# Patient Record
Sex: Male | Born: 1950 | Race: White | Hispanic: No | Marital: Married | Smoking: Never smoker
Health system: Southern US, Community
[De-identification: ages and names within clinical notes are randomized; demographics above are authoritative.]

## PROBLEM LIST (undated history)

## (undated) DIAGNOSIS — T148XXA Other injury of unspecified body region, initial encounter: Secondary | ICD-10-CM

## (undated) DIAGNOSIS — M1990 Unspecified osteoarthritis, unspecified site: Secondary | ICD-10-CM

## (undated) DIAGNOSIS — G4733 Obstructive sleep apnea (adult) (pediatric): Secondary | ICD-10-CM

## (undated) DIAGNOSIS — E785 Hyperlipidemia, unspecified: Secondary | ICD-10-CM

## (undated) DIAGNOSIS — K219 Gastro-esophageal reflux disease without esophagitis: Secondary | ICD-10-CM

## (undated) DIAGNOSIS — K222 Esophageal obstruction: Secondary | ICD-10-CM

## (undated) DIAGNOSIS — N200 Calculus of kidney: Secondary | ICD-10-CM

## (undated) DIAGNOSIS — G4730 Sleep apnea, unspecified: Secondary | ICD-10-CM

## (undated) DIAGNOSIS — I10 Essential (primary) hypertension: Secondary | ICD-10-CM

## (undated) HISTORY — DX: Esophageal obstruction: K22.2

## (undated) HISTORY — DX: Essential (primary) hypertension: I10

## (undated) HISTORY — DX: Hyperlipidemia, unspecified: E78.5

## (undated) HISTORY — DX: Sleep apnea, unspecified: G47.30

## (undated) HISTORY — PX: OTHER SURGICAL HISTORY: SHX169

## (undated) HISTORY — DX: Unspecified osteoarthritis, unspecified site: M19.90

## (undated) HISTORY — DX: Calculus of kidney: N20.0

## (undated) HISTORY — DX: Other injury of unspecified body region, initial encounter: T14.8XXA

## (undated) HISTORY — DX: Obstructive sleep apnea (adult) (pediatric): G47.33

---

## 1977-12-26 HISTORY — PX: KIDNEY STONE SURGERY: SHX686

## 2004-11-04 ENCOUNTER — Emergency Department (HOSPITAL_COMMUNITY): Admission: EM | Admit: 2004-11-04 | Discharge: 2004-11-04 | Payer: Self-pay | Admitting: Emergency Medicine

## 2008-12-26 HISTORY — PX: ESOPHAGUS SURGERY: SHX626

## 2010-09-16 ENCOUNTER — Encounter (INDEPENDENT_AMBULATORY_CARE_PROVIDER_SITE_OTHER): Payer: Self-pay | Admitting: *Deleted

## 2010-09-29 ENCOUNTER — Encounter (INDEPENDENT_AMBULATORY_CARE_PROVIDER_SITE_OTHER): Payer: Self-pay | Admitting: *Deleted

## 2010-09-30 ENCOUNTER — Encounter (INDEPENDENT_AMBULATORY_CARE_PROVIDER_SITE_OTHER): Payer: Self-pay | Admitting: *Deleted

## 2010-09-30 ENCOUNTER — Ambulatory Visit: Payer: Self-pay | Admitting: Internal Medicine

## 2010-10-14 ENCOUNTER — Ambulatory Visit: Payer: Self-pay | Admitting: Internal Medicine

## 2010-10-14 HISTORY — PX: COLONOSCOPY: SHX174

## 2010-10-18 ENCOUNTER — Encounter: Payer: Self-pay | Admitting: Internal Medicine

## 2011-01-27 NOTE — Procedures (Signed)
Summary: Colonoscopy  Patient: [name] Note: All result statuses are Final unless otherwise noted.  Tests: (1) Colonoscopy (COL)   COL Colonoscopy           DONE     [facility]     [address]     [place], [place]  [address]           COLONOSCOPY PROCEDURE REPORT           PATIENT:  Austin Ortiz, Austin Ortiz  MR#:  [id number]     BIRTHDATE:  26-May-1951, 59 yrs. old  GENDER:  male     ENDOSCOPIST:  Hedwig Morton. Juanda Chance, MD     REF. BY:  Rudi Heap, M.D.     PROCEDURE DATE:  10/14/2010     PROCEDURE:  Colonoscopy 34742     ASA CLASS:  Class II     INDICATIONS:  Routine Risk Screening     MEDICATIONS:   Versed 9 mg, Fentanyl 75 mcg           DESCRIPTION OF PROCEDURE:   After the risks benefits and     alternatives of the procedure were thoroughly explained, informed     consent was obtained.  Digital rectal exam was performed and     revealed no rectal masses.   The LB [id number]-[id number] endoscope     was introduced through the anus and advanced to the cecum, which     was identified by both the appendix and ileocecal valve, without     limitations.  The quality of the prep was good, using MoviPrep.     The instrument was then slowly withdrawn as the colon was fully     examined.     <<PROCEDUREIMAGES>>           FINDINGS:  A sessile polyp was found at the ileocecal valve.     unsure if it is polyp or a thickened fold With standard forceps,     biopsy was obtained and sent to pathology (see image1 and image2).     This was otherwise a normal examination of the colon (see image1,     image3, and image4).   Retroflexed views in the rectum revealed no     abnormalities.    The scope was then withdrawn from the patient     and the procedure completed.           COMPLICATIONS:  None     ENDOSCOPIC IMPRESSION:     1) Sessile polyp at the ileocecal valve     2) Otherwise normal examination     unsure if it is a polyp or a thickened fold     RECOMMENDATIONS:     1) Await  biopsy results     2) High fiber diet.     REPEAT EXAM:  In 10 year(s) for.           ______________________________     Hedwig Morton. Juanda Chance, MD           CC:           n.     eSIGNED:   Hedwig Morton. Brodie at 10/14/2010 03:12 PM           Gwynneth Macleod, [id number]  Note: An exclamation mark (!) indicates a result that was not dispersed into the flowsheet. Document Creation Date: 10/14/2010 3:12 PM _______________________________________________________________________  (1) Order result status: Final Collection or observation date-time: 10/14/2010 15:06 Requested date-time:  Receipt date-time:  Reported date-time:  Referring Physician:   Ordering Physician: Lina Sar [phone]) Specimen Source:  Source: Launa Grill Order Number: [id number] Lab site:   Appended Document: Colonoscopy     Procedures Next Due Date:    Colonoscopy: 10/2020

## 2011-01-27 NOTE — Letter (Signed)
Summary: [facility] - Inpatient Instructions  [facility]  [address] [place], [place] [address]   Phone: [phone]  Fax: [phone]       MOURAD CWIKLA    10/06/1951    MRN: [id number]        Procedure Day /Date: Thursday 10-14-10     Arrival Time: 1:00 p.m.     Procedure Time: 2:00 p.m.     Location of Procedure:                     [facility] (4th Floor)   PREPARATION FOR COLONOSCOPY WITH MOVIPREP   Starting 5 days prior to your procedure  10-09-10  do not eat nuts, seeds, popcorn, corn, beans, peas,  salads, or any raw vegetables.  Do not take any fiber supplements (e.g. Metamucil, Citrucel, and Benefiber).  THE DAY BEFORE YOUR PROCEDURE         DATE:  10-13-10   DAY:  Wednesday  1.  Drink clear liquids the entire day-NO SOLID FOOD  2.  Do not drink anything colored red or purple.  Avoid juices with pulp.  No orange juice.  3.  Drink at least 64 oz. (8 glasses) of fluid/clear liquids during the day to prevent dehydration and help the prep work efficiently.  CLEAR LIQUIDS INCLUDE: Water Jello Ice Popsicles Tea (sugar ok, no milk/cream) Powdered fruit flavored drinks Coffee (sugar ok, no milk/cream) Gatorade Juice: apple, white grape, white cranberry  Lemonade Clear bullion, consomm, broth Carbonated beverages (any kind) Strained chicken noodle soup Hard Candy                             4.  In the morning, mix first dose of MoviPrep solution:    Empty 1 Pouch A and 1 Pouch B into the disposable container    Add lukewarm drinking water to the top line of the container. Mix to dissolve    Refrigerate (mixed solution should be used within 24 hrs)  5.  Begin drinking the prep at 5:00 p.m. The MoviPrep container is divided by 4 marks.   Every 15 minutes drink the solution down to the next mark (approximately 8 oz) until the full liter is complete.   6.  Follow completed prep with 16 oz of clear liquid of your choice (Nothing red or  purple).  Continue to drink clear liquids until bedtime.  7.  Before going to bed, mix second dose of MoviPrep solution:    Empty 1 Pouch A and 1 Pouch B into the disposable container    Add lukewarm drinking water to the top line of the container. Mix to dissolve    Refrigerate  THE DAY OF YOUR PROCEDURE      DATE:  10-14-10  DAY:  Thursday  Beginning at  9:00 a.m. (5 hours before procedure):         1. Every 15 minutes, drink the solution down to the next mark (approx 8 oz) until the full liter is complete.  2. Follow completed prep with 16 oz. of clear liquid of your choice.    3. You may drink clear liquids until 12:00 p.m.  (2 HOURS BEFORE PROCEDURE).   MEDICATION INSTRUCTIONS  Unless otherwise instructed, you should take regular prescription medications with a small sip of water   as early as possible the morning of your procedure.  Diabetic patients - see separate instructions.  Stop taking Plavix  or Aggrenox on  _  _  (7 days before procedure).     Stop taking Coumadin on  _ _  (5 days before procedure).  Additional medication instructions: _         OTHER INSTRUCTIONS  You will need a responsible adult at least 59 years of age to accompany you and drive you home.   This person must remain in the waiting room during your procedure.  Wear loose fitting clothing that is easily removed.  Leave jewelry and other valuables at home.  However, you may wish to bring a book to read or  an iPod/MP3 player to listen to music as you wait for your procedure to start.  Remove all body piercing jewelry and leave at home.  Total time from sign-in until discharge is approximately 2-3 hours.  You should go home directly after your procedure and rest.  You can resume normal activities the  day after your procedure.  The day of your procedure you should not:   Drive   Make legal decisions   Operate machinery   Drink alcohol   Return to work  You will receive  specific instructions about eating, activities and medications before you leave.    The above instructions have been reviewed and explained to me by   _______________________    I fully understand and can verbalize these instructions _____________________________ Date _________

## 2011-01-27 NOTE — Miscellaneous (Signed)
Summary: [facility] PREP  Clinical Lists Changes  Medications: Added new medication of MOVIPREP 100 GM  SOLR (PEG-KCL-NACL-NASULF-NA ASC-C) As per prep instructions. - Signed Rx of MOVIPREP 100 GM  SOLR (PEG-KCL-NACL-NASULF-NA ASC-C) As per prep instructions.;  #1 x 0;  Signed;  Entered by: Karl Bales RN;  Authorized by: Hart Carwin MD;  Method used: Electronically to [facility] [address], [place], [place], [place]  [address], Ph: [phone], Fax: [phone] Observations: Added new observation of NKA: T (09/30/2010 9:53)    Prescriptions: MOVIPREP 100 GM  SOLR (PEG-KCL-NACL-NASULF-NA ASC-C) As per prep instructions.  #1 x 0   Entered by:   Karl Bales RN   Authorized by:   Hart Carwin MD   Signed by:   Karl Bales RN on 09/30/2010   Method used:   Electronically to        [facility] [address] (retail)       [address]       [place], [place]  [address]       Ph: [phone]       Fax: [phone]   RxID:   [id number]   Appended Document: MIRALAX PREP Pt had Moviprep sent to pharmacy, not Moviprep

## 2011-01-27 NOTE — Letter (Signed)
Summary: Diabetic Instructions  [facility]  [address]   [place], [place] [address]   Phone: [phone]  Fax: [phone]    Austin Ortiz 08/06/1951 MRN: [id number]   x     ORAL DIABETIC MEDICATION INSTRUCTIONS  The day before your procedure:   Take your diabetic pill as you do normally  The day of your procedure:   Do not take your diabetic pill    We will check your blood sugar levels during the admission process and again in Recovery before discharging you home  ________________________________________________________________________

## 2011-01-27 NOTE — Letter (Signed)
Summary: Patient Notice- Polyp Results  [facility]  [address] [place], [place] [address]   Phone: [phone]  Fax: [phone]        October 18, 2010 MRN: [id number]    [facility] [address] [place], [place]  [address]    Dear [name],  I am pleased to inform you that the colon polyp(s) removed during your recent colonoscopy was (were) found to be benign (no cancer detected) upon pathologic examination.The polyp consisted of a normal colon tissue,( not precancerous)   I recommend you have a repeat colonoscopy examination in 10_ years to look for recurrent polyps, as having colon polyps increases your risk for having recurrent polyps or even colon cancer in the future.  Should you develop new or worsening symptoms of abdominal pain, bowel habit changes or bleeding from the rectum or bowels, please schedule an evaluation with either your primary care physician or with me.  Additional information/recommendations:  _x_ No further action with gastroenterology is needed at this time. Please      follow-up with your primary care physician for your other healthcare      needs.  __ Please call [phone] to schedule a return visit to review your      situation.  __ Please keep your follow-up visit as already scheduled.  __ Continue treatment plan as outlined the day of your exam.  Please call us if you are having persistent problems or have questions about your condition that have not been fully answered at this time.  Sincerely,  Hart Carwin MD  This letter has been electronically signed by your physician.  Appended Document: Patient Notice- Polyp Results letter mailed

## 2011-01-27 NOTE — Letter (Signed)
Summary: Pre Visit Letter Revised  [facility]  [address] [place], [place] [address]   Phone: [phone]  Fax: [phone]        09/16/2010 MRN: [id number]   [facility] [address] [place], [place]  [address]              Welcome to the Gastroenterology Division at [facility]    You are scheduled to see a nurse for your pre-procedure visit on September 30, 2010 at 9:30am on the 3rd floor at [facility], [address]  We ask that you try to arrive at our office 15 minutes prior to your appointment time to allow for check-in.  Please take a minute to review the attached form.  If you answer "Yes" to one or more of the questions on the first page, we ask that you call the person listed at your earliest opportunity.  If you answer "No" to all of the questions, please complete the rest of the form and bring it to your appointment.    Your nurse visit will consist of discussing your medical and surgical history, your immediate family medical history, and your medications.   If you are unable to list all of your medications on the form, please bring the medication bottles to your appointment and we will list them.  We will need to be aware of both prescribed and over the counter drugs.  We will need to know exact dosage information as well.    Please be prepared to read and sign documents such as consent forms, a financial agreement, and acknowledgement forms.  If necessary, and with your consent, a friend or relative is welcome to sit-in on the nurse visit with you.  Please bring your insurance card so that we may make a copy of it.  If your insurance requires a referral to see a specialist, please bring your referral form from your primary care physician.  No co-pay is required for this nurse visit.     If you cannot keep your appointment, please call [phone] to cancel or reschedule prior to your appointment date.  This allows [place] the opportunity  to schedule an appointment for another patient in need of care.    Thank you for choosing [facility] for your medical needs.  We appreciate the opportunity to care for you.  Please visit [place] at our website  to learn more about our practice.  Sincerely, The Gastroenterology Division

## 2011-03-09 LAB — GLUCOSE, CAPILLARY

## 2011-05-27 ENCOUNTER — Encounter: Payer: Self-pay | Admitting: Physician Assistant

## 2013-04-15 ENCOUNTER — Telehealth: Payer: Self-pay | Admitting: Physician Assistant

## 2013-04-15 DIAGNOSIS — R1310 Dysphagia, unspecified: Secondary | ICD-10-CM

## 2013-04-16 NOTE — Telephone Encounter (Signed)
Please advise.  Need an appointment or could you do a referral based on the symptoms? Austin Ortiz was prior provider. Thanks.

## 2013-04-17 NOTE — Telephone Encounter (Signed)
Can we do this referral without pt being seen- dr Jarold Motto gi dwm to address due to this being a [facility] pt

## 2013-04-18 ENCOUNTER — Encounter: Payer: Self-pay | Admitting: *Deleted

## 2013-04-18 ENCOUNTER — Ambulatory Visit (INDEPENDENT_AMBULATORY_CARE_PROVIDER_SITE_OTHER): Payer: BC Managed Care – PPO | Admitting: Gastroenterology

## 2013-04-18 ENCOUNTER — Telehealth: Payer: Self-pay | Admitting: Internal Medicine

## 2013-04-18 VITALS — BP 130/70 | HR 80 | Ht 65.75 in | Wt 217.4 lb

## 2013-04-18 DIAGNOSIS — R1319 Other dysphagia: Secondary | ICD-10-CM

## 2013-04-18 NOTE — Progress Notes (Signed)
Reviewed and agree with EGD/dil for solid food dysphagia. Colon 2011- polypoid mucosa, recall 10 years

## 2013-04-18 NOTE — Progress Notes (Signed)
04/18/2013 Austin Ortiz [id number] 09-08-1951   History of Present Illness:  Patient is a pleasant 62 year old male who presents to our office today with complaints of solid food dysphagia.  Says that it has been occurring for quite some time but has become much more frequent.  Says that it used to feel like it was slow to go down, but now it actually gets hung up before moving into his stomach.  Says that he can usually walk around and it will eventually move, but Sunday he ate some ham and it was stuck for 3 hours before it finally passed.  Does not have a problem swallowing liquids alone.  Does not have to vomit or bring food back up.  Does not choke or cough.  Current Medications, Allergies, Past Medical History, Past Surgical History, Family History and Social History were reviewed in [employer] record.   Physical Exam: BP 130/70  Pulse 80  Ht 5' 5.75" (1.67 m)  Wt 217 lb 6.4 oz (98.612 kg)  BMI 35.36 kg/m2 General: Well developed, male in no acute distress Head: Normocephalic and atraumatic Eyes:  sclerae anicteric, conjunctiva pink  Ears: Normal auditory acuity Lungs: Clear throughout to auscultation Heart: Regular rate and rhythm Abdomen: Soft, non-tender and non-distended. No masses, no hepatomegaly. Normal bowel sounds. Musculoskeletal: Symmetrical with no gross deformities  Extremities: No edema  Neurological: Alert oriented x 4, grossly nonfocal Psychological:  Alert and cooperative. Normal mood and affect  Assessment and Recommendations: -Dysphagia, mostly to solid food.  Rule out esophageal stricture vs Schatzki's ring vs EoE vs malignancy and other etiologies.  Will schedule EGD with possible dilation.

## 2013-04-18 NOTE — Patient Instructions (Addendum)
You have been scheduled for an endoscopy with propofol. Please follow written instructions given to you at your visit today. If you use inhalers (even only as needed), please bring them with you on the day of your procedure. Your physician has requested that you go to www.startemmi.com and enter the access code given to you at your visit today. This web site gives a general overview about your procedure. However, you should still follow specific instructions given to you by our office regarding your preparation for the procedure.  NO ORAL DIABETIC MEDS THE MORNING OF THE PROCEDURE

## 2013-04-18 NOTE — Telephone Encounter (Signed)
Scheduled patient today with Doug Sou, PA at 3:30 PM.

## 2013-04-18 NOTE — Telephone Encounter (Signed)
Left a message for Carlon to call me

## 2013-04-19 ENCOUNTER — Encounter: Payer: Self-pay | Admitting: Internal Medicine

## 2013-04-19 ENCOUNTER — Other Ambulatory Visit: Payer: Self-pay | Admitting: Internal Medicine

## 2013-04-19 ENCOUNTER — Ambulatory Visit (AMBULATORY_SURGERY_CENTER): Payer: BC Managed Care – PPO | Admitting: Internal Medicine

## 2013-04-19 VITALS — BP 127/75 | HR 62 | Temp 98.0°F | Resp 17 | Ht 66.0 in | Wt 217.0 lb

## 2013-04-19 DIAGNOSIS — K222 Esophageal obstruction: Secondary | ICD-10-CM

## 2013-04-19 DIAGNOSIS — K2100 Gastro-esophageal reflux disease with esophagitis, without bleeding: Secondary | ICD-10-CM

## 2013-04-19 DIAGNOSIS — K219 Gastro-esophageal reflux disease without esophagitis: Secondary | ICD-10-CM

## 2013-04-19 DIAGNOSIS — R1319 Other dysphagia: Secondary | ICD-10-CM

## 2013-04-19 DIAGNOSIS — K209 Esophagitis, unspecified without bleeding: Secondary | ICD-10-CM

## 2013-04-19 MED ORDER — OMEPRAZOLE 40 MG PO CPDR: 40.0000 mg | DELAYED_RELEASE_CAPSULE | Freq: Every day | ORAL | Status: DC

## 2013-04-19 MED ORDER — SODIUM CHLORIDE 0.9 % IV SOLN: 500.0000 mL | INTRAVENOUS | Status: DC

## 2013-04-19 NOTE — Progress Notes (Signed)
Patient did not experience any of the following events: a burn prior to discharge; a fall within the facility; wrong site/side/patient/procedure/implant event; or a hospital transfer or hospital admission upon discharge from the facility. (G8907) Patient did not have preoperative order for IV antibiotic SSI prophylaxis. (G8918)  

## 2013-04-19 NOTE — Patient Instructions (Addendum)
Discharge instructions given with verbal understanding. Handouts on a hiatal hernia,stricture,esophagitis, and a dilatation diet given. Resume previous medications. YOU HAD AN ENDOSCOPIC PROCEDURE TODAY AT THE [facility]: Refer to the procedure report that was given to you for any specific questions about what was found during the examination.  If the procedure report does not answer your questions, please call your gastroenterologist to clarify.  If you requested that your care partner not be given the details of your procedure findings, then the procedure report has been included in a sealed envelope for you to review at your convenience later.  YOU SHOULD EXPECT: Some feelings of bloating in the abdomen. Passage of more gas than usual.  Walking can help get rid of the air that was put into your GI tract during the procedure and reduce the bloating. If you had a lower endoscopy (such as a colonoscopy or flexible sigmoidoscopy) you may notice spotting of blood in your stool or on the toilet paper. If you underwent a bowel prep for your procedure, then you may not have a normal bowel movement for a few days.  DIET: Your first meal following the procedure should be a light meal and then it is ok to progress to your normal diet.  A half-sandwich or bowl of soup is an example of a good first meal.  Heavy or fried foods are harder to digest and may make you feel nauseous or bloated.  Likewise meals heavy in dairy and vegetables can cause extra gas to form and this can also increase the bloating.  Drink plenty of fluids but you should avoid alcoholic beverages for 24 hours.  ACTIVITY: Your care partner should take you home directly after the procedure.  You should plan to take it easy, moving slowly for the rest of the day.  You can resume normal activity the day after the procedure however you should NOT DRIVE or use heavy machinery for 24 hours (because of the sedation medicines used during the  test).    SYMPTOMS TO REPORT IMMEDIATELY: A gastroenterologist can be reached at any hour.  During normal business hours, 8:30 AM to 5:00 PM Monday through Friday, call [phone].  After hours and on weekends, please call the GI answering service at [phone] who will take a message and have the physician on call contact you.   Following upper endoscopy (EGD)  Vomiting of blood or coffee ground material  New chest pain or pain under the shoulder blades  Painful or persistently difficult swallowing  New shortness of breath  Fever of 100F or higher  Black, tarry-looking stools  FOLLOW UP: If any biopsies were taken you will be contacted by phone or by letter within the next 1-3 weeks.  Call your gastroenterologist if you have not heard about the biopsies in 3 weeks.  Our staff will call the home number listed on your records the next business day following your procedure to check on you and address any questions or concerns that you may have at that time regarding the information given to you following your procedure. This is a courtesy call and so if there is no answer at the home number and we have not heard from you through the emergency physician on call, we will assume that you have returned to your regular daily activities without incident.  SIGNATURES/CONFIDENTIALITY: You and/or your care partner have signed paperwork which will be entered into your electronic medical record.  These signatures attest to the fact  that that the information above on your After Visit Summary has been reviewed and is understood.  Full responsibility of the confidentiality of this discharge information lies with you and/or your care-partner.

## 2013-04-19 NOTE — Op Note (Signed)
[facility] [address] [place], [address]   ENDOSCOPY PROCEDURE REPORT  PATIENT: [name], [name]  MR#: [id number] BIRTHDATE: [birth year] , 61  yrs. old GENDER: Male ENDOSCOPIST: Hart Carwin, MD REFERRED BY:  Rudi Heap, M.D. PROCEDURE DATE:  04/19/2013 PROCEDURE:  EGD w/ biopsy and Savary dilation of esophagus ASA CLASS:     Class II INDICATIONS:  Dysphagia. MEDICATIONS: MAC sedation, administered by CRNA and propofol (Diprivan) 200mg  IV TOPICAL ANESTHETIC: Cetacaine Spray  DESCRIPTION OF PROCEDURE: After the risks benefits and alternatives of the procedure were thoroughly explained, informed consent was obtained.  The [facility] [id number] [id number] endoscope was introduced through the mouth and advanced to the second portion of the duodenum. Without limitations.  The instrument was slowly withdrawn as the mucosa was fully examined.      Esophagus : endoscope passed through the posterior pharynx into the esophagus without difficulty. Esophageal mucosa appeared normal. There was a stricture at the level of 35 cm from the incisors which allowed the endoscope to traverse into a hiatal hernia.hiatal hernia extended from 35-39 cm from the incisors and was nonreducible Diameter of the stricture was about 14 mm. No multiple erosions within the stricture consistent with acute esophagitis. Multiple biopsies were taken from the stricture to rule out Barrett's esophagus. Guidewire was placed into the stomach and Savary dilator was passed through the stricture using 14, 15, 16, and 17 mm dilators. There was small amount of blood on the last dilator  Stomach: Gastric mucosa appeared normal it was slightly erythematous but there were no erosions. Retroflexion of the endoscope revealed normal fundus and cardia. Gastric outlet was normal  Duodenum: Duodenal bulb and descending duodenum was normal endoscope was then retracted and stomach decompressed[         The scope  was then withdrawn from the patient and the procedure completed.  COMPLICATIONS: There were no complications. ENDOSCOPIC IMPRESSION: benign-appearing mild esophageal stricture, status post dilation to 17 mm Grade 2 esophagitis likely due to reflux. Status post biopsies to rule out Barrett's esophagus 4 cm nonreducible hiatal hernia  RECOMMENDATIONS: Await biopsy results Antireflux measures Continue proton pump inhibitor on a daily basis REPEAT EXAM: for EGD pending biopsy results.  eSigned:  Hart Carwin, MD 04/19/2013 9:42 AM   CC:  PATIENT NAME:  Austin Ortiz, Austin Ortiz MR#: [id number]

## 2013-04-19 NOTE — Progress Notes (Signed)
Called to room to assist during endoscopic procedure.  Patient ID and intended procedure confirmed with present staff. Received instructions for my participation in the procedure from the performing physician. ewm 

## 2013-04-22 ENCOUNTER — Telehealth: Payer: Self-pay | Admitting: *Deleted

## 2013-04-22 HISTORY — PX: UPPER GASTROINTESTINAL ENDOSCOPY: SHX188

## 2013-04-22 NOTE — Telephone Encounter (Signed)
  Follow up Call-  Call back number 04/19/2013  Post procedure Call Back phone  # [phone]  Permission to leave phone message No     Patient questions:  Do you have a fever, pain , or abdominal swelling? no Pain Score  0 *  Have you tolerated food without any problems? yes  Have you been able to return to your normal activities? yes  Do you have any questions about your discharge instructions: Diet   no Medications  no Follow up visit  no  Do you have questions or concerns about your Care? no  Actions: * If pain score is 4 or above: No action needed, pain <4.

## 2013-04-23 ENCOUNTER — Encounter: Payer: Self-pay | Admitting: Internal Medicine

## 2013-05-13 ENCOUNTER — Ambulatory Visit (INDEPENDENT_AMBULATORY_CARE_PROVIDER_SITE_OTHER): Payer: BC Managed Care – PPO | Admitting: Family Medicine

## 2013-05-13 ENCOUNTER — Encounter: Payer: Self-pay | Admitting: Family Medicine

## 2013-05-13 VITALS — BP 140/77 | HR 72 | Temp 97.6°F | Ht 66.0 in | Wt 217.0 lb

## 2013-05-13 DIAGNOSIS — Z0289 Encounter for other administrative examinations: Secondary | ICD-10-CM

## 2013-05-13 DIAGNOSIS — R0681 Apnea, not elsewhere classified: Secondary | ICD-10-CM

## 2013-05-13 DIAGNOSIS — Z0000 Encounter for general adult medical examination without abnormal findings: Secondary | ICD-10-CM

## 2013-05-13 DIAGNOSIS — R0683 Snoring: Secondary | ICD-10-CM

## 2013-05-13 LAB — POCT URINALYSIS DIPSTICK
Bilirubin, UA: NEGATIVE
Blood, UA: NEGATIVE
Glucose, UA: NEGATIVE
Ketones, UA: NEGATIVE
Leukocytes, UA: NEGATIVE
Nitrite, UA: NEGATIVE
Spec Grav, UA: 1.01
Urobilinogen, UA: NEGATIVE
pH, UA: 6

## 2013-05-13 NOTE — Progress Notes (Signed)
Patient ID: Austin Ortiz, male   DOB: July 02, 1951, 62 y.o.   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  Patient is here for follow up of Diabetes Mellitus: Symptoms of DM: Denies Nocturia ,Denies Urinary Frequency , denies Blurred vision ,deniesDizziness,denies.Dysuria,denies paresthesias, denies extremity pain or ulcers.[name] chest pain. has had an annual eye exam. do check the feet. Does check CBGs. Average CBG:150-200 Denies episodes of hypoglycemia. Does have an emergency hypoglycemic plan. admits toCompliance with medications. Denies Problems with medications. Last HGBA1C in 03/04/2013 =7.3  Past Medical History  Diagnosis Date  . Diabetes mellitus   . Hyperlipidemia   . Kidney stones   . Foreign body in subcutaneous tissue     metal off of a machine, epigastric abd. area  . Esophageal stricture    Past Surgical History  Procedure Laterality Date  . Neg hx     Current Outpatient Prescriptions on File Prior to Visit  Medication Sig Dispense Refill  . GLIMEPIRIDE PO Take 4 mg by mouth daily.       [name] lisinopril (PRINIVIL,ZESTRIL) 10 MG tablet       . meloxicam (MOBIC) 15 MG tablet Take 15 mg by mouth daily.        . metFORMIN (GLUCOPHAGE) 1000 MG tablet       . omeprazole (PRILOSEC) 40 MG capsule Take 1 capsule (40 mg total) by mouth at bedtime.  30 capsule  11  . sitaGLIPtan (JANUVIA) 50 MG tablet Take 50 mg by mouth daily.         No current facility-administered medications on file prior to visit.   No Known Allergies  There is no immunization history on file for this patient. History   Social History  . Marital Status: Married    Spouse Name: [name]    Number of Children: 3  . Years of Education: N/A   Occupational History  . self employed- farmer    Social History Main Topics  . Smoking status: Never Smoker   . Smokeless tobacco: Never Used  . Alcohol Use: No  . Drug Use: No  . Sexually Active: Not on file   Other Topics  Concern  . Not on file   Social History Narrative  . No narrative on file      PMH/PSH: reviewed/updated in Epic  SH/FH: reviewed/updated in Epic  Allergies: reviewed/updated in Epic  Medications: reviewed/updated in Epic  Immunizations: reviewed/updated in Epic  ROS: As above in the HPI. Does snore and told he stops breathing in the past. Never had this evaluated. All other systems are stable or negative.  OBJECTIVE: APPEARANCE:  Patient in no acute distress.The patient appeared well nourished and normally developed. Acyanotic. Waist: VITAL SIGNS:  SKIN: warm and  Dry without overt rashes, tattoos and scars  HEAD and Neck: without JVD, Head and scalp: normal Eyes:No scleral icterus. Fundi normal, eye movements normal. Ears: Auricle normal, canal normal, Tympanic membranes normal, insufflation normal. Nose: normal Throat: normal Neck & thyroid: normal  CHEST & LUNGS: Chest wall: normal Lungs: Clear  CVS: Reveals the PMI to be normally located. Regular rhythm, First and Second Heart sounds are normal,  absence of murmurs, rubs or gallops. Peripheral vasculature: Radial pulses: normal Dorsal pedis pulses: normal Posterior pulses: normal  ABDOMEN:  Appearance: normal Benign,, no organomegaly, no masses, no Abdominal Aortic enlargement. No Guarding , no rebound. No Bruits. Bowel sounds: normal  RECTAL: N/A GU: N/A  EXTREMETIES: nonedematous. Both Femoral and Pedal pulses are normal.  MUSCULOSKELETAL:  Spine: normal Joints: intact  NEUROLOGIC: oriented to time,place and person; nonfocal. Strength is normal Sensory is normal Reflexes are normal Cranial Nerves are normal.  ASSESSMENT: Routine general medical examination at a health care facility - Plan: POCT urinalysis dipstick  Snoring - Plan: Ambulatory referral to Sleep Studies, CANCELED: Nocturnal polysomnography (NPSG)  Apnea - Plan: Ambulatory referral to Sleep Studies, CANCELED:  Nocturnal polysomnography (NPSG)    PLAN:  Not passed today on basis of need for further evaluation and treatment for sleep apnea.  Orders Placed This Encounter  Procedures  . Ambulatory referral to Sleep Studies    Referral Priority:  Routine    Referral Type:  Consultation    Referral Reason:  Specialty Services Required    Number of Visits Requested:  1  . POCT urinalysis dipstick   Results for orders placed in visit on 05/13/13  POCT URINALYSIS DIPSTICK      Result Value Range   Color, UA straw     Clarity, UA clear     Glucose, UA negative     Bilirubin, UA negative     Ketones, UA negative     Spec Grav, UA 1.010     Blood, UA negative     pH, UA 6.0     Protein, UA trace     Urobilinogen, UA negative     Nitrite, UA negative     Leukocytes, UA Negative     Meds ordered this encounter  Medications  . simvastatin (ZOCOR) 40 MG tablet    Sig: Take 40 mg by mouth every evening.  . niacin (NIASPAN) 1000 MG CR tablet    Sig: Take 1,000 mg by mouth at bedtime.  . Omega-3 Fatty Acids (FISH OIL) 600 MG CAPS    Sig: Take 1,200 mg by mouth daily.  . Glucosamine-Chondroitin-Vit D3 1500-1200-800 MG-MG-UNIT PACK    Sig: Take 1 capsule by mouth.  . Multiple Vitamin (MULTIVITAMIN) tablet    Sig: Take 1 tablet by mouth daily.  RTc after sleep study and treatment.  Truth Barot P. Modesto Charon, M.D.

## 2013-05-15 ENCOUNTER — Other Ambulatory Visit (HOSPITAL_COMMUNITY): Payer: Self-pay

## 2013-05-15 DIAGNOSIS — R0989 Other specified symptoms and signs involving the circulatory and respiratory systems: Secondary | ICD-10-CM

## 2013-05-15 DIAGNOSIS — R0609 Other forms of dyspnea: Secondary | ICD-10-CM

## 2013-05-15 DIAGNOSIS — R0681 Apnea, not elsewhere classified: Secondary | ICD-10-CM

## 2013-05-15 NOTE — Progress Notes (Signed)
Quick Note:  Labs normal. No change in plans , awaiting the sleep apnea test to be done as part of the DOT evaluation.  ______

## 2013-05-22 ENCOUNTER — Ambulatory Visit: Payer: BC Managed Care – PPO | Attending: Family Medicine | Admitting: Sleep Medicine

## 2013-05-22 DIAGNOSIS — G4733 Obstructive sleep apnea (adult) (pediatric): Secondary | ICD-10-CM | POA: Insufficient documentation

## 2013-05-22 DIAGNOSIS — Z6834 Body mass index (BMI) 34.0-34.9, adult: Secondary | ICD-10-CM | POA: Insufficient documentation

## 2013-05-22 DIAGNOSIS — R0989 Other specified symptoms and signs involving the circulatory and respiratory systems: Secondary | ICD-10-CM

## 2013-05-22 DIAGNOSIS — R0681 Apnea, not elsewhere classified: Secondary | ICD-10-CM

## 2013-05-27 NOTE — Procedures (Signed)
[facility] Marion Rosenberry A. Gerilyn Pilgrim, MD     [email or web address]        NAMEHYATT, CAPOBIANCO               ACCOUNT NO.:  [id number]  MEDICAL RECORD NO.:  [id number]          PATIENT TYPE:  OUT  LOCATION:  SLEEP LAB                     FACILITY:  [facility]  PHYSICIAN:  Ramiah Helfrich A. Gerilyn Pilgrim, M.D. DATE OF BIRTH:  September 12, 1951  DATE OF STUDY:  05/22/2013                           NOCTURNAL POLYSOMNOGRAM  REFERRING PHYSICIAN:  Thelma Barge P. Modesto Charon, M.D.  INDICATION:  A 62 year old man, who presents with witnessed apnea, loud snoring, and fatigue.   MEDICATIONS:  Simvastatin, niacin, fish oil, multivitamins, aspirin, glucosamine, chondroitin, insulin, metformin, glipizide, lisinopril, meloxicam, and omeprazole.  EPWORTH SLEEPINESS SCALE:  6.  BMI:  34.  ARCHITECTURAL SUMMARY:  This is a split night recording with initial portion being a diagnostic and second portion a titration recording. The total recording time is 377 minutes.  Sleep efficiency is 71%. Sleep latency is 14 minutes.  The REM latency is calculated by the computer certifier, but recalculated manually as 238.  RESPIRATORY SUMMARY:  Baseline oxygen saturation is 96, lowest saturation 81 during non-REM sleep.  Diagnostic AHI is 26.  The patient was placed on positive pressure between 6 and 8, did well on pressure of 8.  He was changed to AutoSet after that, but I think overall does well on regular positive pressure.  LIMB MOVEMENT SUMMARY:  PLM index 0.  ELECTROCARDIOGRAM SUMMARY:  Average heart rate is 61, with no significant dysrhythmias observed.  IMPRESSION:  Moderate obstructive sleep apnea syndrome, which responds well to a CPAP of 13.  RECOMMENDATIONS:  CPAP of 13.  Thank you for this referral.    Sinia Antosh A. Gerilyn Pilgrim, M.D.    KAD/MEDQ  D:  05/27/2013 09:17:50  T:  05/27/2013 09:56:04  Job:  [id number]

## 2013-05-28 DIAGNOSIS — G4733 Obstructive sleep apnea (adult) (pediatric): Secondary | ICD-10-CM

## 2013-05-28 HISTORY — DX: Obstructive sleep apnea (adult) (pediatric): G47.33

## 2013-05-29 ENCOUNTER — Other Ambulatory Visit: Payer: Self-pay | Admitting: Family Medicine

## 2013-05-29 ENCOUNTER — Encounter: Payer: Self-pay | Admitting: Family Medicine

## 2013-05-29 DIAGNOSIS — G4733 Obstructive sleep apnea (adult) (pediatric): Secondary | ICD-10-CM

## 2013-06-06 ENCOUNTER — Telehealth: Payer: Self-pay | Admitting: Family Medicine

## 2013-06-06 DIAGNOSIS — G4733 Obstructive sleep apnea (adult) (pediatric): Secondary | ICD-10-CM

## 2013-06-06 NOTE — Telephone Encounter (Signed)
Never got referral, still need one in epic

## 2013-06-06 NOTE — Telephone Encounter (Signed)
Pt notifed order for cpap has been initiated  Pt aware. Referral completed

## 2013-06-11 ENCOUNTER — Telehealth: Payer: Self-pay

## 2013-06-11 NOTE — Telephone Encounter (Signed)
Spoke to  pt and informed him will need appt for DOT with any provider since Dr Modesto Charon will be out of town and pt aware will need need CPAP documentation usage . Pt verbalized understanding

## 2013-06-11 NOTE — Telephone Encounter (Signed)
cpap ordered today

## 2013-06-11 NOTE — Telephone Encounter (Signed)
cpap ordered today through [facility]. Pt aware

## 2013-06-25 ENCOUNTER — Encounter: Payer: Self-pay | Admitting: Family Medicine

## 2013-06-25 ENCOUNTER — Ambulatory Visit (INDEPENDENT_AMBULATORY_CARE_PROVIDER_SITE_OTHER): Payer: BC Managed Care – PPO | Admitting: Family Medicine

## 2013-06-25 VITALS — BP 159/85 | HR 80 | Temp 98.7°F | Ht 66.0 in | Wt 216.4 lb

## 2013-06-25 DIAGNOSIS — G4733 Obstructive sleep apnea (adult) (pediatric): Secondary | ICD-10-CM

## 2013-06-25 DIAGNOSIS — I152 Hypertension secondary to endocrine disorders: Secondary | ICD-10-CM | POA: Insufficient documentation

## 2013-06-25 DIAGNOSIS — I10 Essential (primary) hypertension: Secondary | ICD-10-CM

## 2013-06-25 DIAGNOSIS — E119 Type 2 diabetes mellitus without complications: Secondary | ICD-10-CM

## 2013-06-25 DIAGNOSIS — E1159 Type 2 diabetes mellitus with other circulatory complications: Secondary | ICD-10-CM | POA: Insufficient documentation

## 2013-06-25 NOTE — Patient Instructions (Signed)

## 2013-06-25 NOTE — Progress Notes (Signed)
  Subjective:    Patient ID: Austin Ortiz, male    DOB: June 22, 1951, 62 y.o.   MRN: [id number]  HPI This 62 y.o. male presents for evaluation of cpap tx for osas.  Patient was recently seen for DOT exam and was found to have OSAS and he was referred for polysomnogram and was subsequently dx with OSAS and rx'd CPAP.  He has the CPAP stats back for compliancy for 80 hours and he has worn the CPAP for average of over 6 hours nightly.  He reports feeling better and more rested.  He has been losing some weight.  He has hx of DM and hypertension.   Review of Systems No chest pain, SOB, HA, dizziness, vision change, N/V, diarrhea, constipation, dysuria, urinary urgency or frequency, myalgias, arthralgias or rash.     Objective:   Physical Exam  Vital signs noted  Well developed well nourished male.  HEENT - Head atraumatic Normocephalic                Eyes - PERRLA, Conjuctiva - clear Sclera- Clear EOMI                Ears - EAC's Wnl TM's Wnl Gross Hearing WNL                Nose - Nares patent                 Throat - oropharanx wnl Respiratory - Lungs CTA bilateral Cardiac - RRR S1 and S2 without murmur GI - Abdomen soft Nontender and bowel sounds active x 4 Extremities - No edema. Neuro - Grossly intact.      Assessment & Plan:  Obstructive sleep apnea Continue current cpap tx.  Essential hypertension, benign Discussed with patient that by losing weight and staying compliant with cpap his bp will be better controlled and follow up in 2-3 months for follow up.  Diabetes - Continue current regimen and congratulated him on recent weight loss.  Follow up in 2-3 months.

## 2013-07-29 ENCOUNTER — Telehealth: Payer: Self-pay | Admitting: *Deleted

## 2013-07-29 NOTE — Telephone Encounter (Signed)
[facility] has requested that we completed prior authorization for omeprazole through patient's insurance. I have spoken to [place] at [place] and rx has been approved through 07/29/14. She has ran a successful test claim of the medication as well.

## 2013-08-29 ENCOUNTER — Encounter: Payer: Self-pay | Admitting: Family Medicine

## 2013-08-29 ENCOUNTER — Other Ambulatory Visit: Payer: BC Managed Care – PPO

## 2013-08-29 ENCOUNTER — Ambulatory Visit (INDEPENDENT_AMBULATORY_CARE_PROVIDER_SITE_OTHER): Payer: BC Managed Care – PPO | Admitting: Family Medicine

## 2013-08-29 VITALS — BP 129/73 | HR 66 | Temp 97.3°F | Wt 218.6 lb

## 2013-08-29 DIAGNOSIS — R1319 Other dysphagia: Secondary | ICD-10-CM

## 2013-08-29 DIAGNOSIS — M129 Arthropathy, unspecified: Secondary | ICD-10-CM

## 2013-08-29 DIAGNOSIS — M1990 Unspecified osteoarthritis, unspecified site: Secondary | ICD-10-CM

## 2013-08-29 DIAGNOSIS — K2100 Gastro-esophageal reflux disease with esophagitis, without bleeding: Secondary | ICD-10-CM

## 2013-08-29 DIAGNOSIS — I10 Essential (primary) hypertension: Secondary | ICD-10-CM

## 2013-08-29 DIAGNOSIS — E785 Hyperlipidemia, unspecified: Secondary | ICD-10-CM

## 2013-08-29 DIAGNOSIS — K209 Esophagitis, unspecified without bleeding: Secondary | ICD-10-CM

## 2013-08-29 DIAGNOSIS — G4733 Obstructive sleep apnea (adult) (pediatric): Secondary | ICD-10-CM

## 2013-08-29 DIAGNOSIS — E119 Type 2 diabetes mellitus without complications: Secondary | ICD-10-CM

## 2013-08-29 DIAGNOSIS — K222 Esophageal obstruction: Secondary | ICD-10-CM

## 2013-08-29 LAB — POCT GLYCOSYLATED HEMOGLOBIN (HGB A1C): Hemoglobin A1C: 8.2

## 2013-08-29 MED ORDER — NIACIN ER (ANTIHYPERLIPIDEMIC) 1000 MG PO TBCR: 1000.0000 mg | EXTENDED_RELEASE_TABLET | Freq: Every day | ORAL | Status: DC

## 2013-08-29 MED ORDER — LISINOPRIL 10 MG PO TABS: 10.0000 mg | ORAL_TABLET | Freq: Every day | ORAL | Status: DC

## 2013-08-29 MED ORDER — GLIMEPIRIDE 4 MG PO TABS: 4.0000 mg | ORAL_TABLET | Freq: Every day | ORAL | Status: DC

## 2013-08-29 MED ORDER — MELOXICAM 15 MG PO TABS: 15.0000 mg | ORAL_TABLET | Freq: Every day | ORAL | Status: DC

## 2013-08-29 MED ORDER — SIMVASTATIN 40 MG PO TABS: 40.0000 mg | ORAL_TABLET | Freq: Every evening | ORAL | Status: DC

## 2013-08-29 MED ORDER — SITAGLIPTIN PHOSPHATE 50 MG PO TABS: 50.0000 mg | ORAL_TABLET | Freq: Every day | ORAL | Status: DC

## 2013-08-29 MED ORDER — OMEPRAZOLE 40 MG PO CPDR: 40.0000 mg | DELAYED_RELEASE_CAPSULE | Freq: Every day | ORAL | Status: DC

## 2013-08-29 MED ORDER — METFORMIN HCL 1000 MG PO TABS: 1000.0000 mg | ORAL_TABLET | Freq: Every day | ORAL | Status: DC

## 2013-08-29 NOTE — Progress Notes (Signed)
Patient ID: Austin Ortiz, male   DOB: 02-12-1951, 62 y.o.   MRN: [id number] SUBJECTIVE: CC. Chief Complaint  Patient presents with  . Follow-up    3 month follow up   . Medication Refill    refill all meds     HPI: Patient is here for follow up of Diabetes Mellitus: Symptoms evaluated: Denies Nocturia ,Denies Urinary Frequency , denies Blurred vision ,deniesDizziness,denies.Dysuria,denies paresthesias, denies extremity pain or ulcers.[name] chest pain. has had an annual eye exam. do check the feet. Does check CBGs. Average CBG: not been checking Denies episodes of hypoglycemia. Does have an emergency hypoglycemic plan. admits toCompliance with medications. Denies Problems with medications.  Breakfast:cereal, cheerios or cornflakes Lunch:sandwich: bolgna, banana or ham Supper: meat and a couple of veggies.  Past Medical History  Diagnosis Date  . Diabetes mellitus   . Hyperlipidemia   . Kidney stones   . Foreign body in subcutaneous tissue     metal off of a machine, epigastric abd. area  . Esophageal stricture   . Obstructive sleep apnea 05/28/2013   Past Surgical History  Procedure Laterality Date  . Neg hx     History   Social History  . Marital Status: Married    Spouse Name: [name]    Number of Children: 3  . Years of Education: N/A   Occupational History  . self employed- farmer    Social History Main Topics  . Smoking status: Never Smoker   . Smokeless tobacco: Never Used  . Alcohol Use: No  . Drug Use: No  . Sexual Activity: Not on file   Other Topics Concern  . Not on file   Social History Narrative  . No narrative on file   Family History  Problem Relation Age of Onset  . Heart disease Mother   . Hypertension Mother   . Diabetes type II Paternal Aunt     x2  . Nephrolithiasis Maternal Aunt   . Breast cancer Maternal Aunt   . Heart attack Father     x 2  . Diabetes Maternal Aunt   . Diabetes Cousin     mat. side  . Colon cancer Neg  Hx    Current Outpatient Prescriptions on File Prior to Visit  Medication Sig Dispense Refill  . GLIMEPIRIDE PO Take 4 mg by mouth daily.       . Glucosamine-Chondroitin-Vit D3 1500-1200-800 MG-MG-UNIT PACK Take 1 capsule by mouth.      [name] lisinopril (PRINIVIL,ZESTRIL) 10 MG tablet Take 10 mg by mouth daily.       . meloxicam (MOBIC) 15 MG tablet Take 15 mg by mouth daily.        . metFORMIN (GLUCOPHAGE) 1000 MG tablet Take 1,000 mg by mouth daily with breakfast.       . Multiple Vitamin (MULTIVITAMIN) tablet Take 1 tablet by mouth daily.      . niacin (NIASPAN) 1000 MG CR tablet Take 1,000 mg by mouth at bedtime.      . Omega-3 Fatty Acids (FISH OIL) 600 MG CAPS Take 1,200 mg by mouth daily.      [name] omeprazole (PRILOSEC) 40 MG capsule Take 1 capsule (40 mg total) by mouth at bedtime.  30 capsule  11  . simvastatin (ZOCOR) 40 MG tablet Take 40 mg by mouth every evening.      . sitaGLIPtan (JANUVIA) 50 MG tablet Take 50 mg by mouth daily.         No current facility-administered medications on  file prior to visit.   No Known Allergies Immunization History  Administered Date(s) Administered  . Tdap 03/29/2012   Prior to Admission medications   Medication Sig Start Date End Date Taking? Authorizing Provider  aspirin 81 MG tablet Take 81 mg by mouth daily.   Yes Historical Provider, MD  GLIMEPIRIDE PO Take 4 mg by mouth daily.    Yes Historical Provider, MD  Glucosamine-Chondroitin-Vit D3 1500-1200-800 MG-MG-UNIT PACK Take 1 capsule by mouth.   Yes Historical Provider, MD  lisinopril (PRINIVIL,ZESTRIL) 10 MG tablet Take 10 mg by mouth daily.  04/10/13  Yes Historical Provider, MD  meloxicam (MOBIC) 15 MG tablet Take 15 mg by mouth daily.     Yes Historical Provider, MD  metFORMIN (GLUCOPHAGE) 1000 MG tablet Take 1,000 mg by mouth daily with breakfast.  03/08/13  Yes Historical Provider, MD  Multiple Vitamin (MULTIVITAMIN) tablet Take 1 tablet by mouth daily.   Yes Historical Provider, MD   niacin (NIASPAN) 1000 MG CR tablet Take 1,000 mg by mouth at bedtime.   Yes Historical Provider, MD  Omega-3 Fatty Acids (FISH OIL) 600 MG CAPS Take 1,200 mg by mouth daily.   Yes Historical Provider, MD  omeprazole (PRILOSEC) 40 MG capsule Take 1 capsule (40 mg total) by mouth at bedtime. 04/19/13  Yes Hart Carwin, MD  simvastatin (ZOCOR) 40 MG tablet Take 40 mg by mouth every evening.   Yes Historical Provider, MD  sitaGLIPtan (JANUVIA) 50 MG tablet Take 50 mg by mouth daily.     Yes Historical Provider, MD     ROS: As above in the HPI. All other systems are stable or negative.  OBJECTIVE: APPEARANCE:  Patient in no acute distress.The patient appeared well nourished and normally developed. Acyanotic. Waist: VITAL SIGNS:BP 129/73  Pulse 66  Temp(Src) 97.3 F (36.3 C) (Oral)  Wt 218 lb 9.6 oz (99.156 kg)  BMI 35.3 kg/m2 WM obese  SKIN: warm and  Dry without overt rashes, tattoos and scars  HEAD and Neck: without JVD, Head and scalp: normal Eyes:No scleral icterus. Fundi normal, eye movements normal. Ears: Auricle normal, canal normal, Tympanic membranes normal, insufflation normal. Nose: normal Throat: normal Neck & thyroid: normal  CHEST & LUNGS: Chest wall: normal Lungs: Clear  CVS: Reveals the PMI to be normally located. Regular rhythm, First and Second Heart sounds are normal,  absence of murmurs, rubs or gallops. Peripheral vasculature: Radial pulses: normal Dorsal pedis pulses: normal Posterior pulses: normal  ABDOMEN:  Appearance: obese Benign, no organomegaly, no masses, no Abdominal Aortic enlargement. No Guarding , no rebound. No Bruits. Bowel sounds: normal  RECTAL: N/A GU: N/A  EXTREMETIES: nonedematous.  MUSCULOSKELETAL:  Spine: normal Joints: intact  NEUROLOGIC: oriented to time,place and person; nonfocal. Strength is normal Sensory is normal Reflexes are normal Cranial Nerves are normal.  ASSESSMENT: Diabetes - Plan: metFORMIN  (GLUCOPHAGE) 1000 MG tablet, sitaGLIPtin (JANUVIA) 50 MG tablet, glimepiride (AMARYL) 4 MG tablet, CMP14+EGFR, POCT glycosylated hemoglobin (Hb A1C)  Essential hypertension, benign - Plan: lisinopril (PRINIVIL,ZESTRIL) 10 MG tablet, niacin (NIASPAN) 1000 MG CR tablet, simvastatin (ZOCOR) 40 MG tablet, CMP14+EGFR  Obstructive sleep apnea  Other dysphagia - Plan: omeprazole (PRILOSEC) 40 MG capsule  Reflux esophagitis - Plan: omeprazole (PRILOSEC) 40 MG capsule  Stricture and stenosis of esophagus - Plan: omeprazole (PRILOSEC) 40 MG capsule  Acute esophagitis - Plan: omeprazole (PRILOSEC) 40 MG capsule  Arthritis - Plan: meloxicam (MOBIC) 15 MG tablet  HLD (hyperlipidemia) - Plan: NMR, lipoprofile   PLAN:  Orders Placed This Encounter  Procedures  . CMP14+EGFR  . NMR, lipoprofile  . POCT glycosylated hemoglobin (Hb A1C)   Meds ordered this encounter  Medications  . aspirin 81 MG tablet    Sig: Take 81 mg by mouth daily.  [name] omeprazole (PRILOSEC) 40 MG capsule    Sig: Take 1 capsule (40 mg total) by mouth at bedtime.    Dispense:  30 capsule    Refill:  11  . lisinopril (PRINIVIL,ZESTRIL) 10 MG tablet    Sig: Take 1 tablet (10 mg total) by mouth daily.    Dispense:  30 tablet    Refill:  11  . meloxicam (MOBIC) 15 MG tablet    Sig: Take 1 tablet (15 mg total) by mouth daily.    Dispense:  30 tablet    Refill:  11  . metFORMIN (GLUCOPHAGE) 1000 MG tablet    Sig: Take 1 tablet (1,000 mg total) by mouth daily with breakfast.    Dispense:  30 tablet    Refill:  11  . niacin (NIASPAN) 1000 MG CR tablet    Sig: Take 1 tablet (1,000 mg total) by mouth at bedtime.    Dispense:  30 tablet    Refill:  11  . simvastatin (ZOCOR) 40 MG tablet    Sig: Take 1 tablet (40 mg total) by mouth every evening.    Dispense:  30 tablet    Refill:  11  . sitaGLIPtin (JANUVIA) 50 MG tablet    Sig: Take 1 tablet (50 mg total) by mouth daily.    Dispense:  30 tablet    Refill:  11  .  glimepiride (AMARYL) 4 MG tablet    Sig: Take 1 tablet (4 mg total) by mouth daily before breakfast.    Dispense:  30 tablet    Refill:  11        Dr Woodroe Mode Recommendations  For nutrition information, I recommend books:  1).Eat to Live by Dr Monico Hoar. 2).Prevent and Reverse Heart Disease by Dr Suzzette Righter. 3) Dr Katherina Right Book:  Program to Reverse Diabetes  Exercise recommendations are:  If unable to walk, then the patient can exercise in a chair 3 times a day. By flapping arms like a bird gently and raising legs outwards to the front.  If ambulatory, the patient can go for walks for 30 minutes 3 times a week. Then increase the intensity and duration as tolerated.  Goal is to try to attain exercise frequency to 5 times a week.  If applicable: Best to perform resistance exercises (machines or weights) 2 days a week and cardio type exercises 3 days per week.  Return in about 3 months (around 11/28/2013) for Recheck medical problems.  Emberlie Gotcher P. Modesto Charon, M.D.

## 2013-08-29 NOTE — Patient Instructions (Addendum)
      Dr Leilanie Rauda's Recommendations  For nutrition information, I recommend books:  1).Eat to Live by Dr Joel Fuhrman. 2).Prevent and Reverse Heart Disease by Dr Caldwell Esselstyn. 3) Dr Neal Barnard's Book:  Program to Reverse Diabetes  Exercise recommendations are:  If unable to walk, then the patient can exercise in a chair 3 times a day. By flapping arms like a bird gently and raising legs outwards to the front.  If ambulatory, the patient can go for walks for 30 minutes 3 times a week. Then increase the intensity and duration as tolerated.  Goal is to try to attain exercise frequency to 5 times a week.  If applicable: Best to perform resistance exercises (machines or weights) 2 days a week and cardio type exercises 3 days per week.  

## 2013-08-31 LAB — CMP14+EGFR
ALT: 28 IU/L (ref 0–44)
AST: 21 IU/L (ref 0–40)
Albumin/Globulin Ratio: 1.8 (ref 1.1–2.5)
Albumin: 4.3 g/dL (ref 3.6–4.8)
Alkaline Phosphatase: 40 IU/L (ref 39–117)
BUN/Creatinine Ratio: 29 — ABNORMAL HIGH (ref 10–22)
BUN: 22 mg/dL (ref 8–27)
CO2: 22 mmol/L (ref 18–29)
Calcium: 9.3 mg/dL (ref 8.6–10.2)
Chloride: 98 mmol/L (ref 97–108)
Creatinine, Ser: 0.75 mg/dL — ABNORMAL LOW (ref 0.76–1.27)
GFR calc Af Amer: 115 mL/min/{1.73_m2} (ref >59)
GFR calc non Af Amer: 100 mL/min/{1.73_m2} (ref >59)
Globulin, Total: 2.4 g/dL (ref 1.5–4.5)
Glucose: 217 mg/dL — ABNORMAL HIGH (ref 65–99)
Potassium: 4.7 mmol/L (ref 3.5–5.2)
Sodium: 137 mmol/L (ref 134–144)
Total Bilirubin: 0.5 mg/dL (ref 0.0–1.2)
Total Protein: 6.7 g/dL (ref 6.0–8.5)

## 2013-08-31 LAB — NMR, LIPOPROFILE
Cholesterol: 103 mg/dL (ref <200)
HDL Cholesterol by NMR: 35 mg/dL — ABNORMAL LOW (ref >=40)
HDL Particle Number: 27.5 umol/L — ABNORMAL LOW (ref >=30.5)
LDL Particle Number: 905 nmol/L (ref <1000)
LDL Size: 20.1 nm — ABNORMAL LOW (ref >20.5)
LDLC SERPL CALC-MCNC: 32 mg/dL (ref <100)
LP-IR Score: 74 — ABNORMAL HIGH (ref <=45)
Small LDL Particle Number: 726 nmol/L — ABNORMAL HIGH (ref <=527)
Triglycerides by NMR: 180 mg/dL — ABNORMAL HIGH (ref <150)

## 2013-10-31 ENCOUNTER — Other Ambulatory Visit: Payer: Self-pay

## 2013-12-13 ENCOUNTER — Ambulatory Visit (INDEPENDENT_AMBULATORY_CARE_PROVIDER_SITE_OTHER): Payer: BC Managed Care – PPO | Admitting: Family Medicine

## 2013-12-13 ENCOUNTER — Encounter: Payer: Self-pay | Admitting: Family Medicine

## 2013-12-13 VITALS — BP 111/65 | HR 69 | Temp 98.2°F | Ht 66.0 in | Wt 196.6 lb

## 2013-12-13 DIAGNOSIS — E785 Hyperlipidemia, unspecified: Secondary | ICD-10-CM

## 2013-12-13 DIAGNOSIS — G4733 Obstructive sleep apnea (adult) (pediatric): Secondary | ICD-10-CM

## 2013-12-13 DIAGNOSIS — K219 Gastro-esophageal reflux disease without esophagitis: Secondary | ICD-10-CM

## 2013-12-13 DIAGNOSIS — Z23 Encounter for immunization: Secondary | ICD-10-CM

## 2013-12-13 DIAGNOSIS — E119 Type 2 diabetes mellitus without complications: Secondary | ICD-10-CM

## 2013-12-13 DIAGNOSIS — E1169 Type 2 diabetes mellitus with other specified complication: Secondary | ICD-10-CM | POA: Insufficient documentation

## 2013-12-13 DIAGNOSIS — I10 Essential (primary) hypertension: Secondary | ICD-10-CM

## 2013-12-13 DIAGNOSIS — R1319 Other dysphagia: Secondary | ICD-10-CM

## 2013-12-13 LAB — POCT GLYCOSYLATED HEMOGLOBIN (HGB A1C): Hemoglobin A1C: 6.4

## 2013-12-13 LAB — POCT UA - MICROALBUMIN: Microalbumin Ur, POC: 20 mg/L

## 2013-12-13 NOTE — Progress Notes (Signed)
Patient ID: Austin Ortiz, male   DOB: [birth year], 62 y.o.   MRN: [id number] SUBJECTIVE: CC: Chief Complaint  Patient presents with  . Hypertension  . Diabetes  . Gastrophageal Reflux  . Hyperlipidemia    HPI: Patient is here for follow up of Diabetes Mellitus/HLD/HTN/: Symptoms evaluated: Denies Nocturia ,Denies Urinary Frequency , denies Blurred vision ,deniesDizziness,denies.Dysuria,denies paresthesias, denies extremity pain or ulcers.Austin Ortiz chest pain. has had an annual eye exam. do check the feet. Does check CBGs. Average WUJ:WJXB weight and sugars dropped and he had to reduce the amaryl to 2 mg or 1/2 a tab.  Denies episodes of hypoglycemia. Does have an emergency hypoglycemic plan. admits toCompliance with medications. Denies Problems with medications.   Past Medical History  Diagnosis Date  . Diabetes mellitus   . Kidney stones   . Foreign body in subcutaneous tissue     metal off of a machine, epigastric abd. area  . Esophageal stricture   . Obstructive sleep apnea 05/28/2013  . Hypertension   . Hyperlipidemia    Past Surgical History  Procedure Laterality Date  . Neg hx     History   Social History  . Marital Status: Married    Spouse Name: Austin Ortiz    Number of Children: 3  . Years of Education: N/A   Occupational History  . self employed- farmer    Social History Main Topics  . Smoking status: Never Smoker   . Smokeless tobacco: Never Used  . Alcohol Use: No  . Drug Use: No  . Sexual Activity: Not on file   Other Topics Concern  . Not on file   Social History Narrative  . No narrative on file   Family History  Problem Relation Age of Onset  . Heart disease Mother   . Hypertension Mother   . Diabetes type II Paternal Aunt     x2  . Nephrolithiasis Maternal Aunt   . Breast cancer Maternal Aunt   . Heart attack Father     x 2  . Diabetes Maternal Aunt   . Diabetes Cousin     mat. side  . Colon cancer Neg Hx    Current Outpatient  Prescriptions on File Prior to Visit  Medication Sig Dispense Refill  . aspirin 81 MG tablet Take 81 mg by mouth daily.      . Glucosamine-Chondroitin-Vit D3 1500-1200-800 MG-MG-UNIT PACK Take 1 capsule by mouth.      Austin Kitchen lisinopril (PRINIVIL,ZESTRIL) 10 MG tablet Take 1 tablet (10 mg total) by mouth daily.  30 tablet  11  . meloxicam (MOBIC) 15 MG tablet Take 1 tablet (15 mg total) by mouth daily.  30 tablet  11  . metFORMIN (GLUCOPHAGE) 1000 MG tablet Take 1 tablet (1,000 mg total) by mouth daily with breakfast.  30 tablet  11  . Multiple Vitamin (MULTIVITAMIN) tablet Take 1 tablet by mouth daily.      . niacin (NIASPAN) 1000 MG CR tablet Take 1 tablet (1,000 mg total) by mouth at bedtime.  30 tablet  11  . Omega-3 Fatty Acids (FISH OIL) 600 MG CAPS Take 1,200 mg by mouth daily.      Austin Kitchen omeprazole (PRILOSEC) 40 MG capsule Take 1 capsule (40 mg total) by mouth at bedtime.  30 capsule  11  . simvastatin (ZOCOR) 40 MG tablet Take 1 tablet (40 mg total) by mouth every evening.  30 tablet  11  . sitaGLIPtin (JANUVIA) 50 MG tablet Take 1 tablet (50 mg total) by  mouth daily.  30 tablet  11   No current facility-administered medications on file prior to visit.   No Known Allergies Immunization History  Administered Date(s) Administered  . Pneumococcal Conjugate-13 12/13/2013  . Tdap 03/29/2012   Prior to Admission medications   Medication Sig Start Date End Date Taking? Authorizing Provider  aspirin 81 MG tablet Take 81 mg by mouth daily.   Yes Historical Provider, MD  glimepiride (AMARYL) 4 MG tablet Take 2 mg by mouth daily before breakfast. 08/29/13  Yes Ileana Ladd, MD  Glucosamine-Chondroitin-Vit D3 1500-1200-800 MG-MG-UNIT PACK Take 1 capsule by mouth.   Yes Historical Provider, MD  lisinopril (PRINIVIL,ZESTRIL) 10 MG tablet Take 1 tablet (10 mg total) by mouth daily. 08/29/13  Yes Ileana Ladd, MD  meloxicam (MOBIC) 15 MG tablet Take 1 tablet (15 mg total) by mouth daily. 08/29/13  Yes  Ileana Ladd, MD  metFORMIN (GLUCOPHAGE) 1000 MG tablet Take 1 tablet (1,000 mg total) by mouth daily with breakfast. 08/29/13  Yes Ileana Ladd, MD  Multiple Vitamin (MULTIVITAMIN) tablet Take 1 tablet by mouth daily.   Yes Historical Provider, MD  niacin (NIASPAN) 1000 MG CR tablet Take 1 tablet (1,000 mg total) by mouth at bedtime. 08/29/13  Yes Ileana Ladd, MD  Omega-3 Fatty Acids (FISH OIL) 600 MG CAPS Take 1,200 mg by mouth daily.   Yes Historical Provider, MD  omeprazole (PRILOSEC) 40 MG capsule Take 1 capsule (40 mg total) by mouth at bedtime. 08/29/13  Yes Ileana Ladd, MD  simvastatin (ZOCOR) 40 MG tablet Take 1 tablet (40 mg total) by mouth every evening. 08/29/13  Yes Ileana Ladd, MD  sitaGLIPtin (JANUVIA) 50 MG tablet Take 1 tablet (50 mg total) by mouth daily. 08/29/13  Yes Ileana Ladd, MD     ROS: As above in the HPI. All other systems are stable or negative.  OBJECTIVE: APPEARANCE:  Patient in no acute distress.The patient appeared well nourished and normally developed. Acyanotic. Waist: VITAL SIGNS:BP 111/65  Pulse 69  Temp(Src) 98.2 F (36.8 C) (Oral)  Ht 5\' 6"  (1.676 m)  Wt 196 lb 9.6 oz (89.177 kg)  BMI 31.75 kg/m2  WM looks slimmer and looks well.  SKIN: warm and  Dry without overt rashes, tattoos and scars  HEAD and Neck: without JVD, Head and scalp: normal Eyes:No scleral icterus. Fundi normal, eye movements normal. Ears: Auricle normal, canal normal, Tympanic membranes normal, insufflation normal. Nose: normal Throat: normal Neck & thyroid: normal  CHEST & LUNGS: Chest wall: normal Lungs: Clear  CVS: Reveals the PMI to be normally located. Regular rhythm, First and Second Heart sounds are normal,  absence of murmurs, rubs or gallops. Peripheral vasculature: Radial pulses: normal Dorsal pedis pulses: normal Posterior pulses: normal  ABDOMEN:  Appearance: normal Benign, no organomegaly, no masses, no Abdominal Aortic enlargement. No  Guarding , no rebound. No Bruits. Bowel sounds: normal  RECTAL: N/A GU: N/A  EXTREMETIES: nonedematous.  MUSCULOSKELETAL:  Spine: normal Joints: intact  NEUROLOGIC: oriented to time,place and person; nonfocal. Strength is normal Sensory is normal Reflexes are normal Cranial Nerves are normal.   Results for orders placed in visit on 08/29/13  CMP14+EGFR      Result Value Range   Glucose 217 (*) 65 - 99 mg/dL   BUN 22  8 - 27 mg/dL   Creatinine, Ser 1.61 (*) 0.76 - 1.27 mg/dL   GFR calc non Af Amer 100  >59 mL/min/1.73   GFR calc  Af Amer 115  >59 mL/min/1.73   BUN/Creatinine Ratio 29 (*) 10 - 22   Sodium 137  134 - 144 mmol/L   Potassium 4.7  3.5 - 5.2 mmol/L   Chloride 98  97 - 108 mmol/L   CO2 22  18 - 29 mmol/L   Calcium 9.3  8.6 - 10.2 mg/dL   Total Protein 6.7  6.0 - 8.5 g/dL   Albumin 4.3  3.6 - 4.8 g/dL   Globulin, Total 2.4  1.5 - 4.5 g/dL   Albumin/Globulin Ratio 1.8  1.1 - 2.5   Total Bilirubin 0.5  0.0 - 1.2 mg/dL   Alkaline Phosphatase 40  39 - 117 IU/L   AST 21  0 - 40 IU/L   ALT 28  0 - 44 IU/L  NMR, LIPOPROFILE      Result Value Range   LDL Particle Number [id number]  <1000 nmol/L   LDLC SERPL CALC-MCNC 32  <100 mg/dL   HDL Cholesterol by NMR 35 (*) >=40 mg/dL   Triglycerides by NMR 180 (*) <150 mg/dL   Cholesterol 161  <096 mg/dL   HDL Particle Number 04.5 (*) >=30.5 umol/L   Small LDL Particle Number [id number] (*) <=527 nmol/L   LDL Size 20.1 (*) >20.5 nm   LP-IR Score 74 (*) <=45  POCT GLYCOSYLATED HEMOGLOBIN (HGB A1C)      Result Value Range   Hemoglobin A1C 8.2      ASSESSMENT: Diabetes mellitus, type 2 - Plan: POCT glycosylated hemoglobin (Hb A1C), POCT UA - Microalbumin, CMP14+EGFR  Hypertension - Plan: CMP14+EGFR  GERD (gastroesophageal reflux disease)  Other dysphagia  Obstructive sleep apnea  Essential hypertension, benign  Diabetes  Hyperlipidemia - Plan: CMP14+EGFR, NMR, lipoprofile  Need for prophylactic vaccination against  Streptococcus pneumoniae (pneumococcus) - Plan: Pneumococcal conjugate vaccine 13-valent Obesity improving overall doing better on weight reduction and changing to a Eat to Live diet.  PLAN: DM hand out in the AVS.  Instructed on reducing DM meds with his weight loss program. Risk for hypoglycemia discussed.   Orders Placed This Encounter  Procedures  . Pneumococcal conjugate vaccine 13-valent  . CMP14+EGFR  . NMR, lipoprofile  . POCT glycosylated hemoglobin (Hb A1C)  . POCT UA - Microalbumin   Meds ordered this encounter  Medications  . glimepiride (AMARYL) 4 MG tablet    Sig: Take 2 mg by mouth daily before breakfast.   Medications Discontinued During This Encounter  Medication Reason  . glimepiride (AMARYL) 4 MG tablet    Return in about 3 months (around 03/13/2014) for Recheck medical problems.  Adelfo Diebel P. [place] Charon, M.D.

## 2013-12-13 NOTE — Patient Instructions (Addendum)
As the sugar drops with weight loss, then discontinue the amaryl/glimeperide first.   Diabetes and Foot Care Diabetes may cause you to have problems because of poor blood supply (circulation) to your feet and legs. This may cause the skin on your feet to become thinner, break easier, and heal more slowly. Your skin may become dry, and the skin may peel and crack. You may also have nerve damage in your legs and feet causing decreased feeling in them. You may not notice minor injuries to your feet that could lead to infections or more serious problems. Taking care of your feet is one of the most important things you can do for yourself.  HOME CARE INSTRUCTIONS  Wear shoes at all times, even in the house. Do not go barefoot. Bare feet are easily injured.  Check your feet daily for blisters, cuts, and redness. If you cannot see the bottom of your feet, use a mirror or ask someone for help.  Wash your feet with warm water (do not use hot water) and mild soap. Then pat your feet and the areas between your toes until they are completely dry. Do not soak your feet as this can dry your skin.  Apply a moisturizing lotion or petroleum jelly (that does not contain alcohol and is unscented) to the skin on your feet and to dry, brittle toenails. Do not apply lotion between your toes.  Trim your toenails straight across. Do not dig under them or around the cuticle. File the edges of your nails with an emery board or nail file.  Do not cut corns or calluses or try to remove them with medicine.  Wear clean socks or stockings every day. Make sure they are not too tight. Do not wear knee-high stockings since they may decrease blood flow to your legs.  Wear shoes that fit properly and have enough cushioning. To break in new shoes, wear them for just a few hours a day. This prevents you from injuring your feet. Always look in your shoes before you put them on to be sure there are no objects inside.  Do not cross  your legs. This may decrease the blood flow to your feet.  If you find a minor scrape, cut, or break in the skin on your feet, keep it and the skin around it clean and dry. These areas may be cleansed with mild soap and water. Do not cleanse the area with peroxide, alcohol, or iodine.  When you remove an adhesive bandage, be sure not to damage the skin around it.  If you have a wound, look at it several times a day to make sure it is healing.  Do not use heating pads or hot water bottles. They may burn your skin. If you have lost feeling in your feet or legs, you may not know it is happening until it is too late.  Make sure your health care provider performs a complete foot exam at least annually or more often if you have foot problems. Report any cuts, sores, or bruises to your health care provider immediately. SEEK MEDICAL CARE IF:   You have an injury that is not healing.  You have cuts or breaks in the skin.  You have an ingrown nail.  You notice redness on your legs or feet.  You feel burning or tingling in your legs or feet.  You have pain or cramps in your legs and feet.  Your legs or feet are numb.  Your feet  always feel cold. SEEK IMMEDIATE MEDICAL CARE IF:   There is increasing redness, swelling, or pain in or around a wound.  There is a red line that goes up your leg.  Pus is coming from a wound.  You develop a fever or as directed by your health care provider.  You notice a bad smell coming from an ulcer or wound. Document Released: 12/09/2000 Document Revised: 08/14/2013 Document Reviewed: 05/21/2013 [facility] Patient Information 2014 [place], [place].   Continue with the Eat to Live Program.  Congrats on your success!    Pneumococcal Vaccine, Polyvalent suspension for injection What is this medicine? PNEUMOCOCCAL VACCINE, POLYVALENT (NEU mo KOK al vak SEEN, pol ee VEY luhnt) is a vaccine to prevent pneumococcus bacteria infection. These bacteria are a  major cause of ear infections, 'Strep throat' infections, and serious pneumonia, meningitis, or blood infections worldwide. These vaccines help the body to produce antibodies (protective substances) that help your body defend against these bacteria. This vaccine is recommended for infants and young children. This vaccine will not treat an infection. This medicine may be used for other purposes; ask your health care provider or pharmacist if you have questions. COMMON BRAND NAME(S): Prevnar 13 , Prevnar What should I tell my health care provider before I take this medicine? They need to know if you have any of these conditions: -bleeding problems -fever -immune system problems -low platelet count in the blood -seizures -an unusual or allergic reaction to pneumococcal vaccine, diphtheria toxoid, other vaccines, latex, other medicines, foods, dyes, or preservatives -pregnant or trying to get pregnant -breast-feeding How should I use this medicine? This vaccine is for injection into a muscle. It is given by a health care professional. A copy of Vaccine Information Statements will be given before each vaccination. Read this sheet carefully each time. The sheet may change frequently. Talk to your pediatrician regarding the use of this medicine in children. While this drug may be prescribed for children as young as 84 weeks old for selected conditions, precautions do apply. Overdosage: If you think you have taken too much of this medicine contact a poison control center or emergency room at once. NOTE: This medicine is only for you. Do not share this medicine with others. What if I miss a dose? It is important not to miss your dose. Call your doctor or health care professional if you are unable to keep an appointment. What may interact with this medicine? -medicines for cancer chemotherapy -medicines that suppress your immune function -medicines that treat or prevent blood clots like warfarin,  enoxaparin, and dalteparin -steroid medicines like prednisone or cortisone This list may not describe all possible interactions. Give your health care provider a list of all the medicines, herbs, non-prescription drugs, or dietary supplements you use. Also tell them if you smoke, drink alcohol, or use illegal drugs. Some items may interact with your medicine. What should I watch for while using this medicine? Mild fever and pain should go away in 3 days or less. Report any unusual symptoms to your doctor or health care professional. What side effects may I notice from receiving this medicine? Side effects that you should report to your doctor or health care professional as soon as possible: -allergic reactions like skin rash, itching or hives, swelling of the face, lips, or tongue -breathing problems -confused -fever over 102 degrees F -pain, tingling, numbness in the hands or feet -seizures -unusual bleeding or bruising -unusual muscle weakness Side effects that usually do not require  medical attention (report to your doctor or health care professional if they continue or are bothersome): -aches and pains -diarrhea -fever of 102 degrees F or less -headache -irritable -loss of appetite -pain, tender at site where injected -trouble sleeping This list may not describe all possible side effects. Call your doctor for medical advice about side effects. You may report side effects to FDA at 1-800-FDA-1088. Where should I keep my medicine? This does not apply. This vaccine is given in a clinic, pharmacy, doctor's office, or other health care setting and will not be stored at home. NOTE: This sheet is a summary. It may not cover all possible information. If you have questions about this medicine, talk to your doctor, pharmacist, or health care provider.  2014, [employer]. (2009-02-24 10:17:22)

## 2013-12-14 LAB — MICROALBUMIN, URINE: Microalbumin, Urine: 4.4 ug/mL (ref 0.0–17.0)

## 2013-12-15 LAB — CMP14+EGFR
ALT: 26 IU/L (ref 0–44)
AST: 19 IU/L (ref 0–40)
Albumin/Globulin Ratio: 1.5 (ref 1.1–2.5)
Albumin: 4.4 g/dL (ref 3.6–4.8)
Alkaline Phosphatase: 43 IU/L (ref 39–117)
BUN/Creatinine Ratio: 19 (ref 10–22)
BUN: 18 mg/dL (ref 8–27)
CO2: 23 mmol/L (ref 18–29)
Calcium: 9.3 mg/dL (ref 8.6–10.2)
Chloride: 101 mmol/L (ref 97–108)
Creatinine, Ser: 0.93 mg/dL (ref 0.76–1.27)
GFR calc Af Amer: 102 mL/min/{1.73_m2} (ref >59)
GFR calc non Af Amer: 88 mL/min/{1.73_m2} (ref >59)
Globulin, Total: 2.9 g/dL (ref 1.5–4.5)
Glucose: 134 mg/dL — ABNORMAL HIGH (ref 65–99)
Potassium: 4.9 mmol/L (ref 3.5–5.2)
Sodium: 140 mmol/L (ref 134–144)
Total Bilirubin: 0.4 mg/dL (ref 0.0–1.2)
Total Protein: 7.3 g/dL (ref 6.0–8.5)

## 2013-12-15 LAB — NMR, LIPOPROFILE
Cholesterol: 103 mg/dL (ref <200)
HDL Cholesterol by NMR: 43 mg/dL (ref >=40)
HDL Particle Number: 32.5 umol/L (ref >=30.5)
LDL Particle Number: 786 nmol/L (ref <1000)
LDL Size: 20.2 nm — ABNORMAL LOW (ref >20.5)
LDLC SERPL CALC-MCNC: 41 mg/dL (ref <100)
LP-IR Score: 59 — ABNORMAL HIGH (ref <=45)
Small LDL Particle Number: 571 nmol/L — ABNORMAL HIGH (ref <=527)
Triglycerides by NMR: 93 mg/dL (ref <150)

## 2014-03-25 ENCOUNTER — Encounter: Payer: Self-pay | Admitting: Family Medicine

## 2014-03-25 ENCOUNTER — Ambulatory Visit (INDEPENDENT_AMBULATORY_CARE_PROVIDER_SITE_OTHER): Payer: BC Managed Care – PPO | Admitting: Family Medicine

## 2014-03-25 VITALS — BP 144/78 | HR 65 | Temp 98.0°F | Ht 66.0 in | Wt 199.0 lb

## 2014-03-25 DIAGNOSIS — E1165 Type 2 diabetes mellitus with hyperglycemia: Secondary | ICD-10-CM

## 2014-03-25 DIAGNOSIS — E785 Hyperlipidemia, unspecified: Secondary | ICD-10-CM

## 2014-03-25 DIAGNOSIS — G4733 Obstructive sleep apnea (adult) (pediatric): Secondary | ICD-10-CM

## 2014-03-25 DIAGNOSIS — I10 Essential (primary) hypertension: Secondary | ICD-10-CM

## 2014-03-25 DIAGNOSIS — IMO0001 Reserved for inherently not codable concepts without codable children: Secondary | ICD-10-CM

## 2014-03-25 LAB — POCT GLYCOSYLATED HEMOGLOBIN (HGB A1C): Hemoglobin A1C: 6.7

## 2014-03-25 NOTE — Patient Instructions (Signed)
Diabetes and Foot Care Diabetes may cause you to have problems because of poor blood supply (circulation) to your feet and legs. This may cause the skin on your feet to become thinner, break easier, and heal more slowly. Your skin may become dry, and the skin may peel and crack. You may also have nerve damage in your legs and feet causing decreased feeling in them. You may not notice minor injuries to your feet that could lead to infections or more serious problems. Taking care of your feet is one of the most important things you can do for yourself.  HOME CARE INSTRUCTIONS  Wear shoes at all times, even in the house. Do not go barefoot. Bare feet are easily injured.  Check your feet daily for blisters, cuts, and redness. If you cannot see the bottom of your feet, use a mirror or ask someone for help.  Wash your feet with warm water (do not use hot water) and mild soap. Then pat your feet and the areas between your toes until they are completely dry. Do not soak your feet as this can dry your skin.  Apply a moisturizing lotion or petroleum jelly (that does not contain alcohol and is unscented) to the skin on your feet and to dry, brittle toenails. Do not apply lotion between your toes.  Trim your toenails straight across. Do not dig under them or around the cuticle. File the edges of your nails with an emery board or nail file.  Do not cut corns or calluses or try to remove them with medicine.  Wear clean socks or stockings every day. Make sure they are not too tight. Do not wear knee-high stockings since they may decrease blood flow to your legs.  Wear shoes that fit properly and have enough cushioning. To break in new shoes, wear them for just a few hours a day. This prevents you from injuring your feet. Always look in your shoes before you put them on to be sure there are no objects inside.  Do not cross your legs. This may decrease the blood flow to your feet.  If you find a minor scrape,  cut, or break in the skin on your feet, keep it and the skin around it clean and dry. These areas may be cleansed with mild soap and water. Do not cleanse the area with peroxide, alcohol, or iodine.  When you remove an adhesive bandage, be sure not to damage the skin around it.  If you have a wound, look at it several times a day to make sure it is healing.  Do not use heating pads or hot water bottles. They may burn your skin. If you have lost feeling in your feet or legs, you may not know it is happening until it is too late.  Make sure your health care provider performs a complete foot exam at least annually or more often if you have foot problems. Report any cuts, sores, or bruises to your health care provider immediately. SEEK MEDICAL CARE IF:   You have an injury that is not healing.  You have cuts or breaks in the skin.  You have an ingrown nail.  You notice redness on your legs or feet.  You feel burning or tingling in your legs or feet.  You have pain or cramps in your legs and feet.  Your legs or feet are numb.  Your feet always feel cold. SEEK IMMEDIATE MEDICAL CARE IF:   There is increasing redness,   swelling, or pain in or around a wound.  There is a red line that goes up your leg.  Pus is coming from a wound.  You develop a fever or as directed by your health care provider.  You notice a bad smell coming from an ulcer or wound. Document Released: 12/09/2000 Document Revised: 08/14/2013 Document Reviewed: 05/21/2013 [employer] Patient Information 2014 [employer].  

## 2014-03-25 NOTE — Progress Notes (Signed)
Patient ID: Austin Ortiz, male   DOB: [birth year], 63 y.o.   MRN: [id number] SUBJECTIVE: CC: Chief Complaint  Patient presents with  . Follow-up    3 month follow up no complaints     HPI: Patient is here for follow up of Diabetes Mellitus/HLD/HTN: Symptoms evaluated: Denies Nocturia ,Denies Urinary Frequency , denies Blurred vision ,deniesDizziness,denies.Dysuria,denies paresthesias, denies extremity pain or ulcers.[name] chest pain. has had an annual eye exam. do check the feet. Does check CBGs. Average CBG:120-150 Denies episodes of hypoglycemia. Does have an emergency hypoglycemic plan. admits toCompliance with medications. Denies Problems with medications.  Breakfast: apple and orange Lunch salad and nuts Supper: whatever: meat and vegetables. Last fish sweet potato fries and slaw. Night time popcorn or nuts.  Past Medical History  Diagnosis Date  . Diabetes mellitus   . Kidney stones   . Foreign body in subcutaneous tissue     metal off of a machine, epigastric abd. area  . Esophageal stricture   . Obstructive sleep apnea 05/28/2013  . Hypertension   . Hyperlipidemia    Past Surgical History  Procedure Laterality Date  . Neg hx     History   Social History  . Marital Status: Married    Spouse Name: [name]    Number of Children: 3  . Years of Education: N/A   Occupational History  . self employed- farmer    Social History Main Topics  . Smoking status: Never Smoker   . Smokeless tobacco: Never Used  . Alcohol Use: No  . Drug Use: No  . Sexual Activity: Not on file   Other Topics Concern  . Not on file   Social History Narrative  . No narrative on file   Family History  Problem Relation Age of Onset  . Heart disease Mother   . Hypertension Mother   . Diabetes type II Paternal Aunt     x2  . Nephrolithiasis Maternal Aunt   . Breast cancer Maternal Aunt   . Heart attack Father     x 2  . Diabetes Maternal Aunt   . Diabetes Cousin     mat.  side  . Colon cancer Neg Hx    Current Outpatient Prescriptions on File Prior to Visit  Medication Sig Dispense Refill  . aspirin 81 MG tablet Take 81 mg by mouth daily.      [name] glimepiride (AMARYL) 4 MG tablet Take 2 mg by mouth daily before breakfast.      . Glucosamine-Chondroitin-Vit D3 1500-1200-800 MG-MG-UNIT PACK Take 1 capsule by mouth.      [name] lisinopril (PRINIVIL,ZESTRIL) 10 MG tablet Take 1 tablet (10 mg total) by mouth daily.  30 tablet  11  . meloxicam (MOBIC) 15 MG tablet Take 1 tablet (15 mg total) by mouth daily.  30 tablet  11  . metFORMIN (GLUCOPHAGE) 1000 MG tablet Take 1 tablet (1,000 mg total) by mouth daily with breakfast.  30 tablet  11  . Multiple Vitamin (MULTIVITAMIN) tablet Take 1 tablet by mouth daily.      . niacin (NIASPAN) 1000 MG CR tablet Take 1 tablet (1,000 mg total) by mouth at bedtime.  30 tablet  11  . Omega-3 Fatty Acids (FISH OIL) 600 MG CAPS Take 1,200 mg by mouth daily.      [name] omeprazole (PRILOSEC) 40 MG capsule Take 1 capsule (40 mg total) by mouth at bedtime.  30 capsule  11  . simvastatin (ZOCOR) 40 MG tablet Take 1 tablet (40  mg total) by mouth every evening.  30 tablet  11  . sitaGLIPtin (JANUVIA) 50 MG tablet Take 1 tablet (50 mg total) by mouth daily.  30 tablet  11   No current facility-administered medications on file prior to visit.   No Known Allergies Immunization History  Administered Date(s) Administered  . Pneumococcal Conjugate-13 12/13/2013  . Tdap 03/29/2012   Prior to Admission medications   Medication Sig Start Date End Date Taking? Authorizing Provider  aspirin 81 MG tablet Take 81 mg by mouth daily.   Yes Historical Provider, MD  glimepiride (AMARYL) 4 MG tablet Take 2 mg by mouth daily before breakfast. 08/29/13  Yes Vernie Shanks, MD  Glucosamine-Chondroitin-Vit D3 1500-1200-800 MG-MG-UNIT PACK Take 1 capsule by mouth.   Yes Historical Provider, MD  lisinopril (PRINIVIL,ZESTRIL) 10 MG tablet Take 1 tablet (10 mg total) by  mouth daily. 08/29/13  Yes Vernie Shanks, MD  meloxicam (MOBIC) 15 MG tablet Take 1 tablet (15 mg total) by mouth daily. 08/29/13  Yes Vernie Shanks, MD  metFORMIN (GLUCOPHAGE) 1000 MG tablet Take 1 tablet (1,000 mg total) by mouth daily with breakfast. 08/29/13  Yes Vernie Shanks, MD  Multiple Vitamin (MULTIVITAMIN) tablet Take 1 tablet by mouth daily.   Yes Historical Provider, MD  niacin (NIASPAN) 1000 MG CR tablet Take 1 tablet (1,000 mg total) by mouth at bedtime. 08/29/13  Yes Vernie Shanks, MD  Omega-3 Fatty Acids (FISH OIL) 600 MG CAPS Take 1,200 mg by mouth daily.   Yes Historical Provider, MD  omeprazole (PRILOSEC) 40 MG capsule Take 1 capsule (40 mg total) by mouth at bedtime. 08/29/13  Yes Vernie Shanks, MD  simvastatin (ZOCOR) 40 MG tablet Take 1 tablet (40 mg total) by mouth every evening. 08/29/13  Yes Vernie Shanks, MD  sitaGLIPtin (JANUVIA) 50 MG tablet Take 1 tablet (50 mg total) by mouth daily. 08/29/13  Yes Vernie Shanks, MD     ROS: As above in the HPI. All other systems are stable or negative.  OBJECTIVE: APPEARANCE:  Patient in no acute distress.The patient appeared well nourished and normally developed. Acyanotic. Waist: VITAL SIGNS:BP 144/78  Pulse 65  Temp(Src) 98 F (36.7 C) (Oral)  Ht _0  (1.676 m)  Wt 199 lb (90.266 kg)  BMI 32.13 kg/m2 WM Obese BP:140/80  SKIN: warm and  Dry without overt rashes, tattoos and scars  HEAD and Neck: without JVD, Head and scalp: normal Eyes:No scleral icterus. Fundi normal, eye movements normal. Ears: Auricle normal, canal normal, Tympanic membranes normal, insufflation normal. Nose: normal Throat: normal Neck & thyroid: normal  CHEST & LUNGS: Chest wall: normal Lungs: Clear  CVS: Reveals the PMI to be normally located. Regular rhythm, First and Second Heart sounds are normal,  absence of murmurs, rubs or gallops. Peripheral vasculature: Radial pulses: normal Dorsal pedis pulses: normal Posterior pulses:  normal  ABDOMEN:  Appearance: normal Benign, no organomegaly, no masses, no Abdominal Aortic enlargement. No Guarding , no rebound. No Bruits. Bowel sounds: normal  RECTAL: N/A GU: N/A  EXTREMETIES: nonedematous.  MUSCULOSKELETAL:  Spine: normal Joints: intact  NEUROLOGIC: oriented to time,place and person; nonfocal. Strength is normal Sensory is normal Reflexes are normal Cranial Nerves are normal.  Results for orders placed in visit on 03/25/14  POCT GLYCOSYLATED HEMOGLOBIN (HGB A1C)      Result Value Ref Range   Hemoglobin A1C 6.7%      ASSESSMENT:  Obstructive sleep apnea  Hyperlipidemia - Plan: Lipid  panel  Essential hypertension, benign - Plan: CMP14+EGFR  Diabetes mellitus type 2, uncontrolled, without complications - Plan: POCT glycosylated hemoglobin (Hb A1C)  PLAN: DM foot care in the AVS. Healthy lifestyle.    Orders Placed This Encounter  Procedures  . CMP14+EGFR  . Lipid panel  . POCT glycosylated hemoglobin (Hb A1C)   No orders of the defined types were placed in this encounter.   There are no discontinued medications. Return in about 3 months (around 06/24/2014) for Recheck medical problems.  Cyprian Gongaware P. Jacelyn Grip, M.D.

## 2014-03-26 ENCOUNTER — Telehealth: Payer: Self-pay | Admitting: Family Medicine

## 2014-03-26 LAB — LIPID PANEL
Chol/HDL Ratio: 2.9 ratio units (ref 0.0–5.0)
Cholesterol, Total: 107 mg/dL (ref 100–199)
HDL: 37 mg/dL — ABNORMAL LOW (ref >39)
LDL Calculated: 41 mg/dL (ref 0–99)
Triglycerides: 144 mg/dL (ref 0–149)
VLDL Cholesterol Cal: 29 mg/dL (ref 5–40)

## 2014-03-26 LAB — CMP14+EGFR
ALT: 22 IU/L (ref 0–44)
AST: 18 IU/L (ref 0–40)
Albumin/Globulin Ratio: 1.6 (ref 1.1–2.5)
Albumin: 4.6 g/dL (ref 3.6–4.8)
Alkaline Phosphatase: 48 IU/L (ref 39–117)
BUN/Creatinine Ratio: 20 (ref 10–22)
BUN: 15 mg/dL (ref 8–27)
CO2: 23 mmol/L (ref 18–29)
Calcium: 9.3 mg/dL (ref 8.6–10.2)
Chloride: 101 mmol/L (ref 97–108)
Creatinine, Ser: 0.76 mg/dL (ref 0.76–1.27)
GFR calc Af Amer: 114 mL/min/{1.73_m2} (ref >59)
GFR calc non Af Amer: 98 mL/min/{1.73_m2} (ref >59)
Globulin, Total: 2.8 g/dL (ref 1.5–4.5)
Glucose: 161 mg/dL — ABNORMAL HIGH (ref 65–99)
Potassium: 4.6 mmol/L (ref 3.5–5.2)
Sodium: 141 mmol/L (ref 134–144)
Total Bilirubin: 0.2 mg/dL (ref 0.0–1.2)
Total Protein: 7.4 g/dL (ref 6.0–8.5)

## 2014-03-26 NOTE — Telephone Encounter (Signed)
Message copied by Waverly Ferrari on Wed Mar 26, 2014  3:44 PM ------      Message from: Vernie Shanks      Created: Wed Mar 26, 2014  3:06 PM       Call Patient      Lab result close to goal.      No change in Medications for now.      No Change in recommendations.He needs to change the diet and change the meals ,as we had discussed at the last visit.and exercise and weight loss would help bring to goal better without more medications.      No change in plans for follow up. ------

## 2014-06-06 ENCOUNTER — Telehealth: Payer: Self-pay | Admitting: Family Medicine

## 2014-06-06 NOTE — Telephone Encounter (Signed)
It is oka wth me but check with Suanne Marker

## 2014-06-06 NOTE — Telephone Encounter (Signed)
Patient needs a dot and a diabetic check at the same time is this ok to do

## 2014-06-06 NOTE — Telephone Encounter (Signed)
Spoke with [place] and she said that was fine

## 2014-06-19 ENCOUNTER — Encounter: Payer: Self-pay | Admitting: Nurse Practitioner

## 2014-06-19 ENCOUNTER — Encounter (INDEPENDENT_AMBULATORY_CARE_PROVIDER_SITE_OTHER): Payer: BC Managed Care – PPO | Admitting: Nurse Practitioner

## 2014-06-19 VITALS — BP 128/72 | HR 71 | Temp 98.2°F | Ht 66.0 in | Wt 202.0 lb

## 2014-06-19 DIAGNOSIS — E1165 Type 2 diabetes mellitus with hyperglycemia: Principal | ICD-10-CM

## 2014-06-19 DIAGNOSIS — Z0000 Encounter for general adult medical examination without abnormal findings: Secondary | ICD-10-CM

## 2014-06-19 DIAGNOSIS — IMO0001 Reserved for inherently not codable concepts without codable children: Secondary | ICD-10-CM

## 2014-06-19 DIAGNOSIS — I10 Essential (primary) hypertension: Secondary | ICD-10-CM

## 2014-06-19 DIAGNOSIS — E785 Hyperlipidemia, unspecified: Secondary | ICD-10-CM

## 2014-06-19 DIAGNOSIS — G4733 Obstructive sleep apnea (adult) (pediatric): Secondary | ICD-10-CM

## 2014-06-19 LAB — POCT URINALYSIS DIPSTICK
BILIRUBIN UA: NEGATIVE
Blood, UA: NEGATIVE
Glucose, UA: NEGATIVE
Ketones, UA: NEGATIVE
LEUKOCYTES UA: NEGATIVE
NITRITE UA: NEGATIVE
Protein, UA: NEGATIVE
Spec Grav, UA: 1.01
Urobilinogen, UA: NEGATIVE
pH, UA: 6

## 2014-06-19 LAB — POCT GLYCOSYLATED HEMOGLOBIN (HGB A1C): HEMOGLOBIN A1C: 7.1

## 2014-06-19 NOTE — Progress Notes (Signed)
Patient did not have proper information in order to do DOT physical today

## 2014-06-19 NOTE — Addendum Note (Signed)
Addended by: Chevis Pretty on: 06/19/2014 02:34 PM   Modules accepted: Orders

## 2014-06-20 LAB — CMP14+EGFR
A/G RATIO: 1.6 (ref 1.1–2.5)
ALT: 26 IU/L (ref 0–44)
AST: 19 IU/L (ref 0–40)
Albumin: 4.4 g/dL (ref 3.6–4.8)
Alkaline Phosphatase: 40 IU/L (ref 39–117)
BUN/Creatinine Ratio: 20 (ref 10–22)
BUN: 16 mg/dL (ref 8–27)
CALCIUM: 9.4 mg/dL (ref 8.6–10.2)
CO2: 24 mmol/L (ref 18–29)
CREATININE: 0.79 mg/dL (ref 0.76–1.27)
Chloride: 101 mmol/L (ref 97–108)
GFR calc Af Amer: 112 mL/min/{1.73_m2} (ref >59)
GFR, EST NON AFRICAN AMERICAN: 97 mL/min/{1.73_m2} (ref >59)
GLUCOSE: 118 mg/dL — AB (ref 65–99)
Globulin, Total: 2.7 g/dL (ref 1.5–4.5)
Potassium: 4.4 mmol/L (ref 3.5–5.2)
Sodium: 139 mmol/L (ref 134–144)
TOTAL PROTEIN: 7.1 g/dL (ref 6.0–8.5)
Total Bilirubin: 0.4 mg/dL (ref 0.0–1.2)

## 2014-06-20 LAB — LIPID PANEL
Chol/HDL Ratio: 2.6 ratio units (ref 0.0–5.0)
Cholesterol, Total: 100 mg/dL (ref 100–199)
HDL: 38 mg/dL — AB (ref >39)
LDL Calculated: 31 mg/dL (ref 0–99)
TRIGLYCERIDES: 153 mg/dL — AB (ref 0–149)
VLDL CHOLESTEROL CAL: 31 mg/dL (ref 5–40)

## 2014-07-07 ENCOUNTER — Telehealth: Payer: Self-pay | Admitting: Nurse Practitioner

## 2014-07-07 DIAGNOSIS — G4733 Obstructive sleep apnea (adult) (pediatric): Secondary | ICD-10-CM

## 2014-07-07 NOTE — Telephone Encounter (Signed)
Ordered sleep study- does not have to do through pulmonology if doesn't want to

## 2014-07-08 NOTE — Telephone Encounter (Signed)
Discussed with patient. He has a CPAP but doesn't wear it. He will need a titration study. Last one was done at [facility] and he would like to go back there.  Test ordered.

## 2014-07-18 ENCOUNTER — Encounter: Payer: Self-pay | Admitting: Pulmonary Disease

## 2014-07-18 ENCOUNTER — Ambulatory Visit (INDEPENDENT_AMBULATORY_CARE_PROVIDER_SITE_OTHER): Payer: BC Managed Care – PPO | Admitting: Pulmonary Disease

## 2014-07-18 VITALS — BP 132/78 | HR 77 | Temp 97.0°F | Ht 65.5 in | Wt 205.4 lb

## 2014-07-18 DIAGNOSIS — G4733 Obstructive sleep apnea (adult) (pediatric): Secondary | ICD-10-CM

## 2014-07-18 NOTE — Progress Notes (Signed)
Chief Complaint  Patient presents with  . SLEEP CONSULT    DOT certification. Pt referred by Dr Hassell Done. Epworth Score: 2    History of Present Illness: Austin Ortiz is a 63 y.o. male for evaluation of sleep problems.  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  He was told he needed evaluation for sleep apnea prior to having this approved.  He was diagnosed in May 2014 with sleep apnea and started on CPAP.  He had lost about 20 lbs, and his sleep improved.  As a result he stopped using CPAP.  He no longer snores.  He denies waking up with with a choking sensation.  His wife does not see him having breathing troubles at night any more.  He goes to sleep at 11 pm.  He falls asleep quickly.  He wakes up one time to use the bathroom.  He gets out of bed at 8 am.  He feels rested in the morning.  He denies morning headache.  He does not use anything to help him fall sleep or stay awake.  He denies sleep walking, sleep talking, bruxism, or nightmares.  There is no history of restless legs.  He denies sleep hallucinations, sleep paralysis, or cataplexy.  The Epworth score is 2 out of 24.  Tests: PSG 05/22/13 >>  AHI 26  Austin Ortiz  has a past medical history of Diabetes mellitus; Kidney stones; Foreign body in subcutaneous tissue; Esophageal stricture; Obstructive sleep apnea (05/28/2013); Hypertension; and Hyperlipidemia.  Austin Ortiz  has past surgical history that includes neg hx.  Prior to Admission medications   Medication Sig Start Date End Date Taking? Authorizing Provider  glimepiride (AMARYL) 4 MG tablet Take 2 mg by mouth daily before breakfast. 08/29/13  Yes Vernie Shanks, MD  Glucosamine-Chondroitin-Vit D3 1500-1200-800 MG-MG-UNIT PACK Take 2 capsules by mouth.    Yes Historical Provider, MD  lisinopril (PRINIVIL,ZESTRIL) 10 MG tablet Take 1 tablet (10 mg total) by mouth daily. 08/29/13  Yes Vernie Shanks, MD  meloxicam (MOBIC) 15 MG tablet Take  1 tablet (15 mg total) by mouth daily. 08/29/13  Yes Vernie Shanks, MD  metFORMIN (GLUCOPHAGE) 1000 MG tablet Take 1 tablet (1,000 mg total) by mouth daily with breakfast. 08/29/13  Yes Vernie Shanks, MD  Multiple Vitamin (MULTIVITAMIN) tablet Take 1 tablet by mouth daily.   Yes Historical Provider, MD  niacin (NIASPAN) 1000 MG CR tablet Take 1 tablet (1,000 mg total) by mouth at bedtime. 08/29/13  Yes Vernie Shanks, MD  Omega-3 Fatty Acids (FISH OIL) 600 MG CAPS Take 3,000 mg by mouth daily.    Yes Historical Provider, MD  omeprazole (PRILOSEC) 40 MG capsule Take 1 capsule (40 mg total) by mouth at bedtime. 08/29/13  Yes Vernie Shanks, MD  simvastatin (ZOCOR) 40 MG tablet Take 1 tablet (40 mg total) by mouth every evening. 08/29/13  Yes Vernie Shanks, MD  sitaGLIPtin (JANUVIA) 50 MG tablet Take 1 tablet (50 mg total) by mouth daily. 08/29/13  Yes Vernie Shanks, MD  aspirin 81 MG tablet Take 81 mg by mouth daily.    Historical Provider, MD    No Known Allergies  His family history includes Breast cancer in his maternal aunt; Diabetes in his cousin and maternal aunt; Diabetes type II in his paternal aunt; Heart attack in his father; Heart disease in his mother; Hypertension in his mother; Nephrolithiasis in his maternal aunt. There is no history of  Colon cancer.  He  reports that he has never smoked. He has never used smokeless tobacco. He reports that he does not drink alcohol or use illicit drugs.  Review of Systems  Constitutional: Negative for fever and unexpected weight change.  HENT: Negative for congestion, dental problem, ear pain, nosebleeds, postnasal drip, rhinorrhea, sinus pressure, sneezing, sore throat and trouble swallowing.   Eyes: Negative for redness and itching.  Respiratory: Negative for cough, chest tightness, shortness of breath and wheezing.   Cardiovascular: Negative for palpitations and leg swelling.  Gastrointestinal: Negative for nausea and vomiting.  Genitourinary:  Negative for dysuria.  Musculoskeletal: Negative for joint swelling.  Skin: Negative for rash.  Neurological: Negative for headaches.  Hematological: Does not bruise/bleed easily.  Psychiatric/Behavioral: Negative for dysphoric mood. The patient is not nervous/anxious.    Physical Exam:  General - No distress ENT - No sinus tenderness, no oral exudate, no LAN, no thyromegaly, TM clear, pupils equal/reactive, MP 2 Cardiac - s1s2 regular, no murmur, pulses symmetric Chest - No wheeze/rales/dullness, good air entry, normal respiratory excursion Back - No focal tenderness Abd - Soft, non-tender, no organomegaly, + bowel sounds Ext - No edema Neuro - Normal strength, cranial nerves intact Skin - No rashes Psych - Normal mood, and behavior  Assessment/plan:  Chesley Mires, M.D. Pager [phone]

## 2014-07-18 NOTE — Patient Instructions (Signed)
Will arrange for sleep study Will call to arrange for follow up after sleep study reviewed 

## 2014-07-18 NOTE — Progress Notes (Deleted)
   Subjective:    Patient ID: Austin Ortiz, male    DOB: 09/21/1951, 63 y.o.   MRN: [id number]  HPI    Review of Systems  Constitutional: Negative for fever and unexpected weight change.  HENT: Negative for congestion, dental problem, ear pain, nosebleeds, postnasal drip, rhinorrhea, sinus pressure, sneezing, sore throat and trouble swallowing.   Eyes: Negative for redness and itching.  Respiratory: Negative for cough, chest tightness, shortness of breath and wheezing.   Cardiovascular: Negative for palpitations and leg swelling.  Gastrointestinal: Negative for nausea and vomiting.  Genitourinary: Negative for dysuria.  Musculoskeletal: Negative for joint swelling.  Skin: Negative for rash.  Neurological: Negative for headaches.  Hematological: Does not bruise/bleed easily.  Psychiatric/Behavioral: Negative for dysphoric mood. The patient is not nervous/anxious.        Objective:   Physical Exam        Assessment & Plan:

## 2014-07-18 NOTE — Assessment & Plan Note (Signed)
He has prior diagnosis of sleep apnea from May 2015.  Since then he has lost significant amount of weight.  With weight loss his sleep is better, and he is no longer snoring.  As a result he no longer feels like he needs to use CPAP.  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  This needs to be completed before August 2015.  To further assess will arrange for in lab sleep study.

## 2014-08-04 ENCOUNTER — Ambulatory Visit (HOSPITAL_BASED_OUTPATIENT_CLINIC_OR_DEPARTMENT_OTHER): Payer: BC Managed Care – PPO | Attending: Pulmonary Disease

## 2014-08-04 VITALS — Ht 66.0 in | Wt 195.0 lb

## 2014-08-04 DIAGNOSIS — G4730 Sleep apnea, unspecified: Secondary | ICD-10-CM | POA: Diagnosis present

## 2014-08-04 DIAGNOSIS — G4733 Obstructive sleep apnea (adult) (pediatric): Secondary | ICD-10-CM

## 2014-08-06 ENCOUNTER — Telehealth: Payer: Self-pay | Admitting: Pulmonary Disease

## 2014-08-06 NOTE — Telephone Encounter (Signed)
Noted.  Will call him as soon as reviewed.

## 2014-08-06 NOTE — Telephone Encounter (Signed)
I called spoke with pt. He had sleep study done 08/04/14. He is requesting results ASAP. Please advise Dr. Halford Chessman, thank you.

## 2014-08-11 ENCOUNTER — Telehealth: Payer: Self-pay | Admitting: Pulmonary Disease

## 2014-08-11 NOTE — Telephone Encounter (Signed)
Called and spoke to pt's EC, [name]. Informed [name] that [facility] has the results and will review them soon. Pt aware someone will be contacting them with the results. [name] verbalized understanding and stated she would relay the message to Mr. Quin. Nothing further needed.

## 2014-08-12 ENCOUNTER — Telehealth: Payer: Self-pay | Admitting: Pulmonary Disease

## 2014-08-12 DIAGNOSIS — G4733 Obstructive sleep apnea (adult) (pediatric): Secondary | ICD-10-CM

## 2014-08-12 NOTE — Telephone Encounter (Signed)
PSG 08/04/14 >> AHI 83.6, SaO2 low 79% both OSA and CSA.  Will have my nurse inform pt that sleep study shows severe sleep apnea, and in fact is worse not compared to study in 2014.  He needs to continue to use CPAP therapy while asleep.    Please send order to arrange for auto CPAP with pressure range of 5 to 15 cm H2O with heated humidity and mask of choice.  Please arrange for ROV two months after CPAP set up.  If pt not agreeable to this plan, then arrange for ROV with either me or [name] Parrett to discuss sleep study results in more detail first.

## 2014-08-12 NOTE — Sleep Study (Signed)
[place]  NAME: Austin Ortiz DATE OF BIRTH:  1951/05/27 MEDICAL RECORD NUMBER [id number]  LOCATION: [facility]  PHYSICIAN: Chesley Mires, M.D. DATE OF STUDY: 08/04/2014  SLEEP STUDY TYPE: Polysomnogram               REFERRING PHYSICIAN: Chesley Mires, MD  INDICATION FOR STUDY:  Austin Ortiz is a 63 y.o. male with prior history of sleep apnea.  Since his original diagnosis in May 2014 he has lost weight.  He returns to the sleep lab to further assess whether he still has sleep apnea.  EPWORTH SLEEPINESS SCORE: 16. HEIGHT: 5\' 6"  (167.6 cm)  WEIGHT: 195 lb (88.451 kg)    Body mass index is 31.49 kg/(m^2).  NECK SIZE: 16 in.  MEDICATIONS:  Current Outpatient Prescriptions on File Prior to Visit  Medication Sig Dispense Refill  . aspirin 81 MG tablet Take 81 mg by mouth daily.      [name] glimepiride (AMARYL) 4 MG tablet Take 2 mg by mouth daily before breakfast.      . Glucosamine-Chondroitin-Vit D3 1500-1200-800 MG-MG-UNIT PACK Take 2 capsules by mouth.       [name] lisinopril (PRINIVIL,ZESTRIL) 10 MG tablet Take 1 tablet (10 mg total) by mouth daily.  30 tablet  11  . meloxicam (MOBIC) 15 MG tablet Take 1 tablet (15 mg total) by mouth daily.  30 tablet  11  . metFORMIN (GLUCOPHAGE) 1000 MG tablet Take 1 tablet (1,000 mg total) by mouth daily with breakfast.  30 tablet  11  . Multiple Vitamin (MULTIVITAMIN) tablet Take 1 tablet by mouth daily.      . niacin (NIASPAN) 1000 MG CR tablet Take 1 tablet (1,000 mg total) by mouth at bedtime.  30 tablet  11  . Omega-3 Fatty Acids (FISH OIL) 600 MG CAPS Take 3,000 mg by mouth daily.       [name] omeprazole (PRILOSEC) 40 MG capsule Take 1 capsule (40 mg total) by mouth at bedtime.  30 capsule  11  . simvastatin (ZOCOR) 40 MG tablet Take 1 tablet (40 mg total) by mouth every evening.  30 tablet  11  . sitaGLIPtin (JANUVIA) 50 MG tablet Take 1 tablet (50 mg total) by mouth daily.  30 tablet  11   No current  facility-administered medications on file prior to visit.    SLEEP ARCHITECTURE:  Total recording time: 368.5 minutes.  Total sleep time was: 343 minutes.  Sleep efficiency: 93.1%.  Sleep latency: 2 minutes.  REM latency: 174.5 minutes.  Stage N1: 9.6%.  Stage N2: 79.7%.  Stage N3: 0%.  Stage R:  10.6%.  Supine sleep: 250 minutes.  Non-supine sleep: 93 minutes.  CARDIAC DATA:  Average heart rate: 68 beats per minute. Rhythm strip: normal sinus rhythm.  RESPIRATORY DATA: Average respiratory rate: 16. Snoring: loud. Average AHI: 83.6.   Apnea index: 30.8.  Hypopnea index: 52.8. Obstructive apnea index: 11.7.  Central apnea index: 12.1.  Mixed apnea index: 7. REM AHI: 88.8.  NREM AHI: 83. Supine AHI: 88.8. Non-supine AHI: 47.4.  MOVEMENT/PARASOMNIA:  Periodic limb movement: 0.  Period limb movements with arousals: 0. Restroom trips: none.  OXYGEN DATA:  Baseline oxygenation: 92%. Lowest SaO2: 79%. Time spent below SaO2 90%: 21.4 minutes. Supplemental oxygen used: none.  IMPRESSION/ RECOMMENDATION:   This study shows severe complex sleep apnea with an overall AHI of 83.6, and SaO2 low of 79%.   Additional therapies include weight loss, CPAP, oral appliance, or surgical  evaluation.  Chesley Mires, M.D. Diplomate, Tax adviser of Sleep Medicine  ELECTRONICALLY SIGNED ON:  08/12/2014, 4:30 PM [place] PH: [phone]   FX: [phone] [place]

## 2014-08-13 NOTE — Telephone Encounter (Signed)
Please have his DME send download from his CPAP for one weeks use.  Can use this information to determine if he is compliant.  Please make sure this gets to me before 08/23/14.

## 2014-08-13 NOTE — Telephone Encounter (Signed)
Pt states that he needs a letter for DOT stating that he is compliant Advised the patient that normally we would need to have a compliance report of atleast 30 days to prove that his sleep apnea is being treated and is controlled. Pt states that this will not work as he needs this letter before 08/23/14. Explained to the patient that I would speak with Dr Halford Chessman regarding this to see if there is something that can be done.   Please advise Dr Halford Chessman. Thanks.   *Orders placed for CPAP pressure change. Patient still needs OV x 2 months with [facility] to follow up CPAP. Recall entered.

## 2014-08-14 NOTE — Telephone Encounter (Signed)
Pt's wife returned call. I informed her of recs per SN. Mrs. Health stated the pt will start wearing his CPAP ASAP and once he wears the machine for one week they will take machine to DME to get download. Mrs. Sandria Manly stated she will call back once the download is obtained, to have letter generated by the 29th. Order placed for download.   Will forward to Ashytn to follow up on.

## 2014-08-14 NOTE — Telephone Encounter (Signed)
Mariann Laster at [facility] hasn't received order yet, due to order being placed late yesterday evening and the DME company that the patient used was not in the order. [facility] didn't know where to send it until this morning.  Order faxed this am. Pt is aware.  Called and spoke with Mariann Laster at [facility]. She stated that [employer] just paid for a s9 escape on 06/18/13 (which does not have auto capability). [employer] will not pay for another cpap machine b/c the one he has is only a year old.  Mariann Laster can provide the patient a loaner CPAP with auto capability x 2 wks and determine pressure, but is unable to provide an auto machine for him to stay on the auto mode setting.   Need to know if Dr. Halford Chessman is ok with this approach with auto x 2 wks to determine pressure, b/c patient will not be eligible for a new s10 with auto capability. Please advise. Rhonda J Cobb

## 2014-08-14 NOTE — Telephone Encounter (Signed)
ATC cell x 2 NA ATC home x 1 Unable to leave vmail on home # ----- Spoke with [facility] they have not received this order.  Will send to [facility] to follow up on as Mariann Laster states that there is an insurance issue. Pt needs CPAP set up and compliance report ASAP as DOT compliance letter is needed by 08/23/14. thanks

## 2014-08-14 NOTE — Telephone Encounter (Signed)
Per Ashtyn- go ahead and place order for temporary CPAP for 1 week with download. Order placed. Rhonda with [facility] aware.   Dr. Halford Chessman please advise what to do after pt's 1 week temporary use of CPAP is up.

## 2014-08-18 NOTE — Telephone Encounter (Signed)
He can continue with fixed CPAP setting after initial download.  Main issue is to assess proper pressure setting with set up, and ensure compliance with therapy.

## 2014-08-20 NOTE — Telephone Encounter (Signed)
Spoke with Solvay @ [facility] Pt is supposed to be turning in AUTO CPAP this week--download will be sent to our office for review by Dr Halford Chessman.  Aware that after AUTO use, he is to return to his normal machine until pressure change can be given.  Nothing further needed.

## 2014-08-21 ENCOUNTER — Telehealth: Payer: Self-pay | Admitting: Pulmonary Disease

## 2014-08-21 NOTE — Telephone Encounter (Signed)
VS please advise on the download results from [employer]  Have you received these yet?  Thanks  No Known Allergies Current Outpatient Prescriptions on File Prior to Visit  Medication Sig Dispense Refill  . aspirin 81 MG tablet Take 81 mg by mouth daily.      [name] glimepiride (AMARYL) 4 MG tablet Take 2 mg by mouth daily before breakfast.      . Glucosamine-Chondroitin-Vit D3 1500-1200-800 MG-MG-UNIT PACK Take 2 capsules by mouth.       [name] lisinopril (PRINIVIL,ZESTRIL) 10 MG tablet Take 1 tablet (10 mg total) by mouth daily.  30 tablet  11  . meloxicam (MOBIC) 15 MG tablet Take 1 tablet (15 mg total) by mouth daily.  30 tablet  11  . metFORMIN (GLUCOPHAGE) 1000 MG tablet Take 1 tablet (1,000 mg total) by mouth daily with breakfast.  30 tablet  11  . Multiple Vitamin (MULTIVITAMIN) tablet Take 1 tablet by mouth daily.      . niacin (NIASPAN) 1000 MG CR tablet Take 1 tablet (1,000 mg total) by mouth at bedtime.  30 tablet  11  . Omega-3 Fatty Acids (FISH OIL) 600 MG CAPS Take 3,000 mg by mouth daily.       [name] omeprazole (PRILOSEC) 40 MG capsule Take 1 capsule (40 mg total) by mouth at bedtime.  30 capsule  11  . simvastatin (ZOCOR) 40 MG tablet Take 1 tablet (40 mg total) by mouth every evening.  30 tablet  11  . sitaGLIPtin (JANUVIA) 50 MG tablet Take 1 tablet (50 mg total) by mouth daily.  30 tablet  11   No current facility-administered medications on file prior to visit.

## 2014-08-22 ENCOUNTER — Encounter: Payer: Self-pay | Admitting: Pulmonary Disease

## 2014-08-22 NOTE — Telephone Encounter (Signed)
ATC x 2. Unable to reach pt. VM not set up yet.

## 2014-08-22 NOTE — Telephone Encounter (Signed)
LMTCB-letter has been placed at front for pick up as well.

## 2014-08-22 NOTE — Telephone Encounter (Signed)
Pt calling again for results of download can be reached @ [phone].Austin Ortiz

## 2014-08-22 NOTE — Telephone Encounter (Signed)
Auto CPAP 08/14/14 to 08/20/14 >> used on 7 of 7 nights with average 7 hrs and 1 min.  Average AHI is 3.7 with median CPAP 11 cm H2O and 95 th percentile CPAP 14 cm H20.  Will have my nurse inform pt that CPAP report looks excellent.  I have written a letter stating that he is compliant with therapy.  He can pick this up, and take to [facility].

## 2014-08-25 ENCOUNTER — Encounter: Payer: Self-pay | Admitting: Nurse Practitioner

## 2014-08-25 ENCOUNTER — Telehealth: Payer: Self-pay | Admitting: Pulmonary Disease

## 2014-08-25 NOTE — Telephone Encounter (Signed)
Spoke with Amy at [employer] is aware that we only have the 1 week of CPAP usage and will need to have patient continue wearing CPAP every night and obtain another compliance report in 3 additional weeks from [place]. Amy is aware that we can fax the report to her once we have it and reviewed by VS. Please fax report to Attn: Amy at [phone]. This is for patients DOT needs. Thanks.   Will forward to Ashytn and VS to take care of in 3 weeks or so.

## 2014-08-25 NOTE — Telephone Encounter (Signed)
Pt returned call.  [phone] or [phone]

## 2014-08-25 NOTE — Telephone Encounter (Signed)
Pt aware that letter is written and ready for pick up. Nothing further needed.

## 2014-09-15 ENCOUNTER — Encounter: Payer: Self-pay | Admitting: Nurse Practitioner

## 2014-09-15 ENCOUNTER — Ambulatory Visit (INDEPENDENT_AMBULATORY_CARE_PROVIDER_SITE_OTHER): Payer: BC Managed Care – PPO | Admitting: Nurse Practitioner

## 2014-09-15 VITALS — BP 158/82 | HR 76 | Temp 98.6°F | Ht 65.5 in | Wt 202.6 lb

## 2014-09-15 DIAGNOSIS — Z0000 Encounter for general adult medical examination without abnormal findings: Secondary | ICD-10-CM

## 2014-09-15 DIAGNOSIS — I10 Essential (primary) hypertension: Secondary | ICD-10-CM

## 2014-09-15 DIAGNOSIS — Z125 Encounter for screening for malignant neoplasm of prostate: Secondary | ICD-10-CM

## 2014-09-15 DIAGNOSIS — E785 Hyperlipidemia, unspecified: Secondary | ICD-10-CM

## 2014-09-15 DIAGNOSIS — R1319 Other dysphagia: Secondary | ICD-10-CM

## 2014-09-15 DIAGNOSIS — Z0289 Encounter for other administrative examinations: Secondary | ICD-10-CM

## 2014-09-15 DIAGNOSIS — G4733 Obstructive sleep apnea (adult) (pediatric): Secondary | ICD-10-CM

## 2014-09-15 DIAGNOSIS — E119 Type 2 diabetes mellitus without complications: Secondary | ICD-10-CM

## 2014-09-15 DIAGNOSIS — K2100 Gastro-esophageal reflux disease with esophagitis, without bleeding: Secondary | ICD-10-CM

## 2014-09-15 LAB — POCT URINALYSIS DIPSTICK
Bilirubin, UA: NEGATIVE
GLUCOSE UA: 250
KETONES UA: NEGATIVE
Leukocytes, UA: NEGATIVE
Nitrite, UA: NEGATIVE
Protein, UA: NEGATIVE
SPEC GRAV UA: 1.01
Urobilinogen, UA: NEGATIVE
pH, UA: 7

## 2014-09-15 LAB — POCT UA - MICROALBUMIN: Microalbumin Ur, POC: 20 mg/L

## 2014-09-15 LAB — POCT GLYCOSYLATED HEMOGLOBIN (HGB A1C): Hemoglobin A1C: 7.2

## 2014-09-15 MED ORDER — SITAGLIPTIN PHOSPHATE 50 MG PO TABS: 50.0000 mg | ORAL_TABLET | Freq: Every day | ORAL | Status: DC

## 2014-09-15 MED ORDER — OMEPRAZOLE 40 MG PO CPDR: 40.0000 mg | DELAYED_RELEASE_CAPSULE | Freq: Every day | ORAL | Status: DC

## 2014-09-15 MED ORDER — SIMVASTATIN 40 MG PO TABS: 40.0000 mg | ORAL_TABLET | Freq: Every evening | ORAL | Status: DC

## 2014-09-15 MED ORDER — METFORMIN HCL 1000 MG PO TABS: 1000.0000 mg | ORAL_TABLET | Freq: Every day | ORAL | Status: DC

## 2014-09-15 MED ORDER — LISINOPRIL-HYDROCHLOROTHIAZIDE 20-12.5 MG PO TABS
1.0000 | ORAL_TABLET | Freq: Every day | ORAL | Status: DC
Start: 2014-09-15 — End: 2015-03-17

## 2014-09-15 NOTE — Progress Notes (Addendum)
Subjective:    Patient ID: Austin Ortiz, male    DOB: 1951/03/20, 63 y.o.   MRN: [id number]  Patient here for CPE and DOT. No complaints today.  Hypertension This is a chronic problem. The current episode started more than 1 year ago. The problem has been waxing and waning since onset. The problem is uncontrolled. Pertinent negatives include no blurred vision, headaches, neck pain, palpitations, peripheral edema or shortness of breath. There are no associated agents to hypertension. Risk factors for coronary artery disease include diabetes mellitus, dyslipidemia, family history, male gender and obesity. Past treatments include ACE inhibitors. The current treatment provides moderate improvement. Compliance problems include diet and exercise.   Hyperlipidemia This is a chronic problem. The current episode started more than 1 year ago. The problem is controlled. Recent lipid tests were reviewed and are normal. Exacerbating diseases include diabetes and obesity. He has no history of hypothyroidism. There are no known factors aggravating his hyperlipidemia. Pertinent negatives include no shortness of breath. Current antihyperlipidemic treatment includes statins. The current treatment provides moderate improvement of lipids. Compliance problems include adherence to diet and adherence to exercise.  Risk factors for coronary artery disease include diabetes mellitus, dyslipidemia, family history, hypertension and male sex.  Diabetes He presents for his follow-up diabetic visit. He has type 2 diabetes mellitus. No MedicAlert identification noted. His disease course has been stable. Pertinent negatives for hypoglycemia include no headaches. Pertinent negatives for diabetes include no blurred vision, no foot paresthesias, no polydipsia, no polyphagia, no polyuria, no weakness and no weight loss. Symptoms are stable. His weight is stable. He has not had a previous visit with a dietician. He rarely participates in  exercise. His breakfast blood glucose is taken between 8-9 am. His breakfast blood glucose range is generally 110-130 mg/dl. An ACE inhibitor/angiotensin II receptor blocker is being taken. He does not see a podiatrist.Eye exam is not current.  GERD Currently on omeprazole 40 mg daily- doing well no symptoms. Sleep apnea Was diagnosed years ago but lost a lot of weight and was told could stop using- then found out that needs to pass DOT- Had new study done and got new CPAP machine- just started using on August 29,2015 and has used very night according to report.   Review of Systems  Constitutional: Negative for weight loss.  Eyes: Negative for blurred vision.  Respiratory: Negative for shortness of breath.   Cardiovascular: Negative for palpitations.  Endocrine: Negative for polydipsia, polyphagia and polyuria.  Musculoskeletal: Negative for neck pain.  Neurological: Negative for weakness and headaches.       Objective:   Physical Exam  Constitutional: He is oriented to person, place, and time. He appears well-developed and well-nourished.  HENT:  Head: Normocephalic.  Right Ear: External ear normal.  Left Ear: External ear normal.  Nose: Nose normal.  Mouth/Throat: Oropharynx is clear and moist.  Eyes: EOM are normal. Pupils are equal, round, and reactive to light.  Neck: Normal range of motion. Neck supple. No JVD present. No thyromegaly present.  Cardiovascular: Normal rate, regular rhythm, normal heart sounds and intact distal pulses.  Exam reveals no gallop and no friction rub.   No murmur heard. Pulmonary/Chest: Effort normal and breath sounds normal. No respiratory distress. He has no wheezes. He has no rales. He exhibits no tenderness.  Abdominal: Soft. Bowel sounds are normal. He exhibits no mass. There is no tenderness.  Genitourinary: Penis normal.  Refuses prostate examination   Musculoskeletal: Normal range of motion. He  exhibits no edema.  Lymphadenopathy:    He  has no cervical adenopathy.  Neurological: He is alert and oriented to person, place, and time. No cranial nerve deficit.  Skin: Skin is warm and dry.  Psychiatric: He has a normal mood and affect. His behavior is normal. Judgment and thought content normal.    BP 158/82  Pulse 76  Temp(Src) 98.6 F (37 C) (Oral)  Ht 5' 5.5" (1.664 m)  Wt 202 lb 9.6 oz (91.899 kg)  BMI 33.19 kg/m2   Results for orders placed in visit on 09/15/14  POCT URINALYSIS DIPSTICK      Result Value Ref Range   Color, UA GOLD     Clarity, UA CLEAR     Glucose, UA 250     Bilirubin, UA NEG     Ketones, UA NEG     Spec Grav, UA 1.010     Blood, UA TRACE     pH, UA 7.0     Protein, UA NEG     Urobilinogen, UA negative     Nitrite, UA NEG     Leukocytes, UA Negative    POCT GLYCOSYLATED HEMOGLOBIN (HGB A1C)      Result Value Ref Range   Hemoglobin A1C 7.2    POCT UA - MICROALBUMIN      Result Value Ref Range   Microalbumin Ur, POC 20           Assessment & Plan:  1. Annual physical exam - POCT urinalysis dipstick  2. Obstructive sleep apnea Use CPAP nightly  3. Hyperlipidemia Low fat diet - NMR, lipoprofile - simvastatin (ZOCOR) 40 MG tablet; Take 1 tablet (40 mg total) by mouth every evening.  Dispense: 30 tablet; Refill: 11  4. Essential hypertension, benign Low Na diet - CMP14+EGFR Stopped lisinopril and changed to lisinopril/hctz - lisinopril-hydrochlorothiazide (ZESTORETIC) 20-12.5 MG per tablet; Take 1 tablet by mouth daily.  Dispense: 30 tablet; Refill: 5  5. Diabetes mellitus type 2, uncontrolled, without complications Watch carbs - POCT glycosylated hemoglobin (Hb A1C) - POCT UA - Microalbumin - Microalbumin, urine  sitaGLIPtin (JANUVIA) 50 MG tablet; Take 1 tablet (50 mg total) by mouth daily.  Dispense: 30 tablet; Refill: 11 - metFORMIN (GLUCOPHAGE) 1000 MG tablet; Take 1 tablet (1,000 mg total) by mouth daily with breakfast.  Dispense: 30 tablet; Refill: 11  6. Other  dysphagia  7. Reflux esophagitis Do  Not eat 2 hours prior to bedtime - omeprazole (PRILOSEC) 40 MG capsule; Take 1 capsule (40 mg total) by mouth at bedtime.  Dispense: 30 capsule; Refill: 11  8. Prostate cancer screening - PSA, total and free  Labs pending Health maintenance reviewed Diet and exercise encouraged Continue all meds Follow up  In 3 month   [place], FNP

## 2014-09-15 NOTE — Patient Instructions (Signed)

## 2014-09-15 NOTE — Addendum Note (Signed)
Addended by: Chevis Pretty on: 09/15/2014 04:00 PM   Modules accepted: Level of Service

## 2014-09-16 ENCOUNTER — Encounter (HOSPITAL_BASED_OUTPATIENT_CLINIC_OR_DEPARTMENT_OTHER): Payer: BC Managed Care – PPO

## 2014-09-16 LAB — NMR, LIPOPROFILE
CHOLESTEROL: 130 mg/dL (ref 100–199)
HDL CHOLESTEROL BY NMR: 39 mg/dL — AB (ref >39)
HDL Particle Number: 32.1 umol/L (ref >=30.5)
LDL PARTICLE NUMBER: 739 nmol/L (ref <1000)
LDL SIZE: 20.1 nm (ref >20.5)
LDLC SERPL CALC-MCNC: 63 mg/dL (ref 0–99)
LP-IR Score: 72 — ABNORMAL HIGH (ref <=45)
Small LDL Particle Number: 486 nmol/L (ref <=527)
Triglycerides by NMR: 140 mg/dL (ref 0–149)

## 2014-09-16 LAB — CMP14+EGFR
A/G RATIO: 1.8 (ref 1.1–2.5)
ALT: 23 IU/L (ref 0–44)
AST: 16 IU/L (ref 0–40)
Albumin: 4.9 g/dL — ABNORMAL HIGH (ref 3.6–4.8)
Alkaline Phosphatase: 53 IU/L (ref 39–117)
BUN/Creatinine Ratio: 20 (ref 10–22)
BUN: 16 mg/dL (ref 8–27)
CALCIUM: 10 mg/dL (ref 8.6–10.2)
CHLORIDE: 98 mmol/L (ref 97–108)
CO2: 20 mmol/L (ref 18–29)
Creatinine, Ser: 0.81 mg/dL (ref 0.76–1.27)
GFR, EST AFRICAN AMERICAN: 111 mL/min/{1.73_m2} (ref >59)
GFR, EST NON AFRICAN AMERICAN: 96 mL/min/{1.73_m2} (ref >59)
GLUCOSE: 129 mg/dL — AB (ref 65–99)
Globulin, Total: 2.7 g/dL (ref 1.5–4.5)
Potassium: 5.1 mmol/L (ref 3.5–5.2)
Sodium: 140 mmol/L (ref 134–144)
TOTAL PROTEIN: 7.6 g/dL (ref 6.0–8.5)
Total Bilirubin: 0.4 mg/dL (ref 0.0–1.2)

## 2014-09-16 LAB — PSA, TOTAL AND FREE
PSA, Free Pct: 54 %
PSA, Free: 0.27 ng/mL
PSA: 0.5 ng/mL (ref 0.0–4.0)

## 2014-09-16 LAB — MICROALBUMIN, URINE: Microalbumin, Urine: 9.7 ug/mL (ref 0.0–17.0)

## 2014-09-16 NOTE — Telephone Encounter (Signed)
Don't have the follow up download.  Please have his DME send.

## 2014-09-16 NOTE — Telephone Encounter (Signed)
Has this message been taken care of? Thanks.

## 2014-09-16 NOTE — Telephone Encounter (Signed)
Called Psychiatrist and [employer] x1 for download

## 2014-09-17 NOTE — Telephone Encounter (Signed)
Called [facility] LM on voicemail for [employer], RT to return call or fax recent D/L for pt CPAP

## 2014-09-17 NOTE — Telephone Encounter (Signed)
D/L received. Placed in VS look at Please advise. Thanks.

## 2014-09-18 ENCOUNTER — Encounter: Payer: Self-pay | Admitting: Pulmonary Disease

## 2014-09-18 ENCOUNTER — Telehealth: Payer: Self-pay | Admitting: Pulmonary Disease

## 2014-09-18 NOTE — Telephone Encounter (Addendum)
LMTCB x 1 -  Letter and CPAP D/L has been faxed to [facility] @ [phone] ATTN: Amy

## 2014-09-18 NOTE — Telephone Encounter (Signed)
Letter and CPAP D/L has been faxed to [facility] @ [phone] ATTN: Amy Nothing further needed.

## 2014-09-18 NOTE — Telephone Encounter (Signed)
CPAP 08/16/14 to 09/14/14 >> used on 24 of 30 nights with average 6 hrs and 54 min.   Will have my nurse inform pt that CPAP report shows acceptable usage of CPAP.

## 2014-09-19 NOTE — Telephone Encounter (Signed)
Spoke with pt, aware that [facility] has everything requested. Nothing further needed.

## 2014-09-19 NOTE — Telephone Encounter (Signed)
ATC x 2 cont ringing NA wcb Make pt aware of the below  Spoke with Lattie Haw in Medical Records @ [facility] and she verified that letter and records have been received and will be given to Amy on Monday as she is not in the office today.

## 2014-09-22 ENCOUNTER — Other Ambulatory Visit: Payer: Self-pay | Admitting: *Deleted

## 2014-09-22 DIAGNOSIS — I10 Essential (primary) hypertension: Secondary | ICD-10-CM

## 2014-09-22 DIAGNOSIS — M1990 Unspecified osteoarthritis, unspecified site: Secondary | ICD-10-CM

## 2014-09-22 MED ORDER — NIACIN ER (ANTIHYPERLIPIDEMIC) 1000 MG PO TBCR: 1000.0000 mg | EXTENDED_RELEASE_TABLET | Freq: Every day | ORAL | Status: DC

## 2014-09-22 MED ORDER — MELOXICAM 15 MG PO TABS: 15.0000 mg | ORAL_TABLET | Freq: Every day | ORAL | Status: DC

## 2014-11-12 ENCOUNTER — Other Ambulatory Visit: Payer: Self-pay

## 2014-11-12 MED ORDER — GLIMEPIRIDE 4 MG PO TABS: 2.0000 mg | ORAL_TABLET | Freq: Every day | ORAL | Status: DC

## 2014-12-01 ENCOUNTER — Other Ambulatory Visit: Payer: Self-pay | Admitting: Nurse Practitioner

## 2014-12-15 ENCOUNTER — Other Ambulatory Visit: Payer: Self-pay | Admitting: Nurse Practitioner

## 2014-12-15 ENCOUNTER — Other Ambulatory Visit: Payer: Self-pay

## 2014-12-15 DIAGNOSIS — M1990 Unspecified osteoarthritis, unspecified site: Secondary | ICD-10-CM

## 2014-12-15 MED ORDER — MELOXICAM 15 MG PO TABS: 15.0000 mg | ORAL_TABLET | Freq: Every day | ORAL | Status: DC

## 2015-01-05 ENCOUNTER — Other Ambulatory Visit: Payer: Self-pay | Admitting: Nurse Practitioner

## 2015-01-09 ENCOUNTER — Other Ambulatory Visit: Payer: Self-pay | Admitting: *Deleted

## 2015-01-09 NOTE — Telephone Encounter (Signed)
Calling to verify dosage of amaryl

## 2015-01-15 ENCOUNTER — Other Ambulatory Visit: Payer: Self-pay | Admitting: Family Medicine

## 2015-01-15 ENCOUNTER — Other Ambulatory Visit: Payer: Self-pay | Admitting: Nurse Practitioner

## 2015-02-13 ENCOUNTER — Other Ambulatory Visit: Payer: Self-pay | Admitting: Nurse Practitioner

## 2015-02-16 NOTE — Telephone Encounter (Signed)
Last seen 09/15/14 MMM

## 2015-02-17 ENCOUNTER — Other Ambulatory Visit: Payer: Self-pay | Admitting: Nurse Practitioner

## 2015-02-18 NOTE — Telephone Encounter (Signed)
Please advise on refills last seen 09/15/14, no follow up scheduled.

## 2015-02-18 NOTE — Telephone Encounter (Signed)
no more refills without being seen  

## 2015-02-19 NOTE — Telephone Encounter (Signed)
lmovm that refills went to pharmacy & to call the office for a 6 month checkup appointment

## 2015-03-17 ENCOUNTER — Other Ambulatory Visit: Payer: Self-pay | Admitting: Family Medicine

## 2015-03-17 ENCOUNTER — Other Ambulatory Visit: Payer: Self-pay | Admitting: Nurse Practitioner

## 2015-03-19 ENCOUNTER — Other Ambulatory Visit: Payer: Self-pay | Admitting: Nurse Practitioner

## 2015-04-02 ENCOUNTER — Ambulatory Visit (INDEPENDENT_AMBULATORY_CARE_PROVIDER_SITE_OTHER): Payer: BC Managed Care – PPO | Admitting: Family

## 2015-04-02 ENCOUNTER — Encounter: Payer: Self-pay | Admitting: Family

## 2015-04-02 VITALS — BP 130/79 | HR 75 | Temp 97.6°F | Ht 65.0 in | Wt 200.2 lb

## 2015-04-02 DIAGNOSIS — K2100 Gastro-esophageal reflux disease with esophagitis, without bleeding: Secondary | ICD-10-CM

## 2015-04-02 DIAGNOSIS — E785 Hyperlipidemia, unspecified: Secondary | ICD-10-CM

## 2015-04-02 DIAGNOSIS — K219 Gastro-esophageal reflux disease without esophagitis: Secondary | ICD-10-CM | POA: Insufficient documentation

## 2015-04-02 DIAGNOSIS — Z1321 Encounter for screening for nutritional disorder: Secondary | ICD-10-CM

## 2015-04-02 DIAGNOSIS — IMO0001 Reserved for inherently not codable concepts without codable children: Secondary | ICD-10-CM

## 2015-04-02 DIAGNOSIS — G4733 Obstructive sleep apnea (adult) (pediatric): Secondary | ICD-10-CM | POA: Diagnosis not present

## 2015-04-02 DIAGNOSIS — I10 Essential (primary) hypertension: Secondary | ICD-10-CM | POA: Diagnosis not present

## 2015-04-02 DIAGNOSIS — E1165 Type 2 diabetes mellitus with hyperglycemia: Secondary | ICD-10-CM | POA: Diagnosis not present

## 2015-04-02 LAB — POCT GLYCOSYLATED HEMOGLOBIN (HGB A1C): Hemoglobin A1C: 7.9

## 2015-04-02 LAB — POCT UA - MICROALBUMIN: MICROALBUMIN (UR) POC: 20 mg/L

## 2015-04-02 MED ORDER — SIMVASTATIN 40 MG PO TABS: 40.0000 mg | ORAL_TABLET | Freq: Every evening | ORAL | Status: DC

## 2015-04-02 MED ORDER — NIACIN ER (ANTIHYPERLIPIDEMIC) 1000 MG PO TBCR: EXTENDED_RELEASE_TABLET | ORAL | Status: DC

## 2015-04-02 MED ORDER — SITAGLIPTIN PHOSPHATE 50 MG PO TABS: 50.0000 mg | ORAL_TABLET | Freq: Every day | ORAL | Status: DC

## 2015-04-02 MED ORDER — METFORMIN HCL 1000 MG PO TABS: 1000.0000 mg | ORAL_TABLET | Freq: Every day | ORAL | Status: DC

## 2015-04-02 MED ORDER — OMEPRAZOLE 40 MG PO CPDR: 40.0000 mg | DELAYED_RELEASE_CAPSULE | Freq: Every day | ORAL | Status: DC

## 2015-04-02 MED ORDER — LISINOPRIL-HYDROCHLOROTHIAZIDE 20-12.5 MG PO TABS: 1.0000 | ORAL_TABLET | Freq: Every day | ORAL | Status: DC

## 2015-04-02 MED ORDER — GLIMEPIRIDE 4 MG PO TABS: ORAL_TABLET | ORAL | Status: DC

## 2015-04-02 NOTE — Addendum Note (Signed)
Addended by: Earlene Plater on: 04/02/2015 12:09 PM   Modules accepted: Orders

## 2015-04-02 NOTE — Patient Instructions (Signed)
Type 2 Diabetes Mellitus Type 2 diabetes mellitus, often simply referred to as type 2 diabetes, is a long-lasting (chronic) disease. In type 2 diabetes, the pancreas does not make enough insulin (a hormone), the cells are less responsive to the insulin that is made (insulin resistance), or both. Normally, insulin moves sugars from food into the tissue cells. The tissue cells use the sugars for energy. The lack of insulin or the lack of normal response to insulin causes excess sugars to build up in the blood instead of going into the tissue cells. As a result, high blood sugar (hyperglycemia) develops. The effect of high sugar (glucose) levels can cause many complications. Type 2 diabetes was also previously called adult-onset diabetes, but it can occur at any age.  RISK FACTORS  A person is predisposed to developing type 2 diabetes if someone in the family has the disease and also has one or more of the following primary risk factors:  Overweight.  An inactive lifestyle.  A history of consistently eating high-calorie foods. Maintaining a normal weight and regular physical activity can reduce the chance of developing type 2 diabetes. SYMPTOMS  A person with type 2 diabetes may not show symptoms initially. The symptoms of type 2 diabetes appear slowly. The symptoms include:  Increased thirst (polydipsia).  Increased urination (polyuria).  Increased urination during the night (nocturia).  Weight loss. This weight loss may be rapid.  Frequent, recurring infections.  Tiredness (fatigue).  Weakness.  Vision changes, such as blurred vision.  Fruity smell to your breath.  Abdominal pain.  Nausea or vomiting.  Cuts or bruises which are slow to heal.  Tingling or numbness in the hands or feet. DIAGNOSIS Type 2 diabetes is frequently not diagnosed until complications of diabetes are present. Type 2 diabetes is diagnosed when symptoms or complications are present and when blood  glucose levels are increased. Your blood glucose level may be checked by one or more of the following blood tests:  A fasting blood glucose test. You will not be allowed to eat for at least 8 hours before a blood sample is taken.  A random blood glucose test. Your blood glucose is checked at any time of the day regardless of when you ate.  A hemoglobin A1c blood glucose test. A hemoglobin A1c test provides information about blood glucose control over the previous 3 months.  An oral glucose tolerance test (OGTT). Your blood glucose is measured after you have not eaten (fasted) for 2 hours and then after you drink a glucose-containing beverage. TREATMENT   You may need to take insulin or diabetes medicine daily to keep blood glucose levels in the desired range.  If you use insulin, you may need to adjust the dosage depending on the carbohydrates that you eat with each meal or snack. The treatment goal is to maintain the before meal blood sugar (preprandial glucose) level at 70-130 mg/dL. HOME CARE INSTRUCTIONS   Have your hemoglobin A1c level checked twice a year.  Perform daily blood glucose monitoring as directed by your health care provider.  Monitor urine ketones when you are ill and as directed by your health care provider.  Take your diabetes medicine or insulin as directed by your health care provider to maintain your blood glucose levels in the desired range.  Never run out of diabetes medicine or insulin. It is needed every day.  If you are using insulin, you may need to adjust the amount of insulin given based on your intake of   carbohydrates. Carbohydrates can raise blood glucose levels but need to be included in your diet. Carbohydrates provide vitamins, minerals, and fiber which are an essential part of a healthy diet. Carbohydrates are found in fruits, vegetables, whole grains, dairy products, legumes, and foods containing added sugars.  Eat healthy foods. You should make an  appointment to see a registered dietitian to help you create an eating plan that is right for you.  Lose weight if you are overweight.  Carry a medical alert card or wear your medical alert jewelry.  Carry a 15-gram carbohydrate snack with you at all times to treat low blood glucose (hypoglycemia). Some examples of 15-gram carbohydrate snacks include:  Glucose tablets, 3 or 4.  Glucose gel, 15-gram tube.  Raisins, 2 tablespoons (24 grams).  Jelly beans, 6.  Animal crackers, 8.  Regular pop, 4 ounces (120 mL).  Gummy treats, 9.  Recognize hypoglycemia. Hypoglycemia occurs with blood glucose levels of 70 mg/dL and below. The risk for hypoglycemia increases when fasting or skipping meals, during or after intense exercise, and during sleep. Hypoglycemia symptoms can include:  Tremors or shakes.  Decreased ability to concentrate.  Sweating.  Increased heart rate.  Headache.  Dry mouth.  Hunger.  Irritability.  Anxiety.  Restless sleep.  Altered speech or coordination.  Confusion.  Treat hypoglycemia promptly. If you are alert and able to safely swallow, follow the 15:15 rule:  Take 15-20 grams of rapid-acting glucose or carbohydrate. Rapid-acting options include glucose gel, glucose tablets, or 4 ounces (120 mL) of fruit juice, regular soda, or low-fat milk.  Check your blood glucose level 15 minutes after taking the glucose.  Take 15-20 grams more of glucose if the repeat blood glucose level is still 70 mg/dL or below.  Eat a meal or snack within 1 hour once blood glucose levels return to normal.  Be alert to feeling very thirsty and urinating more frequently than usual, which are early signs of hyperglycemia. An early awareness of hyperglycemia allows for prompt treatment. Treat hyperglycemia as directed by your health care provider.  Engage in at least 150 minutes of moderate-intensity physical activity a week, spread over at least 3 days of the week or as  directed by your health care provider. In addition, you should engage in resistance exercise at least 2 times a week or as directed by your health care provider. Try to spend no more than 90 minutes at one time inactive.  Adjust your medicine and food intake as needed if you start a new exercise or sport.  Follow your sick-day plan anytime you are unable to eat or drink as usual.  Do not use any tobacco products including cigarettes, chewing tobacco, or electronic cigarettes. If you need help quitting, ask your health care provider.  Limit alcohol intake to no more than 1 drink per day for nonpregnant women and 2 drinks per day for men. You should drink alcohol only when you are also eating food. Talk with your health care provider whether alcohol is safe for you. Tell your health care provider if you drink alcohol several times a week.  Keep all follow-up visits as directed by your health care provider. This is important.  Schedule an eye exam soon after the diagnosis of type 2 diabetes and then annually.  Perform daily skin and foot care. Examine your skin and feet daily for cuts, bruises, redness, nail problems, bleeding, blisters, or sores. A foot exam by a health care provider should be done annually.    Brush your teeth and gums at least twice a day and floss at least once a day. Follow up with your dentist regularly.  Share your diabetes management plan with your workplace or school.  Stay up-to-date with immunizations. It is recommended that people with diabetes who are over 65 years old get the pneumonia vaccine. In some cases, two separate shots may be given. Ask your health care provider if your pneumonia vaccination is up-to-date.  Learn to manage stress.  Obtain ongoing diabetes education and support as needed.  Participate in or seek rehabilitation as needed to maintain or improve independence and quality of life. Request a physical or occupational therapy referral if you are  having foot or hand numbness, or difficulties with grooming, dressing, eating, or physical activity. SEEK MEDICAL CARE IF:   You are unable to eat food or drink fluids for more than 6 hours.  You have nausea and vomiting for more than 6 hours.  Your blood glucose level is over 240 mg/dL.  There is a change in mental status.  You develop an additional serious illness.  You have diarrhea for more than 6 hours.  You have been sick or have had a fever for a couple of days and are not getting better.  You have pain during any physical activity.  SEEK IMMEDIATE MEDICAL CARE IF:  You have difficulty breathing.  You have moderate to large ketone levels. MAKE SURE YOU:  Understand these instructions.  Will watch your condition.  Will get help right away if you are not doing well or get worse. Document Released: 12/12/2005 Document Revised: 04/28/2014 Document Reviewed: 07/10/2012 Exit[employer] Patient Information 2015 [employer]. This information is not intended to replace advice given to you by your health care provider. Make sure you discuss any questions you have with your health care provider. Health Maintenance A healthy lifestyle and preventative care can promote health and wellness.  Maintain regular health, dental, and eye exams.  Eat a healthy diet. Foods like vegetables, fruits, whole grains, low-fat dairy products, and lean protein foods contain the nutrients you need and are low in calories. Decrease your intake of foods high in solid fats, added sugars, and salt. Get information about a proper diet from your health care provider, if necessary.  Regular physical exercise is one of the most important things you can do for your health. Most adults should get at least 150 minutes of moderate-intensity exercise (any activity that increases your heart rate and causes you to sweat) each week. In addition, most adults need muscle-strengthening exercises on 2 or more days a week.    Maintain a healthy weight. The body mass index (BMI) is a screening tool to identify possible weight problems. It provides an estimate of body fat based on height and weight. Your health care provider can find your BMI and can help you achieve or maintain a healthy weight. For males 20 years and older:  A BMI below 18.5 is considered underweight.  A BMI of 18.5 to 24.9 is normal.  A BMI of 25 to 29.9 is considered overweight.  A BMI of 30 and above is considered obese.  Maintain normal blood lipids and cholesterol by exercising and minimizing your intake of saturated fat. Eat a balanced diet with plenty of fruits and vegetables. Blood tests for lipids and cholesterol should begin at age 20 and be repeated every 5 years. If your lipid or cholesterol levels are high, you are over age 50, or you are at high   risk for heart disease, you may need your cholesterol levels checked more frequently.Ongoing high lipid and cholesterol levels should be treated with medicines if diet and exercise are not working.  If you smoke, find out from your health care provider how to quit. If you do not use tobacco, do not start.  Lung cancer screening is recommended for adults aged 55-80 years who are at high risk for developing lung cancer because of a history of smoking. A yearly low-dose CT scan of the lungs is recommended for people who have at least a 30-pack-year history of smoking and are current smokers or have quit within the past 15 years. A pack year of smoking is smoking an average of 1 pack of cigarettes a day for 1 year (for example, a 30-pack-year history of smoking could mean smoking 1 pack a day for 30 years or 2 packs a day for 15 years). Yearly screening should continue until the smoker has stopped smoking for at least 15 years. Yearly screening should be stopped for people who develop a health problem that would prevent them from having lung cancer treatment.  If you choose to drink alcohol, do not  have more than 2 drinks per day. One drink is considered to be 12 oz (360 mL) of beer, 5 oz (150 mL) of wine, or 1.5 oz (45 mL) of liquor.  Avoid the use of street drugs. Do not share needles with anyone. Ask for help if you need support or instructions about stopping the use of drugs.  High blood pressure causes heart disease and increases the risk of stroke. Blood pressure should be checked at least every 1-2 years. Ongoing high blood pressure should be treated with medicines if weight loss and exercise are not effective.  If you are 45-79 years old, ask your health care provider if you should take aspirin to prevent heart disease.  Diabetes screening involves taking a blood sample to check your fasting blood sugar level. This should be done once every 3 years after age 45 if you are at a normal weight and without risk factors for diabetes. Testing should be considered at a younger age or be carried out more frequently if you are overweight and have at least 1 risk factor for diabetes.  Colorectal cancer can be detected and often prevented. Most routine colorectal cancer screening begins at the age of 50 and continues through age 75. However, your health care provider may recommend screening at an earlier age if you have risk factors for colon cancer. On a yearly basis, your health care provider may provide home test kits to check for hidden blood in the stool. A small camera at the end of a tube may be used to directly examine the colon (sigmoidoscopy or colonoscopy) to detect the earliest forms of colorectal cancer. Talk to your health care provider about this at age 50 when routine screening begins. A direct exam of the colon should be repeated every 5-10 years through age 75, unless early forms of precancerous polyps or small growths are found.  People who are at an increased risk for hepatitis B should be screened for this virus. You are considered at high risk for hepatitis B if:  You were born  in a country where hepatitis B occurs often. Talk with your health care provider about which countries are considered high risk.  Your parents were born in a high-risk country and you have not received a shot to protect against hepatitis B (hepatitis B   vaccine).  You have HIV or AIDS.  You use needles to inject street drugs.  You live with, or have sex with, someone who has hepatitis B.  You are a man who has sex with other men (MSM).  You get hemodialysis treatment.  You take certain medicines for conditions like cancer, organ transplantation, and autoimmune conditions.  Hepatitis C blood testing is recommended for all people born from 1945 through 1965 and any individual with known risk factors for hepatitis C.  Healthy men should no longer receive prostate-specific antigen (PSA) blood tests as part of routine cancer screening. Talk to your health care provider about prostate cancer screening.  Testicular cancer screening is not recommended for adolescents or adult males who have no symptoms. Screening includes self-exam, a health care provider exam, and other screening tests. Consult with your health care provider about any symptoms you have or any concerns you have about testicular cancer.  Practice safe sex. Use condoms and avoid high-risk sexual practices to reduce the spread of sexually transmitted infections (STIs).  You should be screened for STIs, including gonorrhea and chlamydia if:  You are sexually active and are younger than 24 years.  You are older than 24 years, and your health care provider tells you that you are at risk for this type of infection.  Your sexual activity has changed since you were last screened, and you are at an increased risk for chlamydia or gonorrhea. Ask your health care provider if you are at risk.  If you are at risk of being infected with HIV, it is recommended that you take a prescription medicine daily to prevent HIV infection. This is  called pre-exposure prophylaxis (PrEP). You are considered at risk if:  You are a man who has sex with other men (MSM).  You are a heterosexual man who is sexually active with multiple partners.  You take drugs by injection.  You are sexually active with a partner who has HIV.  Talk with your health care provider about whether you are at high risk of being infected with HIV. If you choose to begin PrEP, you should first be tested for HIV. You should then be tested every 3 months for as long as you are taking PrEP.  Use sunscreen. Apply sunscreen liberally and repeatedly throughout the day. You should seek shade when your shadow is shorter than you. Protect yourself by wearing long sleeves, pants, a wide-brimmed hat, and sunglasses year round whenever you are outdoors.  Tell your health care provider of new moles or changes in moles, especially if there is a change in shape or color. Also, tell your health care provider if a mole is larger than the size of a pencil eraser.  A one-time screening for abdominal aortic aneurysm (AAA) and surgical repair of large AAAs by ultrasound is recommended for men aged 65-75 years who are current or former smokers.  Stay current with your vaccines (immunizations). Document Released: 06/09/2008 Document Revised: 12/17/2013 Document Reviewed: 05/09/2011 Exit[employer] Patient Information 2015 [employer]. This information is not intended to replace advice given to you by your health care provider. Make sure you discuss any questions you have with your health care provider.  

## 2015-04-02 NOTE — Progress Notes (Signed)
Subjective:    Patient ID: Austin Ortiz, male    DOB: 08-17-1951, 64 y.o.   MRN: [id number]  Diabetes He presents for his follow-up diabetic visit. He has type 2 diabetes mellitus. No MedicAlert identification noted. His disease course has been stable. Pertinent negatives for hypoglycemia include no headaches. Pertinent negatives for diabetes include no blurred vision, no foot paresthesias, no polydipsia, no polyphagia, no polyuria, no weakness and no weight loss. Symptoms are stable. Pertinent negatives for diabetic complications include no CVA, heart disease, nephropathy or peripheral neuropathy. Risk factors for coronary artery disease include diabetes mellitus, dyslipidemia, hypertension, male sex, post-menopausal and family history. Current diabetic treatment includes oral agent (triple therapy). He is compliant with treatment all of the time. His weight is stable. He has not had a previous visit with a dietitian. He rarely participates in exercise. His breakfast blood glucose is taken between 8-9 am. His breakfast blood glucose range is generally 140-180 mg/dl. An ACE inhibitor/angiotensin II receptor blocker is being taken. He does not see a podiatrist.Eye exam is current.  Hypertension This is a chronic problem. The current episode started more than 1 year ago. The problem has been resolved since onset. The problem is controlled. Pertinent negatives include no blurred vision, headaches, neck pain, palpitations, peripheral edema or shortness of breath. Risk factors for coronary artery disease include diabetes mellitus, dyslipidemia, family history, male gender and obesity. Past treatments include ACE inhibitors and diuretics. The current treatment provides moderate improvement. There is no history of kidney disease, CAD/MI, CVA, heart failure or a thyroid problem. There is no history of sleep apnea.  Hyperlipidemia This is a chronic problem. The current episode started more than 1 year ago. The  problem is controlled. Recent lipid tests were reviewed and are normal. Exacerbating diseases include diabetes. He has no history of hypothyroidism. Pertinent negatives include no leg pain or shortness of breath. Current antihyperlipidemic treatment includes statins. The current treatment provides significant improvement of lipids. Risk factors for coronary artery disease include diabetes mellitus, dyslipidemia, family history, hypertension, male sex and a sedentary lifestyle.  Gastrophageal Reflux He reports no belching, no choking, no coughing, no heartburn, no nausea, no sore throat or no water brash. This is a chronic problem. The current episode started more than 1 year ago. The problem occurs rarely. The problem has been resolved. The symptoms are aggravated by certain foods. Pertinent negatives include no weight loss. He has tried a PPI for the symptoms. The treatment provided significant relief.      Review of Systems  Constitutional: Negative.  Negative for weight loss.  HENT: Negative.  Negative for sore throat.   Eyes: Negative for blurred vision.  Respiratory: Negative.  Negative for cough, choking and shortness of breath.   Cardiovascular: Negative.  Negative for palpitations.  Gastrointestinal: Negative.  Negative for heartburn and nausea.  Endocrine: Negative.  Negative for polydipsia, polyphagia and polyuria.  Genitourinary: Negative.   Musculoskeletal: Negative.  Negative for neck pain.  Neurological: Negative.  Negative for weakness and headaches.  Hematological: Negative.   Psychiatric/Behavioral: Negative.   All other systems reviewed and are negative.      Objective:   Physical Exam  Constitutional: He is oriented to person, place, and time. He appears well-developed and well-nourished. No distress.  HENT:  Head: Normocephalic.  Right Ear: External ear normal.  Left Ear: External ear normal.  Nose: Nose normal.  Mouth/Throat: Oropharynx is clear and moist.  Eyes:  Pupils are equal, round, and reactive to  light. Right eye exhibits no discharge. Left eye exhibits no discharge.  Neck: Normal range of motion. Neck supple. No thyromegaly present.  Cardiovascular: Normal rate, regular rhythm, normal heart sounds and intact distal pulses.   No murmur heard. Pulmonary/Chest: Effort normal and breath sounds normal. No respiratory distress. He has no wheezes.  Abdominal: Soft. Bowel sounds are normal. He exhibits no distension. There is no tenderness.  Musculoskeletal: Normal range of motion. He exhibits no edema or tenderness.  Neurological: He is alert and oriented to person, place, and time. He has normal reflexes. No cranial nerve deficit.  Skin: Skin is warm and dry. No rash noted. No erythema.  Psychiatric: He has a normal mood and affect. His behavior is normal. Judgment and thought content normal.  Vitals reviewed.   See Diabetic foot exam  BP 130/79 mmHg  Pulse 75  Temp(Src) 97.6 F (36.4 C) (Oral)  Ht '5\' 5"'  (1.651 m)  Wt 200 lb 3.2 oz (90.81 kg)  BMI 33.31 kg/m2     Assessment & Plan:  1. Gastroesophageal reflux disease with esophagitis - CMP14+EGFR - omeprazole (PRILOSEC) 40 MG capsule; Take 1 capsule (40 mg total) by mouth at bedtime.  Dispense: 90 capsule; Refill: 3  2. Essential hypertension, benign - CMP14+EGFR - lisinopril-hydrochlorothiazide (PRINZIDE,ZESTORETIC) 20-12.5 MG per tablet; Take 1 tablet by mouth daily.  Dispense: 90 tablet; Refill: 3  3. Obstructive sleep apnea - CMP14+EGFR  4. Diabetes mellitus type 2, uncontrolled, without complications - POCT glycosylated hemoglobin (Hb A1C) - POCT UA - Microalbumin - CMP14+EGFR - sitaGLIPtin (JANUVIA) 50 MG tablet; Take 1 tablet (50 mg total) by mouth daily.  Dispense: 90 tablet; Refill: 3 - metFORMIN (GLUCOPHAGE) 1000 MG tablet; Take 1 tablet (1,000 mg total) by mouth daily with breakfast.  Dispense: 90 tablet; Refill: 3 - glimepiride (AMARYL) 4 MG tablet; TAKE ONE TABLET BY  MOUTH ONCE DAILY BEFORE BREAKFAST NEEDS  APPOINTMENT  Dispense: 90 tablet; Refill: 3  5. Hyperlipidemia - CMP14+EGFR - Lipid panel - simvastatin (ZOCOR) 40 MG tablet; Take 1 tablet (40 mg total) by mouth every evening.  Dispense: 90 tablet; Refill: 3 - niacin (NIASPAN) 1000 MG CR tablet; TAKE ONE TABLET BY MOUTH ONCE DAILY AT BEDTIME. NEEDS  APPOINTMENT  FOR  LABS  Dispense: 90 tablet; Refill: 3  8. Encounter for vitamin deficiency screening - Vit D  25 hydroxy (rtn osteoporosis monitoring)   Continue all meds Labs pending Health Maintenance reviewed- Pt to schedule eye exam Diet and exercise encouraged RTO 3 months  Evelina Dun, FNP

## 2015-04-03 LAB — CMP14+EGFR
A/G RATIO: 1.6 (ref 1.1–2.5)
ALK PHOS: 53 IU/L (ref 39–117)
ALT: 57 IU/L — ABNORMAL HIGH (ref 0–44)
AST: 35 IU/L (ref 0–40)
Albumin: 4.7 g/dL (ref 3.6–4.8)
BUN / CREAT RATIO: 29 — AB (ref 10–22)
BUN: 23 mg/dL (ref 8–27)
Bilirubin Total: 0.4 mg/dL (ref 0.0–1.2)
CO2: 24 mmol/L (ref 18–29)
Calcium: 9.5 mg/dL (ref 8.6–10.2)
Chloride: 97 mmol/L (ref 97–108)
Creatinine, Ser: 0.79 mg/dL (ref 0.76–1.27)
GFR calc non Af Amer: 96 mL/min/{1.73_m2} (ref >59)
GFR, EST AFRICAN AMERICAN: 111 mL/min/{1.73_m2} (ref >59)
Globulin, Total: 2.9 g/dL (ref 1.5–4.5)
Glucose: 176 mg/dL — ABNORMAL HIGH (ref 65–99)
POTASSIUM: 4.9 mmol/L (ref 3.5–5.2)
SODIUM: 136 mmol/L (ref 134–144)
Total Protein: 7.6 g/dL (ref 6.0–8.5)

## 2015-04-03 LAB — LIPID PANEL
Chol/HDL Ratio: 3.4 ratio units (ref 0.0–5.0)
Cholesterol, Total: 128 mg/dL (ref 100–199)
HDL: 38 mg/dL — AB (ref >39)
LDL Calculated: 63 mg/dL (ref 0–99)
TRIGLYCERIDES: 134 mg/dL (ref 0–149)
VLDL Cholesterol Cal: 27 mg/dL (ref 5–40)

## 2015-04-03 LAB — MICROALBUMIN, URINE: Microalbumin, Urine: 8.6 ug/mL (ref 0.0–17.0)

## 2015-04-03 LAB — VITAMIN D 25 HYDROXY (VIT D DEFICIENCY, FRACTURES): Vit D, 25-Hydroxy: 34.5 ng/mL (ref 30.0–100.0)

## 2015-04-06 ENCOUNTER — Telehealth: Payer: Self-pay | Admitting: Family

## 2015-04-06 ENCOUNTER — Other Ambulatory Visit: Payer: Self-pay | Admitting: Family

## 2015-04-06 MED ORDER — CANAGLIFLOZIN 100 MG PO TABS: 100.0000 mg | ORAL_TABLET | Freq: Every day | ORAL | Status: DC

## 2015-04-06 NOTE — Telephone Encounter (Signed)
Pt aware of lab results under lab notes

## 2015-04-17 ENCOUNTER — Other Ambulatory Visit: Payer: Self-pay | Admitting: Family

## 2015-05-26 ENCOUNTER — Other Ambulatory Visit: Payer: Self-pay | Admitting: Family Medicine

## 2015-06-24 ENCOUNTER — Other Ambulatory Visit: Payer: Self-pay | Admitting: Family

## 2015-07-06 ENCOUNTER — Ambulatory Visit: Payer: BC Managed Care – PPO | Admitting: Family

## 2015-07-15 ENCOUNTER — Ambulatory Visit (INDEPENDENT_AMBULATORY_CARE_PROVIDER_SITE_OTHER): Payer: BC Managed Care – PPO | Admitting: Family

## 2015-07-15 ENCOUNTER — Encounter: Payer: Self-pay | Admitting: Family

## 2015-07-15 VITALS — BP 108/70 | HR 66 | Temp 97.9°F | Ht 65.0 in | Wt 198.6 lb

## 2015-07-15 DIAGNOSIS — Z23 Encounter for immunization: Secondary | ICD-10-CM | POA: Diagnosis not present

## 2015-07-15 DIAGNOSIS — K2100 Gastro-esophageal reflux disease with esophagitis, without bleeding: Secondary | ICD-10-CM

## 2015-07-15 DIAGNOSIS — I10 Essential (primary) hypertension: Secondary | ICD-10-CM | POA: Diagnosis not present

## 2015-07-15 DIAGNOSIS — E785 Hyperlipidemia, unspecified: Secondary | ICD-10-CM | POA: Diagnosis not present

## 2015-07-15 DIAGNOSIS — G4733 Obstructive sleep apnea (adult) (pediatric): Secondary | ICD-10-CM | POA: Diagnosis not present

## 2015-07-15 DIAGNOSIS — E1165 Type 2 diabetes mellitus with hyperglycemia: Secondary | ICD-10-CM

## 2015-07-15 DIAGNOSIS — Z1211 Encounter for screening for malignant neoplasm of colon: Secondary | ICD-10-CM

## 2015-07-15 DIAGNOSIS — IMO0001 Reserved for inherently not codable concepts without codable children: Secondary | ICD-10-CM

## 2015-07-15 LAB — POCT GLYCOSYLATED HEMOGLOBIN (HGB A1C): Hemoglobin A1C: 7.3

## 2015-07-15 NOTE — Progress Notes (Signed)
Subjective:    Patient ID: Austin Ortiz, male    DOB: 01-16-1951, 64 y.o.   MRN: [id number]  Pt presents to the office today for chronic follow up.  Diabetes He presents for his follow-up diabetic visit. He has type 2 diabetes mellitus. No MedicAlert identification noted. His disease course has been stable. Pertinent negatives for hypoglycemia include no headaches. Pertinent negatives for diabetes include no blurred vision, no foot paresthesias, no polydipsia, no polyphagia, no polyuria, no weakness and no weight loss. Symptoms are stable. Pertinent negatives for diabetic complications include no CVA, heart disease, nephropathy or peripheral neuropathy. Risk factors for coronary artery disease include diabetes mellitus, dyslipidemia, hypertension, male sex, post-menopausal and family history. Current diabetic treatment includes oral agent (triple therapy). He is compliant with treatment all of the time. His weight is stable. He has not had a previous visit with a dietitian. He rarely participates in exercise. His breakfast blood glucose is taken between 8-9 am. (Pt has not been checking BS ) An ACE inhibitor/angiotensin II receptor blocker is being taken. He does not see a podiatrist.Eye exam is current.  Gastrophageal Reflux He reports no belching, no choking, no coughing, no heartburn, no nausea, no sore throat or no water brash. This is a chronic problem. The current episode started more than 1 year ago. The problem occurs rarely. The problem has been resolved. The symptoms are aggravated by certain foods. Pertinent negatives include no weight loss. He has tried a PPI for the symptoms. The treatment provided significant relief.  Hyperlipidemia This is a chronic problem. The current episode started more than 1 year ago. The problem is controlled. Recent lipid tests were reviewed and are normal. Exacerbating diseases include diabetes. He has no history of hypothyroidism. Pertinent negatives include  no leg pain or shortness of breath. Current antihyperlipidemic treatment includes statins. The current treatment provides significant improvement of lipids. Risk factors for coronary artery disease include diabetes mellitus, dyslipidemia, family history, hypertension, male sex and a sedentary lifestyle.  Hypertension This is a chronic problem. The current episode started more than 1 year ago. The problem has been resolved since onset. The problem is controlled. Pertinent negatives include no blurred vision, headaches, neck pain, palpitations, peripheral edema or shortness of breath. Risk factors for coronary artery disease include diabetes mellitus, dyslipidemia, family history, male gender and obesity. Past treatments include ACE inhibitors and diuretics. The current treatment provides moderate improvement. There is no history of kidney disease, CAD/MI, CVA, heart failure or a thyroid problem. There is no history of sleep apnea.      Review of Systems  Constitutional: Negative.  Negative for weight loss.  HENT: Negative.  Negative for sore throat.   Eyes: Negative for blurred vision.  Respiratory: Negative.  Negative for cough, choking and shortness of breath.   Cardiovascular: Negative.  Negative for palpitations.  Gastrointestinal: Negative.  Negative for heartburn and nausea.  Endocrine: Negative.  Negative for polydipsia, polyphagia and polyuria.  Genitourinary: Negative.   Musculoskeletal: Negative.  Negative for neck pain.  Neurological: Negative.  Negative for weakness and headaches.  Hematological: Negative.   Psychiatric/Behavioral: Negative.   All other systems reviewed and are negative.      Objective:   Physical Exam  Constitutional: He is oriented to person, place, and time. He appears well-developed and well-nourished. No distress.  HENT:  Head: Normocephalic.  Right Ear: External ear normal.  Left Ear: External ear normal.  Nose: Nose normal.  Mouth/Throat: Oropharynx  is clear and moist.  Eyes: Pupils are equal, round, and reactive to light. Right eye exhibits no discharge. Left eye exhibits no discharge.  Neck: Normal range of motion. Neck supple. No thyromegaly present.  Cardiovascular: Normal rate, regular rhythm, normal heart sounds and intact distal pulses.   No murmur heard. Pulmonary/Chest: Effort normal and breath sounds normal. No respiratory distress. He has no wheezes.  Abdominal: Soft. Bowel sounds are normal. He exhibits no distension. There is no tenderness.  Musculoskeletal: Normal range of motion. He exhibits no edema or tenderness.  Neurological: He is alert and oriented to person, place, and time. He has normal reflexes. No cranial nerve deficit.  Skin: Skin is warm and dry. No rash noted. No erythema.  Psychiatric: He has a normal mood and affect. His behavior is normal. Judgment and thought content normal.  Vitals reviewed.     BP 108/70 mmHg  Pulse 66  Temp(Src) 97.9 F (36.6 C) (Oral)  Ht '5\' 5"'  (1.651 m)  Wt 198 lb 9.6 oz (90.084 kg)  BMI 33.05 kg/m2     Assessment & Plan:  1. Essential hypertension, benign - CMP14+EGFR  2. Gastroesophageal reflux disease with esophagitis - CMP14+EGFR  3. Obstructive sleep apnea - CMP14+EGFR  4. Hyperlipidemia - CMP14+EGFR - Lipid panel  5. Diabetes mellitus type 2, uncontrolled, without complications - POCT glycosylated hemoglobin (Hb A1C) - CMP14+EGFR - Ambulatory referral to Ophthalmology  6. Encounter for screening fecal occult blood testing - CMP14+EGFR - Fecal occult blood, imunochemical   Continue all meds Labs pending Health Maintenance reviewed- Zoster vaccine given today Diet and exercise encouraged RTO 3 months  Evelina Dun, FNP

## 2015-07-15 NOTE — Addendum Note (Signed)
Addended by: Shelbie Ammons on: 07/15/2015 08:47 AM   Modules accepted: Orders

## 2015-07-15 NOTE — Patient Instructions (Signed)

## 2015-07-16 ENCOUNTER — Other Ambulatory Visit: Payer: Self-pay | Admitting: Family

## 2015-07-16 ENCOUNTER — Telehealth: Payer: Self-pay | Admitting: *Deleted

## 2015-07-16 LAB — LIPID PANEL
CHOLESTEROL TOTAL: 117 mg/dL (ref 100–199)
Chol/HDL Ratio: 3.1 ratio units (ref 0.0–5.0)
HDL: 38 mg/dL — ABNORMAL LOW (ref >39)
LDL CALC: 48 mg/dL (ref 0–99)
Triglycerides: 156 mg/dL — ABNORMAL HIGH (ref 0–149)
VLDL CHOLESTEROL CAL: 31 mg/dL (ref 5–40)

## 2015-07-16 LAB — CMP14+EGFR
ALT: 23 IU/L (ref 0–44)
AST: 20 IU/L (ref 0–40)
Albumin/Globulin Ratio: 1.4 (ref 1.1–2.5)
Albumin: 4.2 g/dL (ref 3.6–4.8)
Alkaline Phosphatase: 39 IU/L (ref 39–117)
BUN / CREAT RATIO: 30 — AB (ref 10–22)
BUN: 26 mg/dL (ref 8–27)
Bilirubin Total: 0.5 mg/dL (ref 0.0–1.2)
CO2: 24 mmol/L (ref 18–29)
CREATININE: 0.87 mg/dL (ref 0.76–1.27)
Calcium: 9.1 mg/dL (ref 8.6–10.2)
Chloride: 96 mmol/L — ABNORMAL LOW (ref 97–108)
GFR calc Af Amer: 107 mL/min/{1.73_m2} (ref >59)
GFR, EST NON AFRICAN AMERICAN: 92 mL/min/{1.73_m2} (ref >59)
Globulin, Total: 3.1 g/dL (ref 1.5–4.5)
Glucose: 139 mg/dL — ABNORMAL HIGH (ref 65–99)
POTASSIUM: 4.5 mmol/L (ref 3.5–5.2)
Sodium: 138 mmol/L (ref 134–144)
Total Protein: 7.3 g/dL (ref 6.0–8.5)

## 2015-07-16 MED ORDER — CANAGLIFLOZIN 300 MG PO TABS: 300.0000 mg | ORAL_TABLET | Freq: Every day | ORAL | Status: DC

## 2015-07-16 NOTE — Telephone Encounter (Signed)
-----   Message from Sharion Balloon, [place] sent at 07/16/2015  2:16 PM EDT ----- HgbA1C slightly elevated- Invokana increased to 300 mg- Prescription sent to pharmacy  Kidney and liver function stable LDL WNL- Triglycerides elevated- Pt needs to be on low fat diet

## 2015-07-17 NOTE — Progress Notes (Signed)
Patient aware.

## 2015-09-21 NOTE — Progress Notes (Signed)
Subjective:  Patient ID: Austin Ortiz, male    DOB: Nov 05, 1951  Age: 64 y.o. MRN: [id number]  CC: Ankle Pain   HPI Austin Ortiz presents for right Ankle pain suffered when unloading a truck 2 weeks ago. It was just beginning to get better when he went back to unloading the truck on another occasion 4 days ago. He has not changed any other activities. He has been hobbling somewhat. He remains active. 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 After the second injury he experienced an increase in pain. A decrease in activity. He slid off of a ramp he was using for unloading the truck 3 days ago. Turning the right ankle. Pain is posterior and medial just behind the medial malleolus. There is full range of motion distally. He has not noted swelling. He has been applying ice. However he continues to ambulate regularly having been to the beach over the last few days since the injury.   Also due for follow-up of hypertension. Patient has no history of headache chest pain or shortness of breath or recent cough. Patient also denies symptoms of TIA such as numbness weakness lateralizing. Patient checks  blood pressure at home and has not had any elevated readings recently. Patient denies side effects from his medication. States taking it regularly.     History Quadry has a past medical history of Diabetes mellitus; Kidney stones; Foreign body in subcutaneous tissue; Esophageal stricture; Obstructive sleep apnea (05/28/2013); Hypertension; and Hyperlipidemia.   He has past surgical history that includes neg hx.   His family history includes Breast cancer in his maternal aunt; Diabetes in his cousin and maternal aunt; Diabetes type II in his paternal aunt; Heart attack in his father; Heart disease in his mother; Hypertension in his mother; Nephrolithiasis in his maternal aunt. There is no history of Colon cancer.He reports that he has never smoked. He has never used smokeless  tobacco. He reports that he does not drink alcohol or use illicit drugs.  Current Outpatient Prescriptions on File Prior to Visit  Medication Sig Dispense Refill  . aspirin 81 MG tablet Take 81 mg by mouth daily.    . canagliflozin (INVOKANA) 300 MG TABS tablet Take 300 mg by mouth daily before breakfast. 90 tablet 2  . glimepiride (AMARYL) 4 MG tablet TAKE ONE TABLET BY MOUTH ONCE DAILY BEFORE BREAKFAST NEEDS  APPOINTMENT 90 tablet 3  . Glucosamine-Chondroitin-Vit D3 1500-1200-800 MG-MG-UNIT PACK Take 2 capsules by mouth.     [name] lisinopril-hydrochlorothiazide (PRINZIDE,ZESTORETIC) 20-12.5 MG per tablet Take 1 tablet by mouth daily. 90 tablet 3  . meloxicam (MOBIC) 15 MG tablet TAKE ONE TABLET BY MOUTH ONCE DAILY 30 tablet 4  . metFORMIN (GLUCOPHAGE) 1000 MG tablet Take 1 tablet (1,000 mg total) by mouth daily with breakfast. 90 tablet 3  . Multiple Vitamin (MULTIVITAMIN) tablet Take 1 tablet by mouth daily.    . niacin (NIASPAN) 1000 MG CR tablet TAKE ONE TABLET BY MOUTH ONCE DAILY AT BEDTIME. NEEDS  APPOINTMENT  FOR  LABS 90 tablet 3  . Omega-3 Fatty Acids (FISH OIL) 600 MG CAPS Take 3,000 mg by mouth daily.     [name] omeprazole (PRILOSEC) 40 MG capsule Take 1 capsule (40 mg total) by mouth at bedtime. 90 capsule 3  . simvastatin (ZOCOR) 40 MG tablet Take 1 tablet (40 mg total) by mouth every evening. 90 tablet 3  . sitaGLIPtin (JANUVIA) 50 MG tablet Take 1 tablet (50 mg total) by  mouth daily. 90 tablet 3   No current facility-administered medications on file prior to visit.    ROS Review of Systems  Constitutional: Negative for fever, chills and diaphoresis.  HENT: Negative for congestion, rhinorrhea and sore throat.   Respiratory: Negative for cough, shortness of breath and wheezing.   Cardiovascular: Negative for chest pain.  Gastrointestinal: Negative for nausea, vomiting, abdominal pain, diarrhea, constipation and abdominal distention.  Genitourinary: Negative for dysuria and frequency.   Musculoskeletal: Negative for joint swelling and arthralgias.  Skin: Negative for rash.  Neurological: Negative for headaches.    Objective:  BP 132/75 mmHg  Pulse 76  Temp(Src) 97.6 F (36.4 C) (Oral)  Ht 5\' 5"  (1.651 m)  Wt 203 lb 9.6 oz (92.352 kg)  BMI 33.88 kg/m2  BP Readings from Last 3 Encounters:  09/22/15 132/75  07/15/15 108/70  04/02/15 130/79    Wt Readings from Last 3 Encounters:  09/22/15 203 lb 9.6 oz (92.352 kg)  07/15/15 198 lb 9.6 oz (90.084 kg)  04/02/15 200 lb 3.2 oz (90.81 kg)     Physical Exam  Constitutional: He is oriented to person, place, and time. He appears well-developed and well-nourished. No distress.  HENT:  Head: Normocephalic and atraumatic.  Right Ear: External ear normal.  Left Ear: External ear normal.  Nose: Nose normal.  Mouth/Throat: Oropharynx is clear and moist.  Eyes: Conjunctivae and EOM are normal. Pupils are equal, round, and reactive to light.  Neck: Normal range of motion. Neck supple. No thyromegaly present.  Cardiovascular: Normal rate, regular rhythm and normal heart sounds.   No murmur heard. Pulmonary/Chest: Effort normal and breath sounds normal. No respiratory distress. He has no wheezes. He has no rales.  Abdominal: Soft. Bowel sounds are normal. He exhibits no distension. There is no tenderness.  Lymphadenopathy:    He has no cervical adenopathy.  Neurological: He is alert and oriented to person, place, and time. He has normal reflexes.  Skin: Skin is warm and dry.  Psychiatric: He has a normal mood and affect. His behavior is normal. Judgment and thought content normal.    Lab Results  Component Value Date   HGBA1C 7.3 07/15/2015   HGBA1C 7.9 04/02/2015   HGBA1C 7.2 09/15/2014    Lab Results  Component Value Date   GLUCOSE 139* 07/15/2015   CHOL 117 07/15/2015   TRIG 156* 07/15/2015   HDL 38* 07/15/2015   LDLCALC 48 07/15/2015   ALT 23 07/15/2015   AST 20 07/15/2015   NA 138 07/15/2015   K 4.5  07/15/2015   CL 96* 07/15/2015   CREATININE 0.87 07/15/2015   BUN 26 07/15/2015   CO2 24 07/15/2015   PSA 0.5 09/15/2014   HGBA1C 7.3 07/15/2015    No results found.  Assessment & Plan:   Dhilan was seen today for ankle pain.  Diagnoses and all orders for this visit:  Essential hypertension, benign  Right ankle pain -     DG Ankle Complete Right -     DME Other see comment  Other orders -     traMADol (ULTRAM) 50 MG tablet; Take 1 tablet (50 mg total) by mouth 4 (four) times daily as needed for moderate pain.   I am having Mr. Sauceda start on traMADol. I am also having him maintain his Fish Oil, Glucosamine-Chondroitin-Vit D3, multivitamin, aspirin, sitaGLIPtin, simvastatin, omeprazole, metFORMIN, lisinopril-hydrochlorothiazide, glimepiride, niacin, meloxicam, and canagliflozin.  Meds ordered this encounter  Medications  . traMADol (ULTRAM) 50 MG tablet  Sig: Take 1 tablet (50 mg total) by mouth 4 (four) times daily as needed for moderate pain.    Dispense:  60 tablet    Refill:  02   we discussed exercise for the ankle. He is to wear an ASO brace. This was ordered for him as well. If improvement is not sufficient within 1 week he is to contact the office and physical therapy will be arranged. Follow-up: Return in about 3 months (around 12/22/2015) for diabetes.  Claretta Fraise, M.D.

## 2015-09-22 ENCOUNTER — Ambulatory Visit (INDEPENDENT_AMBULATORY_CARE_PROVIDER_SITE_OTHER): Payer: BC Managed Care – PPO | Admitting: Family Medicine

## 2015-09-22 ENCOUNTER — Encounter: Payer: Self-pay | Admitting: Family Medicine

## 2015-09-22 ENCOUNTER — Ambulatory Visit (INDEPENDENT_AMBULATORY_CARE_PROVIDER_SITE_OTHER): Payer: BC Managed Care – PPO

## 2015-09-22 DIAGNOSIS — M25571 Pain in right ankle and joints of right foot: Secondary | ICD-10-CM

## 2015-09-22 DIAGNOSIS — I10 Essential (primary) hypertension: Secondary | ICD-10-CM

## 2015-09-22 MED ORDER — TRAMADOL HCL 50 MG PO TABS: 50.0000 mg | ORAL_TABLET | Freq: Four times a day (QID) | ORAL | Status: DC | PRN

## 2015-09-22 NOTE — Patient Instructions (Signed)
Ankle Exercises for Rehabilitation Following ankle injuries, it is as important to follow your caregiver's instructions for regaining full use of your ankle as it was to follow the initial treatment plan following the injury. The following are some suggestions for exercises and treatment, which can be done to help you regain full use of your ankle as soon as possible.  Follow all instructions regarding physical therapy.  Before exercising, it may be helpful to use heat on the muscles or joint being exercised. This loosens up the muscles and tendons (cordlike structure) and decreases chances of injury during your exercises. If this is not possible, just begin your exercises slowly to gradually warm up.  Stand on your toes several times per day to strengthen the calf muscles. These are the muscles in the back of your leg between the knee and the heel. The cord you can feel just above the heel is the Achilles tendon. Rise up on your toes several times repeating this three to four times per day. Do not exercise to the point of pain. If pain starts to develop, decrease the exercise until you are comfortable again.  Do range of motion exercises. This means moving the ankle in all directions. Practice writing the alphabet with your toes in the air. Do not increase beyond a range that is comfortable.  Increase the strength of the muscles in the front of your leg by raising your toes and foot straight up in the air. Repeat this exercise as you did the calf exercise with the same warnings. This also help to stretch your muscles.  Stretch your calf muscles also by leaning against a wall with your hands in front of you. Put your feet a few feet from the wall and bend your knees until you feel the muscles in your calves become tight.  After exercising it may be helpful to put ice on the ankle to prevent swelling and improve rehabilitation. This may be done for 15 to 20 minutes following your exercises. If  exercising is being done in the workplace, this may not always be possible.  Taping an ankle injury may be helpful to give added support following an injury. It also may help prevent reinjury. This may be true if you are in training or in a conditioning program. You and your caregiver can decide on the best course of action to follow. Document Released: 12/09/2000 Document Revised: 04/28/2014 Document Reviewed: 12/06/2008 [employer] Patient Information 2015 [employer]. This information is not intended to replace advice given to you by your health care Austin Ortiz. Make sure you discuss any questions you have with your health care Austin Ortiz.  

## 2015-10-07 ENCOUNTER — Telehealth: Payer: Self-pay

## 2015-10-07 NOTE — Telephone Encounter (Signed)
Insurance prior authorized Omeprazole through 10/05/16

## 2015-10-15 ENCOUNTER — Encounter: Payer: Self-pay | Admitting: Family Medicine

## 2015-10-15 ENCOUNTER — Ambulatory Visit (INDEPENDENT_AMBULATORY_CARE_PROVIDER_SITE_OTHER): Payer: BC Managed Care – PPO | Admitting: Family Medicine

## 2015-10-15 VITALS — BP 132/82 | HR 70 | Temp 98.0°F | Ht 65.0 in | Wt 204.2 lb

## 2015-10-15 DIAGNOSIS — Z0000 Encounter for general adult medical examination without abnormal findings: Secondary | ICD-10-CM

## 2015-10-15 DIAGNOSIS — IMO0001 Reserved for inherently not codable concepts without codable children: Secondary | ICD-10-CM

## 2015-10-15 DIAGNOSIS — E1165 Type 2 diabetes mellitus with hyperglycemia: Secondary | ICD-10-CM | POA: Diagnosis not present

## 2015-10-15 DIAGNOSIS — E785 Hyperlipidemia, unspecified: Secondary | ICD-10-CM | POA: Diagnosis not present

## 2015-10-15 DIAGNOSIS — I10 Essential (primary) hypertension: Secondary | ICD-10-CM

## 2015-10-15 LAB — POCT GLYCOSYLATED HEMOGLOBIN (HGB A1C): Hemoglobin A1C: 6.9

## 2015-10-15 NOTE — Progress Notes (Signed)
   HPI  Patient presents today for follow-up for diabetes, hypertension, hyperlipidemia.  Diabetes Fasting blood sugars usually run 150s Goodman compliance No diabetic neuropathy or polyuria Insurance will stop paying for invokana in 2 months. Watching diet  Hypertension Blood pressure at home runs in the 130s Good medical compliance No chest pain, dyspnea, palpitations, leg edema, orthopnea  Hyperlipidemia Get noncompliance also watching diet   PMH: Smoking status noted ROS: Per HPI  Objective: BP 132/82 mmHg  Pulse 70  Temp(Src) 98 F (36.7 C) (Oral)  Ht 5\' 5"  (1.651 m)  Wt 204 lb 3.2 oz (92.625 kg)  BMI 33.98 kg/m2 Gen: NAD, alert, cooperative with exam HEENT: NCAT CV: RRR, good S1/S2, no murmur Resp: Nonlabored, and crackles at the very low bases, otherwise clear Ext: No edema, warm Neuro: Alert and oriented, No gross deficits  Assessment and plan:  # Diabetes Well-controlled, congratulated Continue current meds, metformin, glimepiride, invokana Look for alternative to invokana, will ask clinical pharmacist for help  # Hypertension Well-controlled No change in meds  # Healthcare maintenance Flu shot given from an outside source   Orders Placed This Encounter  Procedures  . POCT glycosylated hemoglobin (Hb A1C)    No orders of the defined types were placed in this encounter.    Laroy Apple, MD [facility] 10/15/2015, 8:49 AM

## 2015-10-15 NOTE — Patient Instructions (Signed)
Great job, keep up the good [facility]!  Your A1C is 6.9  Come back in 3 months  We will look into which med [employer] would prefer instead of invokana

## 2015-10-20 ENCOUNTER — Other Ambulatory Visit: Payer: Self-pay | Admitting: Family Medicine

## 2015-10-20 MED ORDER — EMPAGLIFLOZIN 25 MG PO TABS: 25.0000 mg | ORAL_TABLET | Freq: Every day | ORAL | Status: DC

## 2015-10-20 NOTE — Telephone Encounter (Signed)
I have changed from invokana to jardiance for formulary preference reasons.   If this is not covered well the other option is farxiga or xigduo which could replace his metformin as well. I have left a vm stating i wanted to discuss but would leave details for nursing to explain.   Laroy Apple, MD [facility] 10/20/2015, 2:08 PM

## 2015-10-21 ENCOUNTER — Telehealth: Payer: Self-pay | Admitting: Family Medicine

## 2015-10-21 NOTE — Telephone Encounter (Signed)
Stp advised we will be changing his invokana to Jardiance starting in January per Dr Wendi Snipes comment on rx in system. Pt voiced understanding.

## 2015-11-24 ENCOUNTER — Other Ambulatory Visit: Payer: Self-pay | Admitting: Family

## 2015-12-22 ENCOUNTER — Other Ambulatory Visit: Payer: Self-pay | Admitting: Family Medicine

## 2016-01-01 ENCOUNTER — Encounter: Payer: Self-pay | Admitting: Internal Medicine

## 2016-01-15 ENCOUNTER — Other Ambulatory Visit: Payer: Self-pay | Admitting: Family Medicine

## 2016-01-18 ENCOUNTER — Encounter: Payer: Self-pay | Admitting: Family Medicine

## 2016-01-18 ENCOUNTER — Ambulatory Visit (INDEPENDENT_AMBULATORY_CARE_PROVIDER_SITE_OTHER): Payer: BC Managed Care – PPO | Admitting: Family Medicine

## 2016-01-18 VITALS — BP 119/68 | HR 74 | Temp 97.6°F | Ht 65.0 in | Wt 203.2 lb

## 2016-01-18 DIAGNOSIS — E785 Hyperlipidemia, unspecified: Secondary | ICD-10-CM | POA: Diagnosis not present

## 2016-01-18 DIAGNOSIS — I10 Essential (primary) hypertension: Secondary | ICD-10-CM

## 2016-01-18 DIAGNOSIS — Z0000 Encounter for general adult medical examination without abnormal findings: Secondary | ICD-10-CM

## 2016-01-18 DIAGNOSIS — IMO0001 Reserved for inherently not codable concepts without codable children: Secondary | ICD-10-CM

## 2016-01-18 DIAGNOSIS — E1165 Type 2 diabetes mellitus with hyperglycemia: Secondary | ICD-10-CM | POA: Diagnosis not present

## 2016-01-18 DIAGNOSIS — K2100 Gastro-esophageal reflux disease with esophagitis, without bleeding: Secondary | ICD-10-CM

## 2016-01-18 LAB — POCT GLYCOSYLATED HEMOGLOBIN (HGB A1C): HEMOGLOBIN A1C: 7.3

## 2016-01-18 MED ORDER — SITAGLIPTIN PHOSPHATE 50 MG PO TABS: 50.0000 mg | ORAL_TABLET | Freq: Every day | ORAL | Status: DC

## 2016-01-18 MED ORDER — MELOXICAM 7.5 MG PO TABS: 7.5000 mg | ORAL_TABLET | Freq: Every day | ORAL | Status: DC

## 2016-01-18 MED ORDER — OMEPRAZOLE 40 MG PO CPDR: 40.0000 mg | DELAYED_RELEASE_CAPSULE | Freq: Every day | ORAL | Status: DC

## 2016-01-18 MED ORDER — LISINOPRIL-HYDROCHLOROTHIAZIDE 20-12.5 MG PO TABS: 1.0000 | ORAL_TABLET | Freq: Every day | ORAL | Status: DC

## 2016-01-18 MED ORDER — NIACIN ER (ANTIHYPERLIPIDEMIC) 1000 MG PO TBCR: EXTENDED_RELEASE_TABLET | ORAL | Status: DC

## 2016-01-18 MED ORDER — GLIMEPIRIDE 4 MG PO TABS: ORAL_TABLET | ORAL | Status: DC

## 2016-01-18 MED ORDER — SIMVASTATIN 40 MG PO TABS: 40.0000 mg | ORAL_TABLET | Freq: Every evening | ORAL | Status: DC

## 2016-01-18 MED ORDER — INVOKANA 300 MG PO TABS
300.0000 mg | ORAL_TABLET | Freq: Every day | ORAL | Status: DC
Start: 2016-01-18 — End: 2017-02-20

## 2016-01-18 MED ORDER — METFORMIN HCL 1000 MG PO TABS: 1000.0000 mg | ORAL_TABLET | Freq: Every day | ORAL | Status: DC

## 2016-01-18 NOTE — Progress Notes (Signed)
HPI  Patient presents today . Discussed diabetes, hypertension, arthritis.  Diabetes Good medication compliance Does not check his blood sugar so much currently. Watching his diet intermittently He is generally active but no formal exercise.  Hypertension Good medication compliance No chest pain, dyspnea, leg edema.  Arthritis Helped quite a bit 5 meloxicam 15 mg, he states that before this he had severe nighttime pain. He's okay with changing the dose to try to minimize his exposure   PMH: Smoking status noted ROS: Per HPI  Objective: BP 119/68 mmHg  Pulse 74  Temp(Src) 97.6 F (36.4 C) (Oral)  Ht 5' 5" (1.651 m)  Wt 203 lb 3.2 oz (92.171 kg)  BMI 33.81 kg/m2 Gen: NAD, alert, cooperative with exam HEENT: NCAT, TMs normal bilaterally, moist membranes CV: RRR, good S1/S2, no murmur Resp: CTABL, no wheezes, non-labored Abd: SNTND, BS present, no guarding or organomegaly Ext: No edema, warm Neuro: Alert and oriented, No gross deficits  Assessment and plan:  # Diabetes Control slipping slightly, still only barely above goal of 7.0 Continue glimepiride, Januvia, invokana, and metformin. His formulary has had a change in his invokana but for now he is continueing with a coupon, jiardiance is the alternative F/u 3 months Improve diet control  # HTN Controlled No med changes Labs  # HLD Labs, continue simva and niacin, niacin is probably not neccessary  # HCM FOBT card given   Orders Placed This Encounter  Procedures  . CMP14+EGFR  . CBC    Standing Status: Future     Number of Occurrences:      Standing Expiration Date: 01/17/2017  . Lipid Panel  . POCT glycosylated hemoglobin (Hb A1C)    Meds ordered this encounter  Medications  . DISCONTD: INVOKANA 300 MG TABS tablet    Sig:   . metFORMIN (GLUCOPHAGE) 1000 MG tablet    Sig: Take 1 tablet (1,000 mg total) by mouth daily with breakfast.    Dispense:  90 tablet    Refill:  3  . sitaGLIPtin  (JANUVIA) 50 MG tablet    Sig: Take 1 tablet (50 mg total) by mouth daily.    Dispense:  90 tablet    Refill:  3  . glimepiride (AMARYL) 4 MG tablet    Sig: TAKE ONE TABLET BY MOUTH ONCE DAILY BEFORE BREAKFAST NEEDS  APPOINTMENT    Dispense:  90 tablet    Refill:  3  . lisinopril-hydrochlorothiazide (PRINZIDE,ZESTORETIC) 20-12.5 MG tablet    Sig: Take 1 tablet by mouth daily.    Dispense:  90 tablet    Refill:  3  . omeprazole (PRILOSEC) 40 MG capsule    Sig: Take 1 capsule (40 mg total) by mouth at bedtime.    Dispense:  90 capsule    Refill:  3  . simvastatin (ZOCOR) 40 MG tablet    Sig: Take 1 tablet (40 mg total) by mouth every evening.    Dispense:  90 tablet    Refill:  3  . niacin (NIASPAN) 1000 MG CR tablet    Sig: TAKE ONE TABLET BY MOUTH ONCE DAILY AT BEDTIME. NEEDS  APPOINTMENT  FOR  LABS    Dispense:  90 tablet    Refill:  3  . INVOKANA 300 MG TABS tablet    Sig: Take 300 mg by mouth daily before breakfast.    Dispense:  90 tablet    Refill:  3  . meloxicam (MOBIC) 7.5 MG tablet    Sig: Take  1 tablet (7.5 mg total) by mouth daily.    Dispense:  30 tablet    Refill:  [place], MD [facility] 01/18/2016, 10:34 AM

## 2016-01-18 NOTE — Patient Instructions (Signed)
Great to see you!  I have sent a new prescription for meloxicam at 7.5 mg, If you need to go back to 15 let me know  I have also sent invokana since you have the coupon  Lets see you back in 3 months  Your control has slipped slightly, keep active and try to watch your diet a little closer (I know the holidays are tough)

## 2016-01-19 LAB — CMP14+EGFR
A/G RATIO: 1.5 (ref 1.1–2.5)
ALT: 25 IU/L (ref 0–44)
AST: 21 IU/L (ref 0–40)
Albumin: 4.6 g/dL (ref 3.6–4.8)
Alkaline Phosphatase: 41 IU/L (ref 39–117)
BILIRUBIN TOTAL: 0.4 mg/dL (ref 0.0–1.2)
BUN/Creatinine Ratio: 22 (ref 10–22)
BUN: 23 mg/dL (ref 8–27)
CALCIUM: 9.3 mg/dL (ref 8.6–10.2)
CHLORIDE: 97 mmol/L (ref 96–106)
CO2: 23 mmol/L (ref 18–29)
Creatinine, Ser: 1.05 mg/dL (ref 0.76–1.27)
GFR, EST AFRICAN AMERICAN: 87 mL/min/{1.73_m2} (ref >59)
GFR, EST NON AFRICAN AMERICAN: 75 mL/min/{1.73_m2} (ref >59)
GLOBULIN, TOTAL: 3 g/dL (ref 1.5–4.5)
Glucose: 135 mg/dL — ABNORMAL HIGH (ref 65–99)
POTASSIUM: 4.5 mmol/L (ref 3.5–5.2)
SODIUM: 138 mmol/L (ref 134–144)
TOTAL PROTEIN: 7.6 g/dL (ref 6.0–8.5)

## 2016-01-19 LAB — LIPID PANEL
CHOL/HDL RATIO: 2.7 ratio (ref 0.0–5.0)
Cholesterol, Total: 112 mg/dL (ref 100–199)
HDL: 41 mg/dL (ref >39)
LDL Calculated: 39 mg/dL (ref 0–99)
Triglycerides: 161 mg/dL — ABNORMAL HIGH (ref 0–149)
VLDL Cholesterol Cal: 32 mg/dL (ref 5–40)

## 2016-04-19 ENCOUNTER — Ambulatory Visit (INDEPENDENT_AMBULATORY_CARE_PROVIDER_SITE_OTHER): Payer: BC Managed Care – PPO | Admitting: Family Medicine

## 2016-04-19 ENCOUNTER — Encounter: Payer: Self-pay | Admitting: Family Medicine

## 2016-04-19 VITALS — BP 113/73 | HR 72 | Temp 97.1°F | Ht 65.0 in | Wt 200.8 lb

## 2016-04-19 DIAGNOSIS — IMO0001 Reserved for inherently not codable concepts without codable children: Secondary | ICD-10-CM

## 2016-04-19 DIAGNOSIS — I10 Essential (primary) hypertension: Secondary | ICD-10-CM

## 2016-04-19 DIAGNOSIS — E1165 Type 2 diabetes mellitus with hyperglycemia: Secondary | ICD-10-CM | POA: Diagnosis not present

## 2016-04-19 DIAGNOSIS — M25561 Pain in right knee: Secondary | ICD-10-CM

## 2016-04-19 DIAGNOSIS — M25562 Pain in left knee: Secondary | ICD-10-CM

## 2016-04-19 LAB — BAYER DCA HB A1C WAIVED: HB A1C: 7.1 % — AB (ref <7.0)

## 2016-04-19 MED ORDER — MELOXICAM 15 MG PO TABS: 15.0000 mg | ORAL_TABLET | Freq: Every day | ORAL | Status: DC

## 2016-04-19 NOTE — Addendum Note (Signed)
Addended by: Timmothy Euler on: 04/19/2016 08:41 AM   Modules accepted: Miquel Dunn

## 2016-04-19 NOTE — Progress Notes (Signed)
   HPI  Patient presents today here for follow-up diabetes, hypertension, knee pain.  Knee pain Worsened with decreasing meloxicam. He would like to increase meloxicam back to 15 mg.  Hypertension Good medication compliance No chest pain, dyspnea, palpitations, leg edema.  Type 2 diabetes Does not check blood sugars regularly, does not know any fasting blood sugars. Watching diet moderately. Good medication compliance of no hypoglycemia.  PMH: Smoking status noted ROS: Per HPI  Objective: BP 113/73 mmHg  Pulse 72  Temp(Src) 97.1 F (36.2 C) (Oral)  Ht 5\' 5"  (1.651 m)  Wt 200 lb 12.8 oz (91.082 kg)  BMI 33.41 kg/m2 Gen: NAD, alert, cooperative with exam HEENT: NCAT CV: RRR, good S1/S2, no murmur Resp: CTABL, no wheezes, non-labored Ext: No edema, warm Neuro: Alert and oriented, No gross deficits  Assessment and plan:  # Type 2 diabetes A1c pending. Several medications including Januvia, Invokana, metformin, and Amaryl No hypoglycemia Consider weekly GLP if uncontrolled, discussed diet and exercise, if steady or improving for changes for now  # Hypertension Very well controlled Continue Prinzide  # Bilateral knee pain, osteoarthritis Requests increase in refill of meloxicam We discussed side effects and benefits, he understands these. He's been seen by orthopedics who diagnosed osteoarthritis He will call in for prescription request Month for either 7.5 or 15 mg, attempting to reduce NSAID burden   Meds ordered this encounter  Medications  . meloxicam (MOBIC) 15 MG tablet    Sig: Take 1 tablet (15 mg total) by mouth daily.    Dispense:  30 tablet    Refill:  0    Laroy Apple, MD [facility] 04/19/2016, 8:31 AM

## 2016-04-19 NOTE — Patient Instructions (Signed)
Great to see you!  I have sent meloxicam to the pharmacy  We will call with you lab results and any recommendations on medication changes

## 2016-04-19 NOTE — Addendum Note (Signed)
Addended by: Timmothy Euler on: 04/19/2016 08:42 AM   Modules accepted: Orders, SmartSet

## 2016-04-20 LAB — MICROALBUMIN / CREATININE URINE RATIO
CREATININE, UR: 60.1 mg/dL
MICROALB/CREAT RATIO: <5 mg/g creat (ref 0.0–30.0)
Microalbumin, Urine: <3 ug/mL

## 2016-04-25 ENCOUNTER — Telehealth: Payer: Self-pay | Admitting: Family Medicine

## 2016-05-26 ENCOUNTER — Other Ambulatory Visit: Payer: Self-pay | Admitting: Family Medicine

## 2016-07-06 ENCOUNTER — Other Ambulatory Visit: Payer: Self-pay | Admitting: Family Medicine

## 2016-07-19 ENCOUNTER — Encounter: Payer: Self-pay | Admitting: Family Medicine

## 2016-07-19 ENCOUNTER — Ambulatory Visit (INDEPENDENT_AMBULATORY_CARE_PROVIDER_SITE_OTHER): Payer: BC Managed Care – PPO | Admitting: Family Medicine

## 2016-07-19 VITALS — BP 114/62 | HR 63 | Temp 96.9°F

## 2016-07-19 DIAGNOSIS — I10 Essential (primary) hypertension: Secondary | ICD-10-CM

## 2016-07-19 DIAGNOSIS — E1165 Type 2 diabetes mellitus with hyperglycemia: Secondary | ICD-10-CM | POA: Diagnosis not present

## 2016-07-19 DIAGNOSIS — M25562 Pain in left knee: Secondary | ICD-10-CM

## 2016-07-19 DIAGNOSIS — IMO0001 Reserved for inherently not codable concepts without codable children: Secondary | ICD-10-CM

## 2016-07-19 LAB — BAYER DCA HB A1C WAIVED: HB A1C (BAYER DCA - WAIVED): 7.2 % — ABNORMAL HIGH (ref <7.0)

## 2016-07-19 MED ORDER — SITAGLIPTIN PHOS-METFORMIN HCL 50-1000 MG PO TABS: 1.0000 | ORAL_TABLET | Freq: Two times a day (BID) | ORAL | 11 refills | Status: DC

## 2016-07-19 NOTE — Patient Instructions (Addendum)
Great to see you!  For your knee try wearning a knee sleeve during work, you may also want to consider using ice for 15 minutes after working   Diabetes: I have changed from [place] and metformin separately to [place], 1 pill twice a day will replace both of these medicines. This will be a slight increase in your Januvia dose but I think he will tolerate it very well.

## 2016-07-19 NOTE — Progress Notes (Signed)
   HPI  Patient presents today with left knee pain, and to follow-up for diabetes and hypertension.  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  Described as left medial pain and a stripe from above his knee down to below his knee. Aggravating factors are standing or working. Alleviating factors.  Diabetes Not checking blood sugars for a few weeks with being busy with a harvest. Good medication compliance No hypoglycemia.  Hypertension Good medication compliance, no chest pain, dyspnea, palpitations, leg edema  PMH: Smoking status noted ROS: Per HPI  Objective: BP 114/62   Pulse 63   Temp (!) 96.9 F (36.1 C) (Oral)  Gen: NAD, alert, cooperative with exam HEENT: NCAT CV: RRR, good S1/S2, no murmur Resp: CTABL, no wheezes, non-laboredy Ext: No edema, warm Neuro: Alert and oriented, No gross deficits  MSK: L knee without erythema, effusion, bruising, or gross deformity No joint line tenderness.  ligamentously intact to Lachman's and with varus and valgus stress.  Negative McMurray's test   Assessment and plan:  # Type 2 diabetes A1c pretty much stable, 7.2 He's been borderline controlled for 6 months now Increasing Januvia to the milligrams twice daily in the form of Janumet Continue other current medications including an invokana and glimepride  # HTN Well controlled on Prinzide, continue Labs  # Knee pain Likely osteoarthritis and overuse Recommended supportive care including the compression while working, ice, and continuing NSAIDs. We tried backing off of meloxicam but he had very good benefit from this.     Orders Placed This Encounter  Procedures  . Bayer DCA Hb A1c Waived    Meds ordered this encounter  Medications  . sitaGLIPtin-metformin (JANUMET) 50-1000 MG tablet    Sig: Take 1 tablet by mouth 2 (two) times daily with a meal.    Dispense:  60 tablet    Refill:  Bryan, MD [facility] 07/19/2016, 9:02 AM

## 2016-07-20 LAB — CMP14+EGFR
A/G RATIO: 1.7 (ref 1.2–2.2)
ALK PHOS: 39 IU/L (ref 39–117)
ALT: 21 IU/L (ref 0–44)
AST: 19 IU/L (ref 0–40)
Albumin: 4.5 g/dL (ref 3.6–4.8)
BILIRUBIN TOTAL: 0.3 mg/dL (ref 0.0–1.2)
BUN / CREAT RATIO: 26 — AB (ref 10–24)
BUN: 22 mg/dL (ref 8–27)
CHLORIDE: 99 mmol/L (ref 96–106)
CO2: 24 mmol/L (ref 18–29)
CREATININE: 0.86 mg/dL (ref 0.76–1.27)
Calcium: 9 mg/dL (ref 8.6–10.2)
GFR calc Af Amer: 107 mL/min/{1.73_m2} (ref >59)
GFR calc non Af Amer: 92 mL/min/{1.73_m2} (ref >59)
GLOBULIN, TOTAL: 2.6 g/dL (ref 1.5–4.5)
Glucose: 157 mg/dL — ABNORMAL HIGH (ref 65–99)
POTASSIUM: 4.6 mmol/L (ref 3.5–5.2)
SODIUM: 137 mmol/L (ref 134–144)
Total Protein: 7.1 g/dL (ref 6.0–8.5)

## 2016-07-20 LAB — CBC WITH DIFFERENTIAL/PLATELET
BASOS ABS: 0 10*3/uL (ref 0.0–0.2)
Basos: 0 %
EOS (ABSOLUTE): 0.3 10*3/uL (ref 0.0–0.4)
Eos: 4 %
HEMATOCRIT: 45 % (ref 37.5–51.0)
Hemoglobin: 14.7 g/dL (ref 12.6–17.7)
IMMATURE GRANULOCYTES: 0 %
Immature Grans (Abs): 0 10*3/uL (ref 0.0–0.1)
LYMPHS ABS: 2.8 10*3/uL (ref 0.7–3.1)
Lymphs: 36 %
MCH: 30 pg (ref 26.6–33.0)
MCHC: 32.7 g/dL (ref 31.5–35.7)
MCV: 92 fL (ref 79–97)
MONOS ABS: 0.7 10*3/uL (ref 0.1–0.9)
Monocytes: 9 %
NEUTROS PCT: 51 %
Neutrophils Absolute: 4 10*3/uL (ref 1.4–7.0)
PLATELETS: 229 10*3/uL (ref 150–379)
RBC: 4.9 x10E6/uL (ref 4.14–5.80)
RDW: 13.8 % (ref 12.3–15.4)
WBC: 7.8 10*3/uL (ref 3.4–10.8)

## 2016-08-05 ENCOUNTER — Other Ambulatory Visit: Payer: Self-pay | Admitting: Family Medicine

## 2016-08-29 ENCOUNTER — Other Ambulatory Visit: Payer: Self-pay | Admitting: Family Medicine

## 2016-10-10 ENCOUNTER — Other Ambulatory Visit: Payer: Self-pay | Admitting: Family Medicine

## 2016-10-20 ENCOUNTER — Encounter: Payer: Self-pay | Admitting: Family Medicine

## 2016-10-20 ENCOUNTER — Ambulatory Visit (INDEPENDENT_AMBULATORY_CARE_PROVIDER_SITE_OTHER): Payer: BC Managed Care – PPO | Admitting: Family Medicine

## 2016-10-20 VITALS — BP 125/77 | HR 71 | Temp 97.3°F | Ht 65.0 in | Wt 200.8 lb

## 2016-10-20 DIAGNOSIS — E1165 Type 2 diabetes mellitus with hyperglycemia: Secondary | ICD-10-CM | POA: Diagnosis not present

## 2016-10-20 DIAGNOSIS — E785 Hyperlipidemia, unspecified: Secondary | ICD-10-CM | POA: Diagnosis not present

## 2016-10-20 DIAGNOSIS — IMO0001 Reserved for inherently not codable concepts without codable children: Secondary | ICD-10-CM

## 2016-10-20 DIAGNOSIS — I10 Essential (primary) hypertension: Secondary | ICD-10-CM

## 2016-10-20 DIAGNOSIS — G4733 Obstructive sleep apnea (adult) (pediatric): Secondary | ICD-10-CM

## 2016-10-20 LAB — BAYER DCA HB A1C WAIVED: HB A1C: 6.9 % (ref <7.0)

## 2016-10-20 NOTE — Patient Instructions (Signed)
Great to see you!  Its ok to stop niacin, we will re-check your labs next visit in January

## 2016-10-20 NOTE — Progress Notes (Signed)
   HPI  Patient presents today here to follow-up for diabetes, hyperlipidemia, hypertension.  Diabetes Good medication compliance Occasional fasting blood sugar checks of 120-150 No hypoglycemia for 3 months No foot numbness Watching his diet moderately, occupationally active as a Psychologist, sport and exercise.  Hypertension Occasionally checks blood pressure, average is 130s over 70s No chest pain, dyspnea, palpitations, leg edema.  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 No daytime sleepiness or fatigue. Feels he sleeps. Well.  Hyperlipidemia Niacin is started giving him hot flashes.   PMH: Smoking status noted ROS: Per HPI  Objective: BP 125/77   Pulse 71   Temp 97.3 F (36.3 C) (Oral)   Ht 5\' 5"  (1.651 m)   Wt 200 lb 12.8 oz (91.1 kg)   BMI 33.41 kg/m  Gen: NAD, alert, cooperative with exam HEENT: NCAT CV: RRR, good S1/S2, no murmur Resp: CTABL, no wheezes, non-labored Ext: No edema, warm Neuro: Alert and oriented, No gross deficits  Diabetic Foot Exam - Simple   Simple Foot Form Visual Inspection No deformities, no ulcerations, no other skin breakdown bilaterally:  Yes Sensation Testing Intact to touch and monofilament testing bilaterally:  Yes Pulse Check Posterior Tibialis and Dorsalis pulse intact bilaterally:  Yes Comments      Assessment and plan:  # Type 2 diabetes Likely improving, A1c pending. Continue current medications, Janumet, Amaryl, invokana  # Hypertension Well-controlled on Prinzide Continue current treatment  # Hyperlipidemia Discontinue niacin Recheck labs in 3 months at follow-up  # Obstructive sleep apnea Largely untreated now, however his symptoms are mild. Follow-up as needed   Orders Placed This Encounter  Procedures  . Bayer [facility] Hb A1c Carney Bern, MD [facility] 10/20/2016, 8:39 AM

## 2016-11-14 ENCOUNTER — Other Ambulatory Visit: Payer: Self-pay | Admitting: Family Medicine

## 2017-01-16 ENCOUNTER — Other Ambulatory Visit: Payer: Self-pay | Admitting: Family Medicine

## 2017-01-20 ENCOUNTER — Ambulatory Visit (INDEPENDENT_AMBULATORY_CARE_PROVIDER_SITE_OTHER): Payer: BC Managed Care – PPO | Admitting: Family Medicine

## 2017-01-20 ENCOUNTER — Encounter: Payer: Self-pay | Admitting: Family Medicine

## 2017-01-20 VITALS — BP 114/72 | HR 77 | Temp 97.7°F | Ht 65.0 in | Wt 202.4 lb

## 2017-01-20 DIAGNOSIS — E785 Hyperlipidemia, unspecified: Secondary | ICD-10-CM

## 2017-01-20 DIAGNOSIS — IMO0001 Reserved for inherently not codable concepts without codable children: Secondary | ICD-10-CM

## 2017-01-20 DIAGNOSIS — I10 Essential (primary) hypertension: Secondary | ICD-10-CM

## 2017-01-20 DIAGNOSIS — E1165 Type 2 diabetes mellitus with hyperglycemia: Secondary | ICD-10-CM | POA: Diagnosis not present

## 2017-01-20 LAB — BAYER DCA HB A1C WAIVED: HB A1C (BAYER DCA - WAIVED): 7.8 % — ABNORMAL HIGH (ref <7.0)

## 2017-01-20 NOTE — Patient Instructions (Signed)
Great to see you!  We will call with labs within 1 week.   Lets plan on following up in 3 months.

## 2017-01-20 NOTE — Progress Notes (Signed)
   HPI  Patient presents today for diabetes, hypertension, and HLD  Diabetes seems to be going well. Good medication compliance and tolerance. Fasting blood sugars 120-150. Patient watching his diet moderately.  Hypertension Good medication compliance, no chest pain, dyspnea, palpitations, leg edema.  Hyperlipidemia Again good medication compliance No medication side effects.  PMH: Smoking status noted ROS: Per HPI  Objective: BP 114/72   Pulse 77   Temp 97.7 F (36.5 C) (Oral)   Ht '5\' 5"'$  (1.651 m)   Wt 202 lb 6.4 oz (91.8 kg)   BMI 33.68 kg/m  Gen: NAD, alert, cooperative with exam HEENT: NCAT CV: RRR, good S1/S2, no murmur Resp: CTABL, no wheezes, non-labored Abd: SNTND, BS present, no guarding or organomegaly Ext: No edema, warm Neuro: Alert and oriented, No gross deficits  Diabetic Foot Exam - Simple   Simple Foot Form Diabetic Foot exam was performed with the following findings:  Yes 01/20/2017  9:02 AM  Visual Inspection No deformities, no ulcerations, no other skin breakdown bilaterally:  Yes Sensation Testing Intact to touch and monofilament testing bilaterally:  Yes Pulse Check Posterior Tibialis and Dorsalis pulse intact bilaterally:  Yes Comments BL Heel calluses with no lesions or concerns for ulceration      Assessment and plan:  # T2 diabetes A1c improved last visit, I expect that he'll be stable this time, A1c pending Continue current medications, Janumet, Amaryl, invokana Discussed diabetic eye exam. Foot exam normal  # Hypertension Very well controlled on Prinzide, continue Labs repeated  # Hyperlipidemia Tolerating simvastatin well, suspect stable course Repeating labs    Orders Placed This Encounter  Procedures  . CMP14+EGFR  . CBC with Differential/Platelet  . Lipid panel  . POCT glycosylated hemoglobin (Hb A1C)     Laroy Apple, MD [facility] 01/20/2017, 9:05 AM

## 2017-01-21 LAB — CMP14+EGFR
ALT: 32 IU/L (ref 0–44)
AST: 21 IU/L (ref 0–40)
Albumin/Globulin Ratio: 1.3 (ref 1.2–2.2)
Albumin: 4.3 g/dL (ref 3.6–4.8)
Alkaline Phosphatase: 47 IU/L (ref 39–117)
BUN/Creatinine Ratio: 23 (ref 10–24)
BUN: 21 mg/dL (ref 8–27)
Bilirubin Total: 0.3 mg/dL (ref 0.0–1.2)
CALCIUM: 9.5 mg/dL (ref 8.6–10.2)
CO2: 25 mmol/L (ref 18–29)
CREATININE: 0.92 mg/dL (ref 0.76–1.27)
Chloride: 98 mmol/L (ref 96–106)
GFR calc Af Amer: 101 mL/min/{1.73_m2} (ref >59)
GFR, EST NON AFRICAN AMERICAN: 88 mL/min/{1.73_m2} (ref >59)
GLOBULIN, TOTAL: 3.3 g/dL (ref 1.5–4.5)
GLUCOSE: 144 mg/dL — AB (ref 65–99)
Potassium: 5.7 mmol/L — ABNORMAL HIGH (ref 3.5–5.2)
SODIUM: 140 mmol/L (ref 134–144)
Total Protein: 7.6 g/dL (ref 6.0–8.5)

## 2017-01-21 LAB — LIPID PANEL
CHOL/HDL RATIO: 3.1 ratio (ref 0.0–5.0)
Cholesterol, Total: 113 mg/dL (ref 100–199)
HDL: 36 mg/dL — ABNORMAL LOW (ref >39)
LDL Calculated: 41 mg/dL (ref 0–99)
TRIGLYCERIDES: 182 mg/dL — AB (ref 0–149)
VLDL CHOLESTEROL CAL: 36 mg/dL (ref 5–40)

## 2017-01-21 LAB — CBC WITH DIFFERENTIAL/PLATELET
BASOS: 0 %
Basophils Absolute: 0 10*3/uL (ref 0.0–0.2)
EOS (ABSOLUTE): 0.2 10*3/uL (ref 0.0–0.4)
EOS: 2 %
HEMATOCRIT: 44.9 % (ref 37.5–51.0)
HEMOGLOBIN: 14.8 g/dL (ref 13.0–17.7)
IMMATURE GRANS (ABS): 0 10*3/uL (ref 0.0–0.1)
IMMATURE GRANULOCYTES: 0 %
LYMPHS: 29 %
Lymphocytes Absolute: 2.6 10*3/uL (ref 0.7–3.1)
MCH: 29.9 pg (ref 26.6–33.0)
MCHC: 33 g/dL (ref 31.5–35.7)
MCV: 91 fL (ref 79–97)
MONOCYTES: 10 %
Monocytes Absolute: 0.9 10*3/uL (ref 0.1–0.9)
NEUTROS PCT: 59 %
Neutrophils Absolute: 5.3 10*3/uL (ref 1.4–7.0)
Platelets: 373 10*3/uL (ref 150–379)
RBC: 4.95 x10E6/uL (ref 4.14–5.80)
RDW: 13.3 % (ref 12.3–15.4)
WBC: 9.1 10*3/uL (ref 3.4–10.8)

## 2017-02-01 ENCOUNTER — Other Ambulatory Visit: Payer: Self-pay | Admitting: Family Medicine

## 2017-02-01 ENCOUNTER — Other Ambulatory Visit: Payer: BC Managed Care – PPO

## 2017-02-01 DIAGNOSIS — E875 Hyperkalemia: Secondary | ICD-10-CM

## 2017-02-01 DIAGNOSIS — K2100 Gastro-esophageal reflux disease with esophagitis, without bleeding: Secondary | ICD-10-CM

## 2017-02-02 LAB — BMP8+EGFR
BUN / CREAT RATIO: 20 (ref 10–24)
BUN: 17 mg/dL (ref 8–27)
CALCIUM: 9 mg/dL (ref 8.6–10.2)
CO2: 23 mmol/L (ref 18–29)
Chloride: 100 mmol/L (ref 96–106)
Creatinine, Ser: 0.84 mg/dL (ref 0.76–1.27)
GFR, EST AFRICAN AMERICAN: 107 mL/min/{1.73_m2} (ref >59)
GFR, EST NON AFRICAN AMERICAN: 93 mL/min/{1.73_m2} (ref >59)
Glucose: 158 mg/dL — ABNORMAL HIGH (ref 65–99)
Potassium: 4.5 mmol/L (ref 3.5–5.2)
Sodium: 139 mmol/L (ref 134–144)

## 2017-02-20 ENCOUNTER — Other Ambulatory Visit: Payer: Self-pay | Admitting: Family Medicine

## 2017-03-07 ENCOUNTER — Other Ambulatory Visit: Payer: Self-pay | Admitting: Family Medicine

## 2017-03-07 DIAGNOSIS — E785 Hyperlipidemia, unspecified: Secondary | ICD-10-CM

## 2017-03-10 ENCOUNTER — Other Ambulatory Visit: Payer: Self-pay | Admitting: Family Medicine

## 2017-03-27 ENCOUNTER — Other Ambulatory Visit: Payer: Self-pay | Admitting: Family Medicine

## 2017-03-29 ENCOUNTER — Other Ambulatory Visit: Payer: Self-pay | Admitting: Family Medicine

## 2017-03-31 ENCOUNTER — Telehealth: Payer: Self-pay | Admitting: Family Medicine

## 2017-04-04 ENCOUNTER — Telehealth: Payer: Self-pay

## 2017-04-04 MED ORDER — DAPAGLIFLOZIN PROPANEDIOL 10 MG PO TABS: 10.0000 mg | ORAL_TABLET | Freq: Every day | ORAL | 5 refills | Status: DC

## 2017-04-04 NOTE — Telephone Encounter (Signed)
Ok with change to [place] for formulary preference.   Laroy Apple, MD [facility] 04/04/2017, 2:36 PM

## 2017-04-10 NOTE — Telephone Encounter (Signed)
Patient aware medication change and has already picked medication up

## 2017-04-17 ENCOUNTER — Other Ambulatory Visit: Payer: Self-pay | Admitting: Family Medicine

## 2017-04-17 DIAGNOSIS — E1165 Type 2 diabetes mellitus with hyperglycemia: Principal | ICD-10-CM

## 2017-04-17 DIAGNOSIS — IMO0001 Reserved for inherently not codable concepts without codable children: Secondary | ICD-10-CM

## 2017-04-17 DIAGNOSIS — I10 Essential (primary) hypertension: Secondary | ICD-10-CM

## 2017-04-25 ENCOUNTER — Ambulatory Visit: Payer: BC Managed Care – PPO | Admitting: Family Medicine

## 2017-05-12 ENCOUNTER — Other Ambulatory Visit: Payer: Self-pay | Admitting: Family Medicine

## 2017-05-23 ENCOUNTER — Encounter: Payer: Self-pay | Admitting: Family Medicine

## 2017-05-23 ENCOUNTER — Ambulatory Visit (INDEPENDENT_AMBULATORY_CARE_PROVIDER_SITE_OTHER): Payer: BC Managed Care – PPO | Admitting: Family Medicine

## 2017-05-23 VITALS — BP 126/82 | HR 73 | Temp 97.2°F | Ht 65.0 in | Wt 201.6 lb

## 2017-05-23 DIAGNOSIS — M25562 Pain in left knee: Secondary | ICD-10-CM

## 2017-05-23 DIAGNOSIS — M25561 Pain in right knee: Secondary | ICD-10-CM

## 2017-05-23 DIAGNOSIS — I10 Essential (primary) hypertension: Secondary | ICD-10-CM | POA: Diagnosis not present

## 2017-05-23 DIAGNOSIS — G8929 Other chronic pain: Secondary | ICD-10-CM | POA: Diagnosis not present

## 2017-05-23 DIAGNOSIS — E118 Type 2 diabetes mellitus with unspecified complications: Secondary | ICD-10-CM

## 2017-05-23 LAB — BAYER DCA HB A1C WAIVED: HB A1C (BAYER DCA - WAIVED): 7.2 % — ABNORMAL HIGH (ref <7.0)

## 2017-05-23 MED ORDER — MELOXICAM 15 MG PO TABS: 15.0000 mg | ORAL_TABLET | Freq: Every day | ORAL | 3 refills | Status: DC

## 2017-05-23 NOTE — Progress Notes (Signed)
   HPI  Patient presents today here for three-month follow-up of chronic medical conditions.  Type 2 diabetes Good medication compliance, no hypoglycemia Watching diet moderately. Patient checking blood sugars sporadically, cannot remember fasting blood sugar range.  Hypertension Good medication compliance, no chest pain or dyspnea.  PMH: Smoking status noted ROS: Per HPI  Objective: BP 126/82   Pulse 73   Temp 97.2 F (36.2 C) (Oral)   Ht 5\' 5"  (1.651 m)   Wt 201 lb 9.6 oz (91.4 kg)   BMI 33.55 kg/m  Gen: NAD, alert, cooperative with exam HEENT: NCAT CV: RRR, good S1/S2, no murmur Resp: CTABL, no wheezes, non-labored Ext: No edema, warm Neuro: Alert and oriented, No gross deficits Diabetic Foot Exam - Simple   Simple Foot Form Diabetic Foot exam was performed with the following findings:  Yes 05/23/2017  8:45 AM  Visual Inspection No deformities, no ulcerations, no other skin breakdown bilaterally:  Yes Sensation Testing Intact to touch and monofilament testing bilaterally:  Yes Pulse Check Posterior Tibialis and Dorsalis pulse intact bilaterally:  Yes Comments      Assessment and plan:  # Type 2 diabetes Control improving, A1c changing from 7.8-7.2. Continue current medication regimen.   # Bilateral knee pain Chronic bilateral knee pain likely due to OA Helped by meloxicam, patient did not tolerate 7.5 mg meloxicam, refill  # Hypertension Good medication compliance, controlled No changes, continue Prinzide    Orders Placed This Encounter  Procedures  . Bayer DCA Hb A1c Waived    Meds ordered this encounter  Medications  . meloxicam (MOBIC) 15 MG tablet    Sig: Take 1 tablet (15 mg total) by mouth daily.    Dispense:  90 tablet    Refill:  3    Please consider 90 day supplies to promote better adherence    Laroy Apple, MD [facility] 05/23/2017, 8:50 AM

## 2017-05-23 NOTE — Patient Instructions (Signed)
Great to see yoU!  Come back in 3 months unless you need us sooner.    

## 2017-06-07 ENCOUNTER — Other Ambulatory Visit: Payer: Self-pay | Admitting: Family Medicine

## 2017-06-07 DIAGNOSIS — E785 Hyperlipidemia, unspecified: Secondary | ICD-10-CM

## 2017-06-16 ENCOUNTER — Ambulatory Visit (INDEPENDENT_AMBULATORY_CARE_PROVIDER_SITE_OTHER): Payer: BC Managed Care – PPO | Admitting: Physician Assistant

## 2017-06-16 ENCOUNTER — Encounter: Payer: Self-pay | Admitting: Physician Assistant

## 2017-06-16 VITALS — BP 121/75 | HR 72 | Temp 97.7°F | Ht 65.0 in | Wt 201.8 lb

## 2017-06-16 DIAGNOSIS — M25562 Pain in left knee: Secondary | ICD-10-CM | POA: Diagnosis not present

## 2017-06-16 NOTE — Patient Instructions (Signed)

## 2017-06-16 NOTE — Progress Notes (Signed)
Subjective:     Patient ID: Austin Ortiz, male   DOB: 03-02-1951, 66 y.o.   MRN: [id number]  HPI Pt with l knee pain He has had chronic issue due to arthritis but has been doing well on Mobic This week he noted sig increase in pain Does not know if it was related to gout Per pt hx of gout flare several years ago but no problems since He has been on the tractor more mowing so not sure if due to arthritis flare or gout Sx have improved since making appt  Review of Systems  Musculoskeletal: Positive for arthralgias and joint swelling.       Objective:   Physical Exam  Constitutional: He appears well-developed and well-nourished.  Nursing note and vitals reviewed.  No erythema or induration to joint No TTP of the joint FROM of the joint with sl crepitus noted No laxity Good strength distal No distal edema noted    Assessment:     1. Acute pain of left knee        Plan:     Pt would like to hold on uric acid today Hydrate well Continue with Mobic Pt and family will discuss further They will discuss with Dr Wendi Snipes at his regular f/u

## 2017-07-20 ENCOUNTER — Other Ambulatory Visit: Payer: Self-pay | Admitting: Family Medicine

## 2017-08-14 ENCOUNTER — Other Ambulatory Visit: Payer: Self-pay | Admitting: Family Medicine

## 2017-08-14 DIAGNOSIS — K2100 Gastro-esophageal reflux disease with esophagitis, without bleeding: Secondary | ICD-10-CM

## 2017-08-25 ENCOUNTER — Ambulatory Visit (INDEPENDENT_AMBULATORY_CARE_PROVIDER_SITE_OTHER): Payer: BC Managed Care – PPO | Admitting: Family Medicine

## 2017-08-25 ENCOUNTER — Encounter: Payer: Self-pay | Admitting: Family Medicine

## 2017-08-25 VITALS — BP 116/66 | HR 65 | Temp 97.4°F | Ht 65.0 in | Wt 202.0 lb

## 2017-08-25 DIAGNOSIS — I10 Essential (primary) hypertension: Secondary | ICD-10-CM

## 2017-08-25 DIAGNOSIS — B379 Candidiasis, unspecified: Secondary | ICD-10-CM

## 2017-08-25 DIAGNOSIS — E118 Type 2 diabetes mellitus with unspecified complications: Secondary | ICD-10-CM | POA: Diagnosis not present

## 2017-08-25 LAB — BAYER DCA HB A1C WAIVED: HB A1C (BAYER DCA - WAIVED): 8.6 % — ABNORMAL HIGH (ref <7.0)

## 2017-08-25 MED ORDER — FLUCONAZOLE 150 MG PO TABS: ORAL_TABLET | ORAL | 1 refills | Status: DC

## 2017-08-25 NOTE — Progress Notes (Signed)
   HPI  Patient presents today here to follow-up for chronic medical conditions and concern for yeast infection.  Type 2 diabetes Good medication compliance, he suspects that he had a side effect causing yeast infection on the head of his penis. He is circumcised. Denies any low blood sugars He is watching his diet. Not checking blood sugars recently.  Hypertension No headache, chest pain, leg edema or palpitations.  PMH: Smoking status noted ROS: Per HPI  Objective: BP 116/66 (BP Location: Right Arm)   Pulse 65   Temp (!) 97.4 F (36.3 C) (Oral)   Ht 5\' 5"  (1.651 m)   Wt 202 lb (91.6 kg)   BMI 33.61 kg/m  Gen: NAD, alert, cooperative with exam HEENT: NCAT CV: RRR, good S1/S2, no murmur Resp: CTABL, no wheezes, non-labored Ext: trace pitting edema Neuro: Alert and oriented, No gross deficits  Assessment and plan:  # Type 2 diabetes Expect reasonable control, A1c pending Continue current regimen Yeast infection likely due to SGL T2  # Yeast infection Given Diflucan, currently cleared Patient may keep Diflucan for any future symptoms. This is a known side effect of his medication  # Hypertension Well-controlled on current medications No changes   Orders Placed This Encounter  Procedures  . Bayer DCA Hb A1c Waived  . Microalbumin / creatinine urine ratio    Meds ordered this encounter  Medications  . fluconazole (DIFLUCAN) 150 MG tablet    Sig: Take one pill and repeat in 3 days    Dispense:  2 tablet    Refill:  [place], MD [facility] 08/25/2017, 8:19 AM

## 2017-08-25 NOTE — Patient Instructions (Addendum)
  Great to see you!  Use diflucan 1 pill as needed for yeast infection. Please let me know if it is becoming frequent.    Continue current medications. Continue good therapeutic lifestyle changes which include good diet and exercise. Fall precautions discussed with patient. If an FOBT was given today- please return it to our front desk. If you are over 65 years old - you may need Prevnar 67 or the adult Pneumonia vaccine.  **Flu shots are available--- please call and schedule a FLU-CLINIC appointment**  After your visit with [place] today you will receive a survey in the mail or online from [employer] regarding your care with [place]. Please take a moment to fill this out. Your feedback is very important to [place] as you can help [place] better understand your patient needs as well as improve your experience and satisfaction. WE CARE ABOUT YOU!!!

## 2017-08-26 LAB — MICROALBUMIN / CREATININE URINE RATIO
CREATININE, UR: 102.9 mg/dL
Microalb/Creat Ratio: 6.9 mg/g creat (ref 0.0–30.0)
Microalbumin, Urine: 7.1 ug/mL

## 2017-09-13 ENCOUNTER — Other Ambulatory Visit: Payer: Self-pay | Admitting: Family Medicine

## 2017-09-13 DIAGNOSIS — E785 Hyperlipidemia, unspecified: Secondary | ICD-10-CM

## 2017-10-26 ENCOUNTER — Other Ambulatory Visit: Payer: Self-pay | Admitting: Family Medicine

## 2017-11-24 ENCOUNTER — Other Ambulatory Visit: Payer: Self-pay | Admitting: Family Medicine

## 2017-11-27 ENCOUNTER — Ambulatory Visit: Payer: Medicare HMO | Admitting: Family Medicine

## 2017-11-27 ENCOUNTER — Encounter: Payer: Self-pay | Admitting: Family Medicine

## 2017-11-27 VITALS — BP 120/69 | HR 72 | Temp 97.1°F | Ht 65.0 in | Wt 201.8 lb

## 2017-11-27 DIAGNOSIS — E118 Type 2 diabetes mellitus with unspecified complications: Secondary | ICD-10-CM

## 2017-11-27 DIAGNOSIS — Z23 Encounter for immunization: Secondary | ICD-10-CM

## 2017-11-27 DIAGNOSIS — I10 Essential (primary) hypertension: Secondary | ICD-10-CM | POA: Diagnosis not present

## 2017-11-27 DIAGNOSIS — E785 Hyperlipidemia, unspecified: Secondary | ICD-10-CM

## 2017-11-27 LAB — BAYER DCA HB A1C WAIVED: HB A1C (BAYER DCA - WAIVED): 7.8 % — ABNORMAL HIGH (ref <7.0)

## 2017-11-27 NOTE — Patient Instructions (Signed)
Great to see you!  I would recommend increasing januvia to 1 pill twice daily and if you are controlled we can consider cutting back on amaryl.

## 2017-11-27 NOTE — Progress Notes (Signed)
   HPI  Patient presents today for follow-up chronic medical conditions.  Here to follow-up for chronic medical conditions.  Type 2 diabetes. Patient states he is taking Janumet only once daily. Average fasting blood sugar is low 200s. Average during the day is 150-200. Patient is watching his diet moderately.  He is occupationally active around the house.  No hypoglycemia.  Good medication compliance with antihypertensives and Zocor. No headache or chest pain.   We discussed his hand pain in the cold weather which sounds like mild Raynaud's phenomenon  PMH: Smoking status noted ROS: Per HPI  Objective: BP 120/69   Pulse 72   Temp (!) 97.1 F (36.2 C) (Oral)   Ht '5\' 5"'$  (1.651 m)   Wt 201 lb 12.8 oz (91.5 kg)   BMI 33.58 kg/m  Gen: NAD, alert, cooperative with exam HEENT: NCAT, EOMI, PERRL CV: RRR, good S1/S2, no murmur Resp: CTABL, no wheezes, non-labored Ext: No edema, warm Neuro: Alert and oriented, No gross deficits  Diabetic Foot Exam - Simple   Simple Foot Form Diabetic Foot exam was performed with the following findings:  Yes 11/27/2017  8:26 AM  Visual Inspection No deformities, no ulcerations, no other skin breakdown bilaterally:  Yes Sensation Testing Intact to touch and monofilament testing bilaterally:  Yes Pulse Check Posterior Tibialis and Dorsalis pulse intact bilaterally:  Yes Comments Right great toenail curling      Assessment and plan:  #Type 2 diabetes A1c pending, possibly still uncontrolled Patient will increase Januvia to twice daily and if needing to cut back will cut back on Amaryl. Continue farxiga  # HLD Clinically well controlled, continue simvastatin Labs today  #Hypertension Well controlled on Prinzide, no changes     Orders Placed This Encounter  Procedures  . Bayer DCA Hb A1c Waived  . CMP14+EGFR  . CBC with Differential/Platelet  . Lipid panel    Laroy Apple, MD [facility] 11/27/2017, 8:35 AM

## 2017-11-28 ENCOUNTER — Telehealth: Payer: Self-pay | Admitting: Family Medicine

## 2017-11-28 LAB — CMP14+EGFR
A/G RATIO: 1.6 (ref 1.2–2.2)
ALBUMIN: 4.5 g/dL (ref 3.6–4.8)
ALT: 31 IU/L (ref 0–44)
AST: 21 IU/L (ref 0–40)
Alkaline Phosphatase: 42 IU/L (ref 39–117)
BILIRUBIN TOTAL: 0.2 mg/dL (ref 0.0–1.2)
BUN / CREAT RATIO: 23 (ref 10–24)
BUN: 21 mg/dL (ref 8–27)
CHLORIDE: 101 mmol/L (ref 96–106)
CO2: 20 mmol/L (ref 20–29)
Calcium: 9.4 mg/dL (ref 8.6–10.2)
Creatinine, Ser: 0.92 mg/dL (ref 0.76–1.27)
GFR calc non Af Amer: 87 mL/min/{1.73_m2} (ref >59)
GFR, EST AFRICAN AMERICAN: 101 mL/min/{1.73_m2} (ref >59)
GLOBULIN, TOTAL: 2.8 g/dL (ref 1.5–4.5)
Glucose: 171 mg/dL — ABNORMAL HIGH (ref 65–99)
POTASSIUM: 4.5 mmol/L (ref 3.5–5.2)
Sodium: 140 mmol/L (ref 134–144)
TOTAL PROTEIN: 7.3 g/dL (ref 6.0–8.5)

## 2017-11-28 LAB — CBC WITH DIFFERENTIAL/PLATELET
BASOS ABS: 0 10*3/uL (ref 0.0–0.2)
Basos: 0 %
EOS (ABSOLUTE): 0.2 10*3/uL (ref 0.0–0.4)
Eos: 3 %
HEMOGLOBIN: 15.1 g/dL (ref 13.0–17.7)
Hematocrit: 45.9 % (ref 37.5–51.0)
Immature Grans (Abs): 0 10*3/uL (ref 0.0–0.1)
Immature Granulocytes: 0 %
LYMPHS ABS: 2.3 10*3/uL (ref 0.7–3.1)
Lymphs: 31 %
MCH: 30.4 pg (ref 26.6–33.0)
MCHC: 32.9 g/dL (ref 31.5–35.7)
MCV: 92 fL (ref 79–97)
MONOCYTES: 11 %
MONOS ABS: 0.8 10*3/uL (ref 0.1–0.9)
NEUTROS ABS: 4.1 10*3/uL (ref 1.4–7.0)
Neutrophils: 55 %
Platelets: 248 10*3/uL (ref 150–379)
RBC: 4.97 x10E6/uL (ref 4.14–5.80)
RDW: 13.9 % (ref 12.3–15.4)
WBC: 7.5 10*3/uL (ref 3.4–10.8)

## 2017-11-28 LAB — LIPID PANEL
CHOLESTEROL TOTAL: 122 mg/dL (ref 100–199)
Chol/HDL Ratio: 4.1 ratio (ref 0.0–5.0)
HDL: 30 mg/dL — ABNORMAL LOW (ref >39)
LDL CALC: 28 mg/dL (ref 0–99)
Triglycerides: 319 mg/dL — ABNORMAL HIGH (ref 0–149)
VLDL Cholesterol Cal: 64 mg/dL — ABNORMAL HIGH (ref 5–40)

## 2017-11-28 NOTE — Telephone Encounter (Signed)
Patient aware and verbalizes understanding. 

## 2017-12-18 ENCOUNTER — Other Ambulatory Visit: Payer: Self-pay | Admitting: Family Medicine

## 2018-01-31 DIAGNOSIS — R69 Illness, unspecified: Secondary | ICD-10-CM | POA: Diagnosis not present

## 2018-02-13 DIAGNOSIS — R69 Illness, unspecified: Secondary | ICD-10-CM | POA: Diagnosis not present

## 2018-02-22 DIAGNOSIS — R69 Illness, unspecified: Secondary | ICD-10-CM | POA: Diagnosis not present

## 2018-02-26 ENCOUNTER — Encounter: Payer: Self-pay | Admitting: Family Medicine

## 2018-02-26 ENCOUNTER — Ambulatory Visit (INDEPENDENT_AMBULATORY_CARE_PROVIDER_SITE_OTHER): Payer: Medicare HMO | Admitting: Family Medicine

## 2018-02-26 VITALS — BP 109/71 | HR 77 | Temp 96.7°F | Ht 65.0 in | Wt 200.2 lb

## 2018-02-26 DIAGNOSIS — K2100 Gastro-esophageal reflux disease with esophagitis, without bleeding: Secondary | ICD-10-CM

## 2018-02-26 DIAGNOSIS — E118 Type 2 diabetes mellitus with unspecified complications: Secondary | ICD-10-CM | POA: Diagnosis not present

## 2018-02-26 DIAGNOSIS — I10 Essential (primary) hypertension: Secondary | ICD-10-CM

## 2018-02-26 LAB — BAYER DCA HB A1C WAIVED: HB A1C (BAYER DCA - WAIVED): 7.6 % — ABNORMAL HIGH (ref <7.0)

## 2018-02-26 MED ORDER — OMEPRAZOLE 40 MG PO CPDR: DELAYED_RELEASE_CAPSULE | ORAL | 3 refills | Status: DC

## 2018-02-26 NOTE — Progress Notes (Signed)
   HPI  Patient presents today follow-up chronic medical conditions.  Diabetes No eye exam this year. Good medication compliance, however still taking Janumet only once daily.  Hypertension Good medication compliance with Prinzide. No headache or chest pain.  GERD Good medication compliance with omeprazole, needs refill. No complaints  PMH: Smoking status noted ROS: Per HPI  Objective: BP 109/71   Pulse 77   Temp (!) 96.7 F (35.9 C) (Oral)   Ht 5\' 5"  (1.651 m)   Wt 200 lb 3.2 oz (90.8 kg)   BMI 33.32 kg/m  Gen: NAD, alert, cooperative with exam HEENT: NCAT, EOMI, PERRL CV: RRR, good S1/S2, no murmur Resp: CTABL, no wheezes, non-labored Abd: SNTND, BS present, no guarding or organomegaly Ext: No edema, warm Neuro: Alert and oriented, No gross deficits  Diabetic Foot Exam - Simple   Simple Foot Form Diabetic Foot exam was performed with the following findings:  Yes 02/26/2018  9:11 AM  Visual Inspection No deformities, no ulcerations, no other skin breakdown bilaterally:  Yes Sensation Testing Intact to touch and monofilament testing bilaterally:  Yes Pulse Check Posterior Tibialis and Dorsalis pulse intact bilaterally:  Yes Comments      Assessment and plan:  #Type 2 diabetes Hopefully well controlled, previously less than 8, recommended decreasing Amaryl which he did not do. Patient will start taking Janumet twice daily.   #Hypertension Very well controlled No changes Labs up-to-date  #GERD Well controlled with PPI, refill    Orders Placed This Encounter  Procedures  . Bayer DCA Hb A1c Waived    Meds ordered this encounter  Medications  . omeprazole (PRILOSEC) 40 MG capsule    Sig: TAKE ONE CAPSULE BY MOUTH ONCE DAILY AT BEDTIME    Dispense:  90 capsule    Refill:  [place], MD [facility] 02/26/2018, 9:12 AM

## 2018-02-28 NOTE — Telephone Encounter (Signed)
error 

## 2018-03-09 DIAGNOSIS — R69 Illness, unspecified: Secondary | ICD-10-CM | POA: Diagnosis not present

## 2018-03-29 ENCOUNTER — Other Ambulatory Visit: Payer: Self-pay | Admitting: Family Medicine

## 2018-03-29 DIAGNOSIS — E785 Hyperlipidemia, unspecified: Secondary | ICD-10-CM

## 2018-04-23 ENCOUNTER — Other Ambulatory Visit: Payer: Self-pay | Admitting: Nurse Practitioner

## 2018-04-23 DIAGNOSIS — IMO0001 Reserved for inherently not codable concepts without codable children: Secondary | ICD-10-CM

## 2018-04-23 DIAGNOSIS — I10 Essential (primary) hypertension: Secondary | ICD-10-CM

## 2018-04-23 DIAGNOSIS — E1165 Type 2 diabetes mellitus with hyperglycemia: Principal | ICD-10-CM

## 2018-05-16 ENCOUNTER — Other Ambulatory Visit: Payer: Self-pay | Admitting: Family Medicine

## 2018-05-31 ENCOUNTER — Ambulatory Visit (INDEPENDENT_AMBULATORY_CARE_PROVIDER_SITE_OTHER): Payer: Medicare HMO | Admitting: Family Medicine

## 2018-05-31 ENCOUNTER — Encounter: Payer: Self-pay | Admitting: Family Medicine

## 2018-05-31 VITALS — BP 118/69 | HR 69 | Temp 97.1°F | Ht 65.0 in | Wt 199.0 lb

## 2018-05-31 DIAGNOSIS — I10 Essential (primary) hypertension: Secondary | ICD-10-CM

## 2018-05-31 DIAGNOSIS — M25512 Pain in left shoulder: Secondary | ICD-10-CM | POA: Diagnosis not present

## 2018-05-31 DIAGNOSIS — E118 Type 2 diabetes mellitus with unspecified complications: Secondary | ICD-10-CM

## 2018-05-31 LAB — BAYER DCA HB A1C WAIVED: HB A1C (BAYER DCA - WAIVED): 7.8 % — ABNORMAL HIGH (ref <7.0)

## 2018-05-31 NOTE — Patient Instructions (Signed)
Great to see you!  Come back in 2-3 weeks for shoulder injection if needed  Otherwise come back to see Dr. Warrick Parisian in 3 months

## 2018-05-31 NOTE — Progress Notes (Signed)
   HPI  Patient presents today chronic medical conditions.  Type 2 diabetes Watching diet, not checking sugars regularly Good medication compliance and tolerance, easily recounts his regimen.  Hypertension Good medication compliance, no headache or chest pain.  Left shoulder pain 1 month of left shoulder pain over the deltoid. Worse at night while trying to sleep Patient has been working hard lately in the harvest, baling hay.   PMH: Smoking status noted ROS: Per HPI  Objective: BP 118/69   Pulse 69   Temp (!) 97.1 F (36.2 C) (Oral)   Ht 5' 5" (1.651 m)   Wt 199 lb (90.3 kg)   BMI 33.12 kg/m  Gen: NAD, alert, cooperative with exam HEENT: NCAT, EOMI, PERRL CV: RRR, good S1/S2, no murmur Resp: CTABL, no wheezes, non-labored Ext: No edema, warm Neuro: Alert and oriented, No gross deficits MSK:  Reasonably preserved range of motion bilateral shoulders, positive Hawkins and empty can test on the left  Diabetic Foot Exam - Simple   Simple Foot Form Diabetic Foot exam was performed with the following findings:  Yes 05/31/2018  8:21 AM  Visual Inspection No deformities, no ulcerations, no other skin breakdown bilaterally:  Yes Sensation Testing Intact to touch and monofilament testing bilaterally:  Yes Pulse Check Posterior Tibialis and Dorsalis pulse intact bilaterally:  Yes Comments       Assessment and plan:  #Type 2 diabetes Reasonably well-controlled previously, A1c pending. Good medication compliance No changes for now  #Left shoulder pain Rotator cuff tendinitis most likely Conservative therapy x2 to 3 weeks, return for injection if not improved  #Hypertension Well-controlled No changes    Orders Placed This Encounter  Procedures  . Bayer DCA Hb A1c Waived  . [place], MD [facility] 05/31/2018, 8:23 AM

## 2018-06-01 LAB — CMP14+EGFR
A/G RATIO: 1.8 (ref 1.2–2.2)
ALBUMIN: 4.4 g/dL (ref 3.6–4.8)
ALK PHOS: 39 IU/L (ref 39–117)
ALT: 32 IU/L (ref 0–44)
AST: 21 IU/L (ref 0–40)
BUN / CREAT RATIO: 25 — AB (ref 10–24)
BUN: 21 mg/dL (ref 8–27)
Bilirubin Total: 0.3 mg/dL (ref 0.0–1.2)
CALCIUM: 9.1 mg/dL (ref 8.6–10.2)
CO2: 23 mmol/L (ref 20–29)
CREATININE: 0.85 mg/dL (ref 0.76–1.27)
Chloride: 103 mmol/L (ref 96–106)
GFR calc Af Amer: 106 mL/min/{1.73_m2} (ref >59)
GFR, EST NON AFRICAN AMERICAN: 91 mL/min/{1.73_m2} (ref >59)
GLOBULIN, TOTAL: 2.5 g/dL (ref 1.5–4.5)
Glucose: 167 mg/dL — ABNORMAL HIGH (ref 65–99)
POTASSIUM: 4.8 mmol/L (ref 3.5–5.2)
SODIUM: 140 mmol/L (ref 134–144)
Total Protein: 6.9 g/dL (ref 6.0–8.5)

## 2018-06-13 ENCOUNTER — Other Ambulatory Visit: Payer: Self-pay | Admitting: Family Medicine

## 2018-06-14 ENCOUNTER — Encounter: Payer: Self-pay | Admitting: Family Medicine

## 2018-06-14 ENCOUNTER — Ambulatory Visit (INDEPENDENT_AMBULATORY_CARE_PROVIDER_SITE_OTHER): Payer: Medicare HMO | Admitting: Family Medicine

## 2018-06-14 VITALS — BP 128/79 | HR 68 | Temp 97.2°F | Ht 65.0 in | Wt 201.0 lb

## 2018-06-14 DIAGNOSIS — M25512 Pain in left shoulder: Secondary | ICD-10-CM | POA: Diagnosis not present

## 2018-06-14 NOTE — Progress Notes (Signed)
   HPI  Patient presents today here for left shoulder pain.  Patient was diagnosed with rotator cuff tendinitis of the left shoulder last visit, recommended 2 to 3 weeks of conservative treatment and return if he wanted subacromial bursal injection.  He returns for injection today, however whenever we discussed the issue he states he actually feels a little bit better, he states that he has not slowed down at all but is about to go on vacation and will actually take a rest.  He feels better after 1 day of rest.  Patient does not feel the pain is so significant that it will interfere with his vacation.   PMH: Smoking status noted ROS: Per HPI  Objective: BP 128/79   Pulse 68   Temp (!) 97.2 F (36.2 C) (Oral)   Ht 5\' 5"  (1.651 m)   Wt 201 lb (91.2 kg)   BMI 33.45 kg/m  Gen: NAD, alert, cooperative with exam HEENT: NCAT Ext: No edema, warm Neuro: Alert and oriented, No gross deficits  Assessment and plan:  #Shoulder pain L Sided rotator cuff tendinitis likely  Continue conservative therapy, injection deferred today Return to clinic if he desires    Laroy Apple, MD [facility] 06/14/2018, 8:04 AM

## 2018-06-26 ENCOUNTER — Other Ambulatory Visit: Payer: Self-pay | Admitting: Family Medicine

## 2018-07-03 ENCOUNTER — Ambulatory Visit (INDEPENDENT_AMBULATORY_CARE_PROVIDER_SITE_OTHER): Payer: Medicare HMO | Admitting: Family Medicine

## 2018-07-03 ENCOUNTER — Encounter: Payer: Self-pay | Admitting: Family Medicine

## 2018-07-03 VITALS — BP 126/81 | HR 70 | Temp 97.0°F | Ht 65.0 in | Wt 200.4 lb

## 2018-07-03 DIAGNOSIS — E785 Hyperlipidemia, unspecified: Secondary | ICD-10-CM

## 2018-07-03 DIAGNOSIS — Z0000 Encounter for general adult medical examination without abnormal findings: Secondary | ICD-10-CM

## 2018-07-03 DIAGNOSIS — Z23 Encounter for immunization: Secondary | ICD-10-CM | POA: Diagnosis not present

## 2018-07-03 MED ORDER — SIMVASTATIN 40 MG PO TABS: 40.0000 mg | ORAL_TABLET | Freq: Every evening | ORAL | 3 refills | Status: DC

## 2018-07-03 NOTE — Progress Notes (Signed)
Subjective:   Austin Ortiz is a 67 y.o. male who presents for a Welcome to Medicare exam.   Review of Systems: No headaches No chest pain No shortness of breath No abdominal pain No persistent constipation. Cardiac Risk Factors include: male gender;diabetes mellitus;hypertension     Objective:    Today's Vitals   07/03/18 1059  BP: 126/81  Pulse: 70  Temp: (!) 97 F (36.1 C)  TempSrc: Oral  Weight: 200 lb 6.4 oz (90.9 kg)  Height: 5\' 5"  (1.651 m)   Body mass index is 33.35 kg/m.  Medications Outpatient Encounter Medications as of 07/03/2018  Medication Sig  . aspirin 81 MG tablet Take 81 mg by mouth daily.  [name] FARXIGA 10 MG TABS tablet TAKE 1 TABLET BY MOUTH ONCE DAILY  . glimepiride (AMARYL) 4 MG tablet TAKE 1 TABLET BY MOUTH ONCE DAILY IN THE MORNING BEFORE  BREAKFAST  (NEEDS  APPOINTMENT)  . JANUMET 50-1000 MG tablet TAKE 1 TABLET BY MOUTH TWICE DAILY WITH A MEAL  . lisinopril-hydrochlorothiazide (PRINZIDE,ZESTORETIC) 20-12.5 MG tablet TAKE 1 TABLET BY MOUTH ONCE DAILY  . meloxicam (MOBIC) 15 MG tablet TAKE 1 TABLET BY MOUTH ONCE DAILY  . Multiple Vitamin (MULTIVITAMIN) tablet Take 1 tablet by mouth daily.  . Omega-3 Fatty Acids (FISH OIL) 600 MG CAPS Take 3,000 mg by mouth daily.   [name] omeprazole (PRILOSEC) 40 MG capsule TAKE ONE CAPSULE BY MOUTH ONCE DAILY AT BEDTIME  . simvastatin (ZOCOR) 40 MG tablet Take 1 tablet (40 mg total) by mouth every evening.  . [DISCONTINUED] simvastatin (ZOCOR) 40 MG tablet TAKE 1 TABLET BY MOUTH ONCE DAILY IN THE EVENING   No facility-administered encounter medications on file as of 07/03/2018.      History: Past Medical History:  Diagnosis Date  . Diabetes mellitus   . Esophageal stricture   . Foreign body in subcutaneous tissue    metal off of a machine, epigastric abd. area  . Hyperlipidemia   . Hypertension   . Kidney stones   . Obstructive sleep apnea 05/28/2013   Past Surgical History:  Procedure Laterality Date  . neg  hx      Family History  Problem Relation Age of Onset  . Heart disease Mother   . Hypertension Mother   . Diabetes type II Paternal Aunt        x2  . Nephrolithiasis Maternal Aunt   . Breast cancer Maternal Aunt   . Heart attack Father        x 2  . Diabetes Maternal Aunt   . Diabetes Cousin        mat. side  . Colon cancer Neg Hx    Social History   Occupational History  . Occupation: self employed- farmer  Tobacco Use  . Smoking status: Never Smoker  . Smokeless tobacco: Never Used  Substance and Sexual Activity  . Alcohol use: No  . Drug use: No  . Sexual activity: Not on file   Tobacco Counseling Counseling given: Not Answered   Immunizations and Health Maintenance Immunization History  Administered Date(s) Administered  . Influenza, High Dose Seasonal PF 11/27/2017  . Influenza,inj,Quad PF,6+ Mos 09/25/2013, 10/01/2016  . Influenza-Unspecified 09/26/2015  . Pneumococcal Conjugate-13 12/13/2013  . Tdap 03/29/2012  . Zoster 07/15/2015   Health Maintenance Due  Topic Date Due  . Hepatitis C Screening  [birth year]  . HIV Screening  10/13/1967  . COLON CANCER SCREENING ANNUAL FOBT  10/12/2002  . OPHTHALMOLOGY EXAM  03/02/2015  .  PNA vac Low Risk Adult (2 of 2 - PPSV23) 10/12/2017    Activities of Daily Living In your present state of health, do you have any difficulty performing the following activities: 07/03/2018  Hearing? Y  Vision? N  Difficulty concentrating or making decisions? N  Walking or climbing stairs? Y  Dressing or bathing? N  Doing errands, shopping? N  Preparing Food and eating ? N  Using the Toilet? N  In the past six months, have you accidently leaked urine? N  Do you have problems with loss of bowel control? N  Managing your Medications? N  Managing your Finances? N  Housekeeping or managing your Housekeeping? N  Some recent data might be hidden    Physical Exam    Gen: NAD, alert, cooperative with exam HEENT: NCAT CV: RRR,  good S1/S2 Resp: CTABL, no wheezes, non-labored Abd: SNTND, BS present, no guarding or organomegaly Ext: No edema, warm Neuro: Alert and oriented, No gross deficits   Advanced Directives: Does Patient Have a Medical Advance Directive?: No Would patient like information on creating a medical advance directive?: No - Patient declined    Assessment:    This is a routine wellness  examination for this patient .   Vision/Hearing screen No exam data present  Dietary issues and exercise activities discussed:  Current Exercise Habits: Home exercise routine, Type of exercise: walking, Time (Minutes): 30, Frequency (Times/Week): >7, Weekly Exercise (Minutes/Week): 0, Intensity: Mild  Goals    None      Depression Screen PHQ 2/9 Scores 07/03/2018 06/14/2018 05/31/2018 02/26/2018  PHQ - 2 Score 0 0 0 0     Fall Risk Fall Risk  07/03/2018  Falls in the past year? No    Cognitive Function MMSE - Mini Mental State Exam 07/03/2018  Orientation to time 5  Orientation to Place 4  Registration 3  Attention/ Calculation 4  Recall 3  Language- name 2 objects 2  Language- repeat 1  Language- follow 3 step command 3  Language- read & follow direction 1  Write a sentence 1  Copy design 1  Total score 28        Patient Care Team: Timmothy Euler, MD as PCP - General (Family Medicine)     Plan:   Discussed advanced directives. Pt was given pneumovax today. Recommended HIV and Hep C with next blood draw.     I have personally reviewed and noted the following in the patient's chart:   . Medical and social history . Use of alcohol, tobacco or illicit drugs  . Current medications and supplements . Functional ability and status . Nutritional status . Physical activity . Advanced directives . List of other physicians . Hospitalizations, surgeries, and ER visits in previous 12 months . Vitals . Screenings to include cognitive, depression, and falls . Referrals and appointments  In  addition, I have reviewed and discussed with patient certain preventive protocols, quality metrics, and best practice recommendations. A written personalized care plan for preventive services as well as general preventive health recommendations were provided to patient.    Kenn File, MD 07/03/2018

## 2018-07-03 NOTE — Addendum Note (Signed)
Addended by: Nigel Berthold C on: 07/03/2018 11:36 AM   Modules accepted: Orders

## 2018-07-03 NOTE — Patient Instructions (Signed)
Great to see you!  Come back just after Sept 6 to see Dr. Warrick Parisian

## 2018-07-20 ENCOUNTER — Other Ambulatory Visit: Payer: Self-pay | Admitting: Nurse Practitioner

## 2018-07-20 DIAGNOSIS — IMO0001 Reserved for inherently not codable concepts without codable children: Secondary | ICD-10-CM

## 2018-07-20 DIAGNOSIS — E1165 Type 2 diabetes mellitus with hyperglycemia: Principal | ICD-10-CM

## 2018-07-30 DIAGNOSIS — R69 Illness, unspecified: Secondary | ICD-10-CM | POA: Diagnosis not present

## 2018-08-31 ENCOUNTER — Ambulatory Visit (INDEPENDENT_AMBULATORY_CARE_PROVIDER_SITE_OTHER): Payer: Medicare HMO | Admitting: Family Medicine

## 2018-08-31 ENCOUNTER — Encounter: Payer: Self-pay | Admitting: Family Medicine

## 2018-08-31 VITALS — BP 111/71 | HR 71 | Temp 97.9°F | Ht 65.0 in | Wt 198.8 lb

## 2018-08-31 DIAGNOSIS — K2100 Gastro-esophageal reflux disease with esophagitis, without bleeding: Secondary | ICD-10-CM

## 2018-08-31 DIAGNOSIS — I10 Essential (primary) hypertension: Secondary | ICD-10-CM | POA: Diagnosis not present

## 2018-08-31 DIAGNOSIS — E785 Hyperlipidemia, unspecified: Secondary | ICD-10-CM

## 2018-08-31 DIAGNOSIS — E1159 Type 2 diabetes mellitus with other circulatory complications: Secondary | ICD-10-CM

## 2018-08-31 DIAGNOSIS — S46002A Unspecified injury of muscle(s) and tendon(s) of the rotator cuff of left shoulder, initial encounter: Secondary | ICD-10-CM | POA: Diagnosis not present

## 2018-08-31 DIAGNOSIS — E1169 Type 2 diabetes mellitus with other specified complication: Secondary | ICD-10-CM | POA: Diagnosis not present

## 2018-08-31 DIAGNOSIS — I152 Hypertension secondary to endocrine disorders: Secondary | ICD-10-CM

## 2018-08-31 LAB — CMP14+EGFR
ALK PHOS: 43 IU/L (ref 39–117)
ALT: 30 IU/L (ref 0–44)
AST: 22 IU/L (ref 0–40)
Albumin/Globulin Ratio: 1.7 (ref 1.2–2.2)
Albumin: 4.6 g/dL (ref 3.6–4.8)
BILIRUBIN TOTAL: 0.3 mg/dL (ref 0.0–1.2)
BUN/Creatinine Ratio: 27 — ABNORMAL HIGH (ref 10–24)
BUN: 23 mg/dL (ref 8–27)
CO2: 19 mmol/L — AB (ref 20–29)
CREATININE: 0.85 mg/dL (ref 0.76–1.27)
Calcium: 9.5 mg/dL (ref 8.6–10.2)
Chloride: 99 mmol/L (ref 96–106)
GFR calc Af Amer: 106 mL/min/{1.73_m2} (ref >59)
GFR calc non Af Amer: 91 mL/min/{1.73_m2} (ref >59)
GLOBULIN, TOTAL: 2.7 g/dL (ref 1.5–4.5)
GLUCOSE: 157 mg/dL — AB (ref 65–99)
Potassium: 4.5 mmol/L (ref 3.5–5.2)
SODIUM: 136 mmol/L (ref 134–144)
Total Protein: 7.3 g/dL (ref 6.0–8.5)

## 2018-08-31 LAB — CBC WITH DIFFERENTIAL/PLATELET
Basophils Absolute: 0 10*3/uL (ref 0.0–0.2)
Basos: 0 %
EOS (ABSOLUTE): 0.3 10*3/uL (ref 0.0–0.4)
EOS: 3 %
HEMATOCRIT: 45.4 % (ref 37.5–51.0)
Hemoglobin: 15.8 g/dL (ref 13.0–17.7)
IMMATURE GRANULOCYTES: 0 %
Immature Grans (Abs): 0 10*3/uL (ref 0.0–0.1)
LYMPHS ABS: 2.4 10*3/uL (ref 0.7–3.1)
Lymphs: 32 %
MCH: 31.1 pg (ref 26.6–33.0)
MCHC: 34.8 g/dL (ref 31.5–35.7)
MCV: 89 fL (ref 79–97)
MONOS ABS: 0.8 10*3/uL (ref 0.1–0.9)
Monocytes: 11 %
NEUTROS PCT: 54 %
Neutrophils Absolute: 4 10*3/uL (ref 1.4–7.0)
Platelets: 281 10*3/uL (ref 150–450)
RBC: 5.08 x10E6/uL (ref 4.14–5.80)
RDW: 13.7 % (ref 12.3–15.4)
WBC: 7.6 10*3/uL (ref 3.4–10.8)

## 2018-08-31 LAB — LIPID PANEL
Chol/HDL Ratio: 3.8 ratio (ref 0.0–5.0)
Cholesterol, Total: 127 mg/dL (ref 100–199)
HDL: 33 mg/dL — AB (ref >39)
LDL Calculated: 49 mg/dL (ref 0–99)
TRIGLYCERIDES: 225 mg/dL — AB (ref 0–149)
VLDL Cholesterol Cal: 45 mg/dL — ABNORMAL HIGH (ref 5–40)

## 2018-08-31 LAB — BAYER DCA HB A1C WAIVED: HB A1C: 8 % — AB (ref <7.0)

## 2018-08-31 MED ORDER — SITAGLIPTIN PHOS-METFORMIN HCL 50-1000 MG PO TABS: ORAL_TABLET | ORAL | 3 refills | Status: DC

## 2018-08-31 MED ORDER — DAPAGLIFLOZIN PROPANEDIOL 10 MG PO TABS: 10.0000 mg | ORAL_TABLET | Freq: Every day | ORAL | 3 refills | Status: DC

## 2018-08-31 MED ORDER — METHYLPREDNISOLONE ACETATE 80 MG/ML IJ SUSP
80.0000 mg | Freq: Once | INTRAMUSCULAR | Status: AC
  Administered 2018-08-31: 80 mg via INTRAMUSCULAR

## 2018-08-31 MED ORDER — LISINOPRIL-HYDROCHLOROTHIAZIDE 20-12.5 MG PO TABS: 1.0000 | ORAL_TABLET | Freq: Every day | ORAL | 3 refills | Status: DC

## 2018-08-31 MED ORDER — GLIMEPIRIDE 4 MG PO TABS: 4.0000 mg | ORAL_TABLET | Freq: Every day | ORAL | 3 refills | Status: DC

## 2018-08-31 NOTE — Progress Notes (Signed)
BP 111/71   Pulse 71   Temp 97.9 F (36.6 C) (Oral)   Ht 5' 5" (1.651 m)   Wt 198 lb 12.8 oz (90.2 kg)   BMI 33.08 kg/m    Subjective:    Patient ID: Austin Ortiz, male    DOB: [birth year], 67 y.o.   MRN: [id number]  HPI: Austin Ortiz is a 67 y.o. male presenting on 08/31/2018 for Diabetes (3 month follow up); Establish Care Wendi Snipes pt); and Shoulder Pain (Left. Patient states it has been going on since June and some days it is worse than others.)   HPI Type 2 diabetes mellitus Patient comes in today for recheck of his diabetes. Patient has been currently taking Janumet and glimepiride and [place]. Patient is currently on an ACE inhibitor/ARB. Patient has not seen an ophthalmologist this year. Patient denies any issues with their feet.   Hypertension Patient is currently on lisinopril-hydrochlorothiazide, and their blood pressure today is 111/71. Patient denies any lightheadedness or dizziness. Patient denies headaches, blurred vision, chest pains, shortness of breath, or weakness. Denies any side effects from medication and is content with current medication.   Hyperlipidemia Patient is coming in for recheck of his hyperlipidemia. The patient is currently taking simvastatin and fish oils. They deny any issues with myalgias or history of liver damage from it. They deny any focal numbness or weakness or chest pain.   GERD Patient is currently on omeprazole.  She denies any major symptoms or abdominal pain or belching or burping. She denies any blood in her stool or lightheadedness or dizziness.   Left shoulder pain/injury Patient has been having left shoulder pain/injury over the past few months off and on.  He says sometimes it will be worse and sometimes it will be better.  He cannot recall any specific trauma or event that injured but is just been having more issues with it over the past few months.  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  He says it hurts more with overhead range of motion and when he sleeps on that side at night.  He denies any numbness or weakness in that arm.  Relevant past medical, surgical, family and social history reviewed and updated as indicated. Interim medical history since our last visit reviewed. Allergies and medications reviewed and updated.  Review of Systems  Constitutional: Negative for chills and fever.  Eyes: Negative for visual disturbance.  Respiratory: Negative for shortness of breath and wheezing.   Cardiovascular: Negative for chest pain and leg swelling.  Gastrointestinal: Negative for abdominal pain, diarrhea, nausea and vomiting.  Musculoskeletal: Positive for arthralgias. Negative for back pain, gait problem, joint swelling and myalgias.  Skin: Negative for color change and rash.  All other systems reviewed and are negative.   Per HPI unless specifically indicated above   Allergies as of 08/31/2018   No Known Allergies     Medication List        Accurate as of 08/31/18  8:40 AM. Always use your most recent med list.          aspirin 81 MG tablet Take 81 mg by mouth daily.   FARXIGA 10 MG Tabs tablet Generic drug:  dapagliflozin propanediol TAKE 1 TABLET BY MOUTH ONCE DAILY   Fish Oil 600 MG Caps Take 3,000 mg by mouth daily.   glimepiride 4 MG tablet  Commonly known as:  AMARYL TAKE 1 TABLET BY MOUTH IN THE MORNING ONCE DAILY BEFORE BREAKFAST (NEEDS APPOINTMENT)   JANUMET 50-1000 MG tablet Generic drug:  sitaGLIPtin-metformin TAKE 1 TABLET BY MOUTH TWICE DAILY WITH A MEAL   lisinopril-hydrochlorothiazide 20-12.5 MG tablet Commonly known as:  PRINZIDE,ZESTORETIC TAKE 1 TABLET BY MOUTH ONCE DAILY   meloxicam 15 MG tablet Commonly known as:  MOBIC TAKE 1 TABLET BY MOUTH ONCE DAILY   multivitamin tablet Take 1 tablet by mouth daily.   omeprazole 40 MG capsule Commonly known as:   PRILOSEC TAKE ONE CAPSULE BY MOUTH ONCE DAILY AT BEDTIME   simvastatin 40 MG tablet Commonly known as:  ZOCOR Take 1 tablet (40 mg total) by mouth every evening.          Objective:    BP 111/71   Pulse 71   Temp 97.9 F (36.6 C) (Oral)   Ht 5' 5" (1.651 m)   Wt 198 lb 12.8 oz (90.2 kg)   BMI 33.08 kg/m   Wt Readings from Last 3 Encounters:  08/31/18 198 lb 12.8 oz (90.2 kg)  07/03/18 200 lb 6.4 oz (90.9 kg)  06/14/18 201 lb (91.2 kg)    Physical Exam  Constitutional: He is oriented to person, place, and time. He appears well-developed and well-nourished. No distress.  Eyes: Conjunctivae are normal. No scleral icterus.  Neck: Neck supple. No thyromegaly present.  Cardiovascular: Normal rate, regular rhythm, normal heart sounds and intact distal pulses.  No murmur heard. Pulmonary/Chest: Effort normal and breath sounds normal. No respiratory distress. He has no wheezes.  Musculoskeletal: Normal range of motion. He exhibits no edema.  Lymphadenopathy:    He has no cervical adenopathy.  Neurological: He is alert and oriented to person, place, and time. Coordination normal.  Skin: Skin is warm and dry. No rash noted. He is not diaphoretic.  Psychiatric: He has a normal mood and affect. His behavior is normal.  Nursing note and vitals reviewed.   Left subacromial shoulder injection: Consent form signed. Risk factors of bleeding and infection discussed with patient and patient is agreeable towards injection. Patient prepped with Betadine. Lateral approach towards injection used. Injected 80 mg of Depo-Medrol and 1 mL of 2% lidocaine. Patient tolerated procedure well and no side effects from noted. Minimal to no bleeding. Simple bandage applied after.     Assessment & Plan:   Problem List Items Addressed This Visit      Cardiovascular and Mediastinum   Hypertension associated with diabetes ([place])   Relevant Medications   lisinopril-hydrochlorothiazide (PRINZIDE,ZESTORETIC)  20-12.5 MG tablet   dapagliflozin propanediol (FARXIGA) 10 MG TABS tablet   sitaGLIPtin-metformin (JANUMET) 50-1000 MG tablet   glimepiride (AMARYL) 4 MG tablet     Digestive   GERD (gastroesophageal reflux disease)   Relevant Orders   CBC with Differential/Platelet     Endocrine   T2DM (type 2 diabetes mellitus) ([place]) - Primary   Relevant Medications   lisinopril-hydrochlorothiazide (PRINZIDE,ZESTORETIC) 20-12.5 MG tablet   dapagliflozin propanediol (FARXIGA) 10 MG TABS tablet   sitaGLIPtin-metformin (JANUMET) 50-1000 MG tablet   glimepiride (AMARYL) 4 MG tablet   Other Relevant Orders   Microalbumin / creatinine urine ratio   Bayer DCA Hb A1c Waived   CMP14+EGFR   Hyperlipidemia associated with type 2 diabetes mellitus ([facility])   Relevant Medications   lisinopril-hydrochlorothiazide (PRINZIDE,ZESTORETIC) 20-12.5 MG tablet   dapagliflozin propanediol (FARXIGA) 10 MG TABS tablet   sitaGLIPtin-metformin (JANUMET) 50-1000 MG tablet   glimepiride (AMARYL) 4  MG tablet   Other Relevant Orders   Lipid panel    Other Visit Diagnoses    Injury of left rotator cuff, initial encounter       Relevant Medications   methylPREDNISolone acetate (DEPO-MEDROL) injection 80 mg (Completed) (Start on 08/31/2018  9:15 AM)       Follow up plan: Return in about 3 months (around 11/30/2018), or if symptoms worsen or fail to improve, for Type 2 diabetes recheck.  Counseling provided for all of the vaccine components No orders of the defined types were placed in this encounter.   Caryl Pina, MD [facility] 08/31/2018, 8:40 AM

## 2018-09-01 LAB — MICROALBUMIN / CREATININE URINE RATIO
CREATININE, UR: 78.8 mg/dL
Microalb/Creat Ratio: 7.4 mg/g creat (ref 0.0–30.0)
Microalbumin, Urine: 5.8 ug/mL

## 2018-09-14 ENCOUNTER — Other Ambulatory Visit: Payer: Self-pay | Admitting: *Deleted

## 2018-09-14 MED ORDER — MELOXICAM 15 MG PO TABS: 15.0000 mg | ORAL_TABLET | Freq: Every day | ORAL | 0 refills | Status: DC

## 2018-11-30 ENCOUNTER — Ambulatory Visit (INDEPENDENT_AMBULATORY_CARE_PROVIDER_SITE_OTHER): Payer: Medicare HMO | Admitting: Family Medicine

## 2018-11-30 ENCOUNTER — Encounter: Payer: Self-pay | Admitting: Family Medicine

## 2018-11-30 VITALS — BP 103/64 | HR 73 | Temp 97.2°F | Ht 65.0 in | Wt 200.8 lb

## 2018-11-30 DIAGNOSIS — I10 Essential (primary) hypertension: Secondary | ICD-10-CM

## 2018-11-30 DIAGNOSIS — E1169 Type 2 diabetes mellitus with other specified complication: Secondary | ICD-10-CM | POA: Diagnosis not present

## 2018-11-30 DIAGNOSIS — K2100 Gastro-esophageal reflux disease with esophagitis, without bleeding: Secondary | ICD-10-CM

## 2018-11-30 DIAGNOSIS — I152 Hypertension secondary to endocrine disorders: Secondary | ICD-10-CM

## 2018-11-30 DIAGNOSIS — E785 Hyperlipidemia, unspecified: Secondary | ICD-10-CM

## 2018-11-30 DIAGNOSIS — E1159 Type 2 diabetes mellitus with other circulatory complications: Secondary | ICD-10-CM

## 2018-11-30 LAB — BAYER DCA HB A1C WAIVED: HB A1C: 7.8 % — AB (ref <7.0)

## 2018-11-30 MED ORDER — ALOGLIPTIN-METFORMIN HCL 12.5-1000 MG PO TABS: 1.0000 | ORAL_TABLET | Freq: Two times a day (BID) | ORAL | 3 refills | Status: DC

## 2018-11-30 MED ORDER — MELOXICAM 15 MG PO TABS: 15.0000 mg | ORAL_TABLET | Freq: Every day | ORAL | 1 refills | Status: DC

## 2018-11-30 NOTE — Progress Notes (Signed)
BP 103/64   Pulse 73   Temp (!) 97.2 F (36.2 C) (Oral)   Ht 5\' 5"  (1.651 m)   Wt 200 lb 12.8 oz (91.1 kg)   BMI 33.41 kg/m    Subjective:    Patient ID: Austin Ortiz, male    DOB: [birth year], 67 y.o.   MRN: [id number]  HPI: Austin Ortiz is a 67 y.o. male presenting on 11/30/2018 for Diabetes (3 month follow up) and Hypertension   HPI Type 2 diabetes mellitus Patient comes in today for recheck of his diabetes. Patient has been currently taking Janumet and [place] and glyburide. Patient is currently on an ACE inhibitor/ARB. Patient has seen an ophthalmologist this year. Patient denies any issues with their feet.   Hyperlipidemia Patient is coming in for recheck of his hyperlipidemia. The patient is currently taking omega-3's and simvastatin. They deny any issues with myalgias or history of liver damage from it. They deny any focal numbness or weakness or chest pain.   Hypertension Patient is currently on lisinopril-hydrochlorothiazide, and their blood pressure today is 103/64. Patient denies any lightheadedness or dizziness. Patient denies headaches, blurred vision, chest pains, shortness of breath, or weakness. Denies any side effects from medication and is content with current medication.   GERD Patient is currently on omeprazole.  She denies any major symptoms or abdominal pain or belching or burping. She denies any blood in her stool or lightheadedness or dizziness.   Relevant past medical, surgical, family and social history reviewed and updated as indicated. Interim medical history since our last visit reviewed. Allergies and medications reviewed and updated.  Review of Systems  Constitutional: Negative for chills and fever.  Eyes: Negative for visual disturbance.  Respiratory: Negative for shortness of breath and wheezing.   Cardiovascular: Negative for chest pain and leg swelling.  Musculoskeletal: Negative for back pain and gait problem.  Skin: Negative for rash.    Neurological: Negative for dizziness, weakness, light-headedness and numbness.  All other systems reviewed and are negative.   Per HPI unless specifically indicated above   Allergies as of 11/30/2018   No Known Allergies     Medication List        Accurate as of 11/30/18  8:44 AM. Always use your most recent med list.          Alogliptin-metFORMIN HCl 12.04-999 MG Tabs Take 1 tablet by mouth 2 (two) times daily.   aspirin 81 MG tablet Take 81 mg by mouth daily.   dapagliflozin propanediol 10 MG Tabs tablet Commonly known as:  FARXIGA Take 10 mg by mouth daily.   Fish Oil 600 MG Caps Take 3,000 mg by mouth daily.   glimepiride 4 MG tablet Commonly known as:  AMARYL Take 1 tablet (4 mg total) by mouth daily with breakfast.   lisinopril-hydrochlorothiazide 20-12.5 MG tablet Commonly known as:  PRINZIDE,ZESTORETIC Take 1 tablet by mouth daily.   meloxicam 15 MG tablet Commonly known as:  MOBIC Take 1 tablet (15 mg total) by mouth daily.   multivitamin tablet Take 1 tablet by mouth daily.   omeprazole 40 MG capsule Commonly known as:  PRILOSEC TAKE ONE CAPSULE BY MOUTH ONCE DAILY AT BEDTIME   simvastatin 40 MG tablet Commonly known as:  ZOCOR Take 1 tablet (40 mg total) by mouth every evening.   sitaGLIPtin-metformin 50-1000 MG tablet Commonly known as:  JANUMET TAKE 1 TABLET BY MOUTH TWICE DAILY WITH A MEAL          Objective:  BP 103/64   Pulse 73   Temp (!) 97.2 F (36.2 C) (Oral)   Ht 5\' 5"  (1.651 m)   Wt 200 lb 12.8 oz (91.1 kg)   BMI 33.41 kg/m   Wt Readings from Last 3 Encounters:  11/30/18 200 lb 12.8 oz (91.1 kg)  08/31/18 198 lb 12.8 oz (90.2 kg)  07/03/18 200 lb 6.4 oz (90.9 kg)    Physical Exam  Constitutional: He is oriented to person, place, and time. He appears well-developed and well-nourished. No distress.  Eyes: Conjunctivae are normal. No scleral icterus.  Neck: Neck supple. No thyromegaly present.  Cardiovascular:  Normal rate, regular rhythm, normal heart sounds and intact distal pulses.  No murmur heard. Pulmonary/Chest: Effort normal and breath sounds normal. No respiratory distress. He has no wheezes.  Musculoskeletal: Normal range of motion. He exhibits no edema.  Lymphadenopathy:    He has no cervical adenopathy.  Neurological: He is alert and oriented to person, place, and time. Coordination normal.  Skin: Skin is warm and dry. No rash noted. He is not diaphoretic.  Psychiatric: He has a normal mood and affect. His behavior is normal.  Nursing note and vitals reviewed.       Assessment & Plan:   Problem List Items Addressed This Visit      Cardiovascular and Mediastinum   Hypertension associated with diabetes ([place])   Relevant Medications   Alogliptin-metFORMIN HCl (KAZANO) 12.04-999 MG TABS     Digestive   GERD (gastroesophageal reflux disease)     Endocrine   T2DM (type 2 diabetes mellitus) ([place]) - Primary   Relevant Medications   Alogliptin-metFORMIN HCl (KAZANO) 12.04-999 MG TABS   Other Relevant Orders   Bayer DCA Hb A1c Waived   Hyperlipidemia associated with type 2 diabetes mellitus ([place])   Relevant Medications   Alogliptin-metFORMIN HCl (KAZANO) 12.04-999 MG TABS      Will try and send kazano instead of Janumet and if it is covered we will make the switch Follow up plan: Return in about 3 months (around 03/01/2019), or if symptoms worsen or fail to improve, for Diabetes and hypertension and cholesterol.  Counseling provided for all of the vaccine components Orders Placed This Encounter  Procedures  . Bayer [facility] Hb A1c [place], MD [facility] 11/30/2018, 8:44 AM

## 2019-02-06 DIAGNOSIS — R69 Illness, unspecified: Secondary | ICD-10-CM | POA: Diagnosis not present

## 2019-02-28 ENCOUNTER — Encounter: Payer: Self-pay | Admitting: Family Medicine

## 2019-02-28 ENCOUNTER — Ambulatory Visit (INDEPENDENT_AMBULATORY_CARE_PROVIDER_SITE_OTHER): Payer: Medicare HMO | Admitting: Family Medicine

## 2019-02-28 VITALS — BP 121/75 | HR 67 | Temp 97.3°F | Ht 65.0 in | Wt 200.4 lb

## 2019-02-28 DIAGNOSIS — K2100 Gastro-esophageal reflux disease with esophagitis, without bleeding: Secondary | ICD-10-CM

## 2019-02-28 DIAGNOSIS — I10 Essential (primary) hypertension: Secondary | ICD-10-CM

## 2019-02-28 DIAGNOSIS — E1169 Type 2 diabetes mellitus with other specified complication: Secondary | ICD-10-CM

## 2019-02-28 DIAGNOSIS — E785 Hyperlipidemia, unspecified: Secondary | ICD-10-CM

## 2019-02-28 DIAGNOSIS — E1159 Type 2 diabetes mellitus with other circulatory complications: Secondary | ICD-10-CM

## 2019-02-28 DIAGNOSIS — I152 Hypertension secondary to endocrine disorders: Secondary | ICD-10-CM

## 2019-02-28 LAB — CBC WITH DIFFERENTIAL/PLATELET
BASOS ABS: 0 10*3/uL (ref 0.0–0.2)
Basos: 0 %
EOS (ABSOLUTE): 0.2 10*3/uL (ref 0.0–0.4)
EOS: 3 %
HEMATOCRIT: 46.5 % (ref 37.5–51.0)
HEMOGLOBIN: 15.2 g/dL (ref 13.0–17.7)
IMMATURE GRANS (ABS): 0 10*3/uL (ref 0.0–0.1)
IMMATURE GRANULOCYTES: 0 %
LYMPHS: 35 %
Lymphocytes Absolute: 2.4 10*3/uL (ref 0.7–3.1)
MCH: 29.6 pg (ref 26.6–33.0)
MCHC: 32.7 g/dL (ref 31.5–35.7)
MCV: 91 fL (ref 79–97)
MONOCYTES: 11 %
Monocytes Absolute: 0.7 10*3/uL (ref 0.1–0.9)
NEUTROS PCT: 51 %
Neutrophils Absolute: 3.5 10*3/uL (ref 1.4–7.0)
Platelets: 281 10*3/uL (ref 150–450)
RBC: 5.14 x10E6/uL (ref 4.14–5.80)
RDW: 12.9 % (ref 11.6–15.4)
WBC: 6.8 10*3/uL (ref 3.4–10.8)

## 2019-02-28 LAB — LIPID PANEL
Chol/HDL Ratio: 4.1 ratio (ref 0.0–5.0)
Cholesterol, Total: 126 mg/dL (ref 100–199)
HDL: 31 mg/dL — ABNORMAL LOW (ref >39)
LDL Calculated: 53 mg/dL (ref 0–99)
Triglycerides: 212 mg/dL — ABNORMAL HIGH (ref 0–149)
VLDL Cholesterol Cal: 42 mg/dL — ABNORMAL HIGH (ref 5–40)

## 2019-02-28 LAB — CMP14+EGFR
ALBUMIN: 4.5 g/dL (ref 3.8–4.8)
ALK PHOS: 39 IU/L (ref 39–117)
ALT: 36 IU/L (ref 0–44)
AST: 24 IU/L (ref 0–40)
Albumin/Globulin Ratio: 1.8 (ref 1.2–2.2)
BUN/Creatinine Ratio: 25 — ABNORMAL HIGH (ref 10–24)
BUN: 21 mg/dL (ref 8–27)
Bilirubin Total: 0.3 mg/dL (ref 0.0–1.2)
CALCIUM: 9.5 mg/dL (ref 8.6–10.2)
CO2: 21 mmol/L (ref 20–29)
CREATININE: 0.85 mg/dL (ref 0.76–1.27)
Chloride: 102 mmol/L (ref 96–106)
GFR, EST AFRICAN AMERICAN: 105 mL/min/{1.73_m2} (ref >59)
GFR, EST NON AFRICAN AMERICAN: 91 mL/min/{1.73_m2} (ref >59)
GLOBULIN, TOTAL: 2.5 g/dL (ref 1.5–4.5)
GLUCOSE: 161 mg/dL — AB (ref 65–99)
Potassium: 4.8 mmol/L (ref 3.5–5.2)
Sodium: 140 mmol/L (ref 134–144)
TOTAL PROTEIN: 7 g/dL (ref 6.0–8.5)

## 2019-02-28 LAB — BAYER DCA HB A1C WAIVED: HB A1C (BAYER DCA - WAIVED): 8.1 % — ABNORMAL HIGH (ref <7.0)

## 2019-02-28 NOTE — Progress Notes (Signed)
BP 121/75   Pulse 67   Temp (!) 97.3 F (36.3 C) (Oral)   Ht _0  (1.651 m)   Wt 200 lb 6.4 oz (90.9 kg)   BMI 33.35 kg/m    Subjective:    Patient ID: Austin Ortiz, male    DOB: March 18, 1951, 68 y.o.   MRN: [id number]  HPI: Austin Ortiz is a 68 y.o. male presenting on 02/28/2019 for Diabetes (3 month followup); Hypertension; and Hyperlipidemia   HPI Hypertension Patient is currently on lisinopril-hydrochlorothiazide, and their blood pressure today is 121/75. Patient denies any lightheadedness or dizziness. Patient denies headaches, blurred vision, chest pains, shortness of breath, or weakness. Denies any side effects from medication and is content with current medication.   Type 2 diabetes mellitus Patient comes in today for recheck of his diabetes. Patient has been currently taking Janumet and [place] and glimepiride. Patient is currently on an ACE inhibitor/ARB. Patient has not seen an ophthalmologist this year. Patient denies any issues with their feet.   Hyperlipidemia Patient is coming in for recheck of his hyperlipidemia. The patient is currently taking simvastatin and fish oil. They deny any issues with myalgias or history of liver damage from it. They deny any focal numbness or weakness or chest pain.   GERD Patient is currently on omeprazole.  She denies any major symptoms or abdominal pain or belching or burping. She denies any blood in her stool or lightheadedness or dizziness.   Relevant past medical, surgical, family and social history reviewed and updated as indicated. Interim medical history since our last visit reviewed. Allergies and medications reviewed and updated.  Review of Systems  Constitutional: Negative for chills and fever.  Eyes: Negative for visual disturbance.  Respiratory: Negative for shortness of breath and wheezing.   Cardiovascular: Negative for chest pain and leg swelling.  Musculoskeletal: Negative for back pain and gait problem.  Skin:  Negative for rash.  Neurological: Negative for dizziness, weakness and light-headedness.  All other systems reviewed and are negative.   Per HPI unless specifically indicated above   Allergies as of 02/28/2019   No Known Allergies     Medication List       Accurate as of February 28, 2019  8:49 AM. Always use your most recent med list.        aspirin 81 MG tablet Take 81 mg by mouth daily.   dapagliflozin propanediol 10 MG Tabs tablet Commonly known as:  FARXIGA Take 10 mg by mouth daily.   Fish Oil 600 MG Caps Take 3,000 mg by mouth daily.   glimepiride 4 MG tablet Commonly known as:  AMARYL Take 1 tablet (4 mg total) by mouth daily with breakfast.   lisinopril-hydrochlorothiazide 20-12.5 MG tablet Commonly known as:  PRINZIDE,ZESTORETIC Take 1 tablet by mouth daily.   meloxicam 15 MG tablet Commonly known as:  MOBIC Take 1 tablet (15 mg total) by mouth daily.   multivitamin tablet Take 1 tablet by mouth daily.   omeprazole 40 MG capsule Commonly known as:  PRILOSEC TAKE ONE CAPSULE BY MOUTH ONCE DAILY AT BEDTIME   simvastatin 40 MG tablet Commonly known as:  ZOCOR Take 1 tablet (40 mg total) by mouth every evening.   sitaGLIPtin-metformin 50-1000 MG tablet Commonly known as:  JANUMET TAKE 1 TABLET BY MOUTH TWICE DAILY WITH A MEAL          Objective:    BP 121/75   Pulse 67   Temp (!) 97.3 F (36.3 C) (Oral)  Ht _0  (1.651 m)   Wt 200 lb 6.4 oz (90.9 kg)   BMI 33.35 kg/m   Wt Readings from Last 3 Encounters:  02/28/19 200 lb 6.4 oz (90.9 kg)  11/30/18 200 lb 12.8 oz (91.1 kg)  08/31/18 198 lb 12.8 oz (90.2 kg)    Physical Exam Vitals signs and nursing note reviewed.  Constitutional:      General: He is not in acute distress.    Appearance: He is well-developed. He is not diaphoretic.  Eyes:     General: No scleral icterus.    Conjunctiva/sclera: Conjunctivae normal.  Neck:     Musculoskeletal: Neck supple.     Thyroid: No thyromegaly.   Cardiovascular:     Rate and Rhythm: Normal rate and regular rhythm.     Heart sounds: Normal heart sounds. No murmur.  Pulmonary:     Effort: Pulmonary effort is normal. No respiratory distress.     Breath sounds: Normal breath sounds. No wheezing.  Lymphadenopathy:     Cervical: No cervical adenopathy.  Skin:    General: Skin is warm and dry.     Findings: No rash.  Neurological:     Mental Status: He is alert and oriented to person, place, and time.     Coordination: Coordination normal.  Psychiatric:        Behavior: Behavior normal.         Assessment & Plan:   Problem List Items Addressed This Visit      Cardiovascular and Mediastinum   Hypertension associated with diabetes ([place])     Digestive   GERD (gastroesophageal reflux disease)   Relevant Orders   CBC with Differential/Platelet     Endocrine   T2DM (type 2 diabetes mellitus) ([place]) - Primary   Relevant Orders   CMP14+EGFR   Bayer DCA Hb A1c Waived   Hyperlipidemia associated with type 2 diabetes mellitus ([place])   Relevant Orders   Lipid panel      Continue Janumet and Farxiga and glimepiride and lisinopril hydrochlorothiazide, will check labs today and continue to monitor.  Discussed with patient the possibly switching the Januvia for a GLP-1 or a dietitian and he will think about both. Follow up plan: Return in about 3 months (around 05/31/2019), or if symptoms worsen or fail to improve, for Diabetes and hypertension.  Counseling provided for all of the vaccine components No orders of the defined types were placed in this encounter.   Caryl Pina, MD [facility] 02/28/2019, 8:49 AM

## 2019-03-05 ENCOUNTER — Other Ambulatory Visit: Payer: Self-pay | Admitting: *Deleted

## 2019-03-05 DIAGNOSIS — K2100 Gastro-esophageal reflux disease with esophagitis, without bleeding: Secondary | ICD-10-CM

## 2019-03-05 MED ORDER — OMEPRAZOLE 40 MG PO CPDR: DELAYED_RELEASE_CAPSULE | ORAL | 3 refills | Status: DC

## 2019-06-03 ENCOUNTER — Ambulatory Visit: Payer: Medicare HMO | Admitting: Family Medicine

## 2019-06-18 ENCOUNTER — Other Ambulatory Visit: Payer: Self-pay | Admitting: Family Medicine

## 2019-06-19 ENCOUNTER — Other Ambulatory Visit: Payer: Self-pay

## 2019-06-20 ENCOUNTER — Ambulatory Visit (INDEPENDENT_AMBULATORY_CARE_PROVIDER_SITE_OTHER): Payer: Medicare HMO | Admitting: Family Medicine

## 2019-06-20 ENCOUNTER — Encounter: Payer: Self-pay | Admitting: Family Medicine

## 2019-06-20 VITALS — BP 114/73 | HR 71 | Temp 97.6°F | Ht 65.0 in | Wt 192.4 lb

## 2019-06-20 DIAGNOSIS — E785 Hyperlipidemia, unspecified: Secondary | ICD-10-CM | POA: Diagnosis not present

## 2019-06-20 DIAGNOSIS — K2100 Gastro-esophageal reflux disease with esophagitis, without bleeding: Secondary | ICD-10-CM

## 2019-06-20 DIAGNOSIS — E1159 Type 2 diabetes mellitus with other circulatory complications: Secondary | ICD-10-CM

## 2019-06-20 DIAGNOSIS — I10 Essential (primary) hypertension: Secondary | ICD-10-CM | POA: Diagnosis not present

## 2019-06-20 DIAGNOSIS — E1169 Type 2 diabetes mellitus with other specified complication: Secondary | ICD-10-CM

## 2019-06-20 DIAGNOSIS — I152 Hypertension secondary to endocrine disorders: Secondary | ICD-10-CM

## 2019-06-20 LAB — BAYER DCA HB A1C WAIVED: HB A1C (BAYER DCA - WAIVED): 6.9 % (ref <7.0)

## 2019-06-20 MED ORDER — JANUMET 50-1000 MG PO TABS: ORAL_TABLET | ORAL | 3 refills | Status: DC

## 2019-06-20 MED ORDER — GLIMEPIRIDE 4 MG PO TABS: 4.0000 mg | ORAL_TABLET | Freq: Every day | ORAL | 3 refills | Status: DC

## 2019-06-20 MED ORDER — FARXIGA 10 MG PO TABS: 10.0000 mg | ORAL_TABLET | Freq: Every day | ORAL | 3 refills | Status: DC

## 2019-06-20 MED ORDER — LISINOPRIL-HYDROCHLOROTHIAZIDE 20-12.5 MG PO TABS: 1.0000 | ORAL_TABLET | Freq: Every day | ORAL | 3 refills | Status: DC

## 2019-06-20 MED ORDER — SIMVASTATIN 40 MG PO TABS: 40.0000 mg | ORAL_TABLET | Freq: Every evening | ORAL | 3 refills | Status: DC

## 2019-06-20 MED ORDER — OMEPRAZOLE 40 MG PO CPDR: DELAYED_RELEASE_CAPSULE | ORAL | 3 refills | Status: DC

## 2019-06-20 NOTE — Progress Notes (Signed)
BP 114/73   Pulse 71   Temp 97.6 F (36.4 C) (Oral)   Ht 5\' 5"  (1.651 m)   Wt 192 lb 6.4 oz (87.3 kg)   BMI 32.02 kg/m    Subjective:   Patient ID: Austin Ortiz, male    DOB: 03-20-1951, 68 y.o.   MRN: [id number]  HPI: Yonathan Perrow is a 68 y.o. male presenting on 06/20/2019 for Diabetes (3 month follow up- Patient states that he is still having left sholder pain.)   HPI Type 2 diabetes mellitus Patient comes in today for recheck of his diabetes. Patient has been currently taking Janumet and glimepiride and [place]. Patient is currently on an ACE inhibitor/ARB. Patient has not seen an ophthalmologist this year. Patient denies any issues with their feet.   Patient has continued left shoulder pain arthritis that bothers him sometimes he just wanted to mention it today is not the point where he wants to do anything further.  He has seen an orthopedic for before.  Hypertension Patient is currently on lisinopril and hydrochlorothiazide, and their blood pressure today is 114/73. Patient denies any lightheadedness or dizziness. Patient denies headaches, blurred vision, chest pains, shortness of breath, or weakness. Denies any side effects from medication and is content with current medication.   Hyperlipidemia Patient is coming in for recheck of his hyperlipidemia. The patient is currently taking fish oils and simvastatin. They deny any issues with myalgias or history of liver damage from it. They deny any focal numbness or weakness or chest pain.   GERD Patient is currently on omeprazole.  She denies any major symptoms or abdominal pain or belching or burping. She denies any blood in her stool or lightheadedness or dizziness.   Relevant past medical, surgical, family and social history reviewed and updated as indicated. Interim medical history since our last visit reviewed. Allergies and medications reviewed and updated.  Review of Systems  Constitutional: Negative for chills and  fever.  Eyes: Negative for visual disturbance.  Respiratory: Negative for shortness of breath and wheezing.   Cardiovascular: Negative for chest pain and leg swelling.  Musculoskeletal: Negative for back pain and gait problem.  Skin: Negative for rash.  Neurological: Negative for dizziness, weakness and light-headedness.  All other systems reviewed and are negative.   Per HPI unless specifically indicated above   Allergies as of 06/20/2019   No Known Allergies     Medication List       Accurate as of June 20, 2019  8:04 AM. If you have any questions, ask your nurse or doctor.        aspirin 81 MG tablet Take 81 mg by mouth daily.   dapagliflozin propanediol 10 MG Tabs tablet Commonly known as: Farxiga Take 10 mg by mouth daily.   Fish Oil 600 MG Caps Take 3,000 mg by mouth daily.   glimepiride 4 MG tablet Commonly known as: AMARYL Take 1 tablet (4 mg total) by mouth daily with breakfast.   lisinopril-hydrochlorothiazide 20-12.5 MG tablet Commonly known as: ZESTORETIC Take 1 tablet by mouth daily.   meloxicam 15 MG tablet Commonly known as: MOBIC Take 1 tablet by mouth once daily   multivitamin tablet Take 1 tablet by mouth daily.   omeprazole 40 MG capsule Commonly known as: PRILOSEC TAKE ONE CAPSULE BY MOUTH ONCE DAILY AT BEDTIME   simvastatin 40 MG tablet Commonly known as: ZOCOR Take 1 tablet (40 mg total) by mouth every evening.   sitaGLIPtin-metformin 50-1000 MG tablet Commonly known  as: Janumet TAKE 1 TABLET BY MOUTH TWICE DAILY WITH A MEAL        Objective:   BP 114/73   Pulse 71   Temp 97.6 F (36.4 C) (Oral)   Ht 5\' 5"  (1.651 m)   Wt 192 lb 6.4 oz (87.3 kg)   BMI 32.02 kg/m   Wt Readings from Last 3 Encounters:  06/20/19 192 lb 6.4 oz (87.3 kg)  02/28/19 200 lb 6.4 oz (90.9 kg)  11/30/18 200 lb 12.8 oz (91.1 kg)    Physical Exam Vitals signs and nursing note reviewed.  Constitutional:      General: He is not in acute distress.     Appearance: He is well-developed. He is not diaphoretic.  Eyes:     General: No scleral icterus.    Conjunctiva/sclera: Conjunctivae normal.  Neck:     Musculoskeletal: Neck supple.     Thyroid: No thyromegaly.  Cardiovascular:     Rate and Rhythm: Normal rate and regular rhythm.     Heart sounds: Normal heart sounds. No murmur.  Pulmonary:     Effort: Pulmonary effort is normal. No respiratory distress.     Breath sounds: Normal breath sounds. No wheezing.  Musculoskeletal: Normal range of motion.  Lymphadenopathy:     Cervical: No cervical adenopathy.  Skin:    General: Skin is warm and dry.     Findings: No rash.  Neurological:     Mental Status: He is alert and oriented to person, place, and time.     Coordination: Coordination normal.  Psychiatric:        Behavior: Behavior normal.     Assessment & Plan:   Problem List Items Addressed This Visit      Cardiovascular and Mediastinum   Hypertension associated with diabetes ([place]) - Primary   Relevant Medications   simvastatin (ZOCOR) 40 MG tablet   dapagliflozin propanediol (FARXIGA) 10 MG TABS tablet   glimepiride (AMARYL) 4 MG tablet   lisinopril-hydrochlorothiazide (ZESTORETIC) 20-12.5 MG tablet   sitaGLIPtin-metformin (JANUMET) 50-1000 MG tablet     Digestive   GERD (gastroesophageal reflux disease)   Relevant Medications   omeprazole (PRILOSEC) 40 MG capsule     Endocrine   T2DM (type 2 diabetes mellitus) ([facility])   Relevant Medications   simvastatin (ZOCOR) 40 MG tablet   dapagliflozin propanediol (FARXIGA) 10 MG TABS tablet   glimepiride (AMARYL) 4 MG tablet   lisinopril-hydrochlorothiazide (ZESTORETIC) 20-12.5 MG tablet   sitaGLIPtin-metformin (JANUMET) 50-1000 MG tablet   Other Relevant Orders   Bayer DCA Hb A1c Waived   Hyperlipidemia associated with type 2 diabetes mellitus ([facility])   Relevant Medications   simvastatin (ZOCOR) 40 MG tablet   dapagliflozin propanediol (FARXIGA) 10 MG TABS tablet    glimepiride (AMARYL) 4 MG tablet   lisinopril-hydrochlorothiazide (ZESTORETIC) 20-12.5 MG tablet   sitaGLIPtin-metformin (JANUMET) 50-1000 MG tablet    Other Visit Diagnoses    Hyperlipidemia       Relevant Medications   simvastatin (ZOCOR) 40 MG tablet   lisinopril-hydrochlorothiazide (ZESTORETIC) 20-12.5 MG tablet      Continue current medication, will check A1c.  He says it sounds like his blood sugars are doing better at home and will see where he is doing.  The rest of blood work was done last time.  Follow up plan: Return in about 3 months (around 09/20/2019), or if symptoms worsen or fail to improve, for Diabetes recheck.  Counseling provided for all of the vaccine components No orders of  the defined types were placed in this encounter.   Caryl Pina, MD [facility] 06/20/2019, 8:04 AM

## 2019-07-05 ENCOUNTER — Other Ambulatory Visit: Payer: Self-pay

## 2019-07-05 ENCOUNTER — Ambulatory Visit (INDEPENDENT_AMBULATORY_CARE_PROVIDER_SITE_OTHER): Payer: Medicare HMO | Admitting: *Deleted

## 2019-07-05 ENCOUNTER — Encounter: Payer: Self-pay | Admitting: *Deleted

## 2019-07-05 DIAGNOSIS — Z0000 Encounter for general adult medical examination without abnormal findings: Secondary | ICD-10-CM | POA: Diagnosis not present

## 2019-07-05 NOTE — Progress Notes (Addendum)
MEDICARE ANNUAL WELLNESS VISIT  07/05/2019  Telephone Visit Disclaimer This Medicare AWV was conducted by telephone due to national recommendations for restrictions regarding the COVID-19 Pandemic (e.g. social distancing).  I verified, using two identifiers, that I am speaking with Austin Ortiz or their authorized healthcare agent. I discussed the limitations, risks, security, and privacy concerns of performing an evaluation and management service by telephone and the potential availability of an in-person appointment in the future. The patient expressed understanding and agreed to proceed.   Subjective:  Austin Ortiz is a 68 y.o. male patient of Dettinger, Fransisca Kaufmann, MD who had a Medicare Annual Wellness Visit today via telephone. 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 he has 3 children. he reports that he is socially active and does interact with friends/family regularly. he is moderately physically active and enjoys playing cards.  Patient Care Team: Dettinger, Fransisca Kaufmann, MD as PCP - General (Family Medicine)  Advanced Directives 07/05/2019 07/03/2018 08/04/2014  Does Patient Have a Medical Advance Directive? No No Patient has advance directive, copy not in chart  Type of Advance Directive - - Living will  Would patient like information on creating a medical advance directive? No - Patient declined No - Patient declined -  Pre-existing out of facility DNR order (yellow form or pink MOST form) - - No    Hospital Utilization Over the Past 12 Months: # of hospitalizations or ER visits: 0 # of surgeries: 0  Review of Systems    Patient reports that his overall health is unchanged compared to last year.  Patient Reported Readings (BP, Pulse, CBG, Weight, etc) none  Review of Systems: No complaints  All other systems negative.  Pain Assessment Pain : No/denies pain     Current Medications & Allergies (verified) Allergies as of 07/05/2019   No  Known Allergies     Medication List       Accurate as of July 05, 2019  8:46 AM. If you have any questions, ask your nurse or doctor.        aspirin 81 MG tablet Take 81 mg by mouth daily.   Farxiga 10 MG Tabs tablet Generic drug: dapagliflozin propanediol Take 10 mg by mouth daily.   Fish Oil 600 MG Caps Take 3,000 mg by mouth daily.   glimepiride 4 MG tablet Commonly known as: AMARYL Take 1 tablet (4 mg total) by mouth daily with breakfast.   Janumet 50-1000 MG tablet Generic drug: sitaGLIPtin-metformin TAKE 1 TABLET BY MOUTH TWICE DAILY WITH A MEAL   lisinopril-hydrochlorothiazide 20-12.5 MG tablet Commonly known as: ZESTORETIC Take 1 tablet by mouth daily.   meloxicam 15 MG tablet Commonly known as: MOBIC Take 1 tablet by mouth once daily   multivitamin tablet Take 1 tablet by mouth daily.   omeprazole 40 MG capsule Commonly known as: PRILOSEC TAKE ONE CAPSULE BY MOUTH ONCE DAILY AT BEDTIME   simvastatin 40 MG tablet Commonly known as: ZOCOR Take 1 tablet (40 mg total) by mouth every evening.       History (reviewed): Past Medical History:  Diagnosis Date  . Diabetes mellitus   . Esophageal stricture   . Foreign body in subcutaneous tissue    metal off of a machine, epigastric abd. area  . Hyperlipidemia   . Hypertension   . Kidney stones   . Obstructive sleep apnea 05/28/2013   Past Surgical History:  Procedure Laterality Date  . ESOPHAGUS SURGERY  2010  had esophagus stretched  . KIDNEY STONE SURGERY  1979  . neg hx     Family History  Problem Relation Age of Onset  . Heart disease Mother   . Hypertension Mother   . Diabetes type II Paternal Aunt        x2  . Nephrolithiasis Maternal Aunt   . Breast cancer Maternal Aunt   . Heart attack Father        x 2  . Diabetes Maternal Aunt   . Diabetes Cousin        mat. side  . Colon cancer Neg Hx    Social History   Socioeconomic History  . Marital status: Married    Spouse name:  [name]  . Number of children: 3  . Years of education: 45  . Highest education level: High school graduate  Occupational History  . Occupation: self employed- farmer  Social Needs  . Financial resource strain: Not hard at all  . Food insecurity    Worry: Never true    Inability: Never true  . Transportation needs    Medical: No    Non-medical: No  Tobacco Use  . Smoking status: Never Smoker  . Smokeless tobacco: Never Used  Substance and Sexual Activity  . Alcohol use: No  . Drug use: No  . Sexual activity: Yes  Lifestyle  . Physical activity    Days per week: 5 days    Minutes per session: 60 min  . Stress: Not at all  Relationships  . Social connections    Talks on phone: More than three times a week    Gets together: Three times a week    Attends religious service: More than 4 times per year    Active member of club or organization: Yes    Attends meetings of clubs or organizations: More than 4 times per year    Relationship status: Married  Other Topics Concern  . Not on file  Social History Narrative  . Not on file    Activities of Daily Living In your present state of health, do you have any difficulty performing the following activities: 07/05/2019  Hearing? N  Vision? N  Difficulty concentrating or making decisions? N  Walking or climbing stairs? N  Dressing or bathing? N  Doing errands, shopping? N  Preparing Food and eating ? N  Using the Toilet? N  In the past six months, have you accidently leaked urine? N  Do you have problems with loss of bowel control? N  Managing your Medications? N  Managing your Finances? N  Housekeeping or managing your Housekeeping? N  Some recent data might be hidden    Patient Literacy How often do you need to have someone help you when you read instructions, pamphlets, or other written materials from your doctor or pharmacy?: 1 - Never What is the last grade level you completed in school?: 12th grade  Exercise  Current Exercise Habits: The patient has a physically strenuous job, but has no regular exercise apart from work., Exercise limited by: None identified  Diet Patient reports consuming 3 meals a day and 1 snack(s) a day Patient reports that his primary diet is: Regular Patient reports that she does have regular access to food.   Depression Screen PHQ 2/9 Scores 07/05/2019 06/20/2019 02/28/2019 11/30/2018 08/31/2018 07/03/2018 06/14/2018  PHQ - 2 Score 0 0 0 0 0 0 0     Fall Risk Fall Risk  07/05/2019 11/30/2018 08/31/2018 07/03/2018 06/14/2018  Falls in the past year? 0 0 No No No     Objective:  Austin Ortiz seemed alert and oriented and he participated appropriately during our telephone visit.  Blood Pressure Weight BMI  BP Readings from Last 3 Encounters:  06/20/19 114/73  02/28/19 121/75  11/30/18 103/64   Wt Readings from Last 3 Encounters:  06/20/19 192 lb 6.4 oz (87.3 kg)  02/28/19 200 lb 6.4 oz (90.9 kg)  11/30/18 200 lb 12.8 oz (91.1 kg)   BMI Readings from Last 1 Encounters:  06/20/19 32.02 kg/m    *Unable to obtain current vital signs, weight, and BMI due to telephone visit type  Hearing/Vision  . Doc did not seem to have difficulty with hearing/understanding during the telephone conversation . Reports that he has not had a formal eye exam by an eye care professional within the past year . Reports that he has not had a formal hearing evaluation within the past year *Unable to fully assess hearing and vision during telephone visit type  Cognitive Function: 6CIT Screen 07/05/2019  What Year? 0 points  What month? 0 points  What time? 0 points  Count back from 20 0 points  Months in reverse 0 points  Repeat phrase 2 points  Total Score 2   (Normal:0-7, Significant for Dysfunction: >8)  Normal Cognitive Function Screening: Yes   Immunization & Health Maintenance Record Immunization History  Administered Date(s) Administered  . Influenza, High Dose Seasonal PF  11/27/2017  . Influenza,inj,Quad PF,6+ Mos 09/25/2013, 10/01/2016  . Influenza-Unspecified 09/26/2015, 11/30/2018  . Pneumococcal Conjugate-13 12/13/2013  . Pneumococcal Polysaccharide-23 07/03/2018  . Tdap 03/29/2012  . Zoster 07/15/2015    Health Maintenance  Topic Date Due  . Hepatitis C Screening  11/04/1951  . COLON CANCER SCREENING ANNUAL FOBT  10/12/2002  . OPHTHALMOLOGY EXAM  03/02/2015  . FOOT EXAM  06/01/2019  . INFLUENZA VACCINE  07/27/2019  . HEMOGLOBIN A1C  12/20/2019  . COLONOSCOPY  10/14/2020  . TETANUS/TDAP  04/08/2022  . PNA vac Low Risk Adult  Completed       Assessment  This is a routine wellness examination for Austin Ortiz.  Health Maintenance: Due or Overdue Health Maintenance Due  Topic Date Due  . Hepatitis C Screening  1951-03-12  . COLON CANCER SCREENING ANNUAL FOBT  10/12/2002  . OPHTHALMOLOGY EXAM  03/02/2015  . FOOT EXAM  06/01/2019    Austin Ortiz does not need a referral for Community Assistance: Care Management:   no Social Work:    no Prescription Assistance:  no Nutrition/Diabetes Education:  no   Plan:  Personalized Goals Goals Addressed            This Visit's Progress   . DIET - INCREASE WATER INTAKE       Try to drink 6-8 glasses of water daily      Personalized Health Maintenance & Screening Recommendations  Colorectal cancer screening Diabetes screening  Lung Cancer Screening Recommended: no (Low Dose CT Chest recommended if Age 46-80 years, 30 pack-year currently smoking OR have quit w/in past 15 years) Hepatitis C Screening recommended: yes HIV Screening recommended: no  Advanced Directives: Written information was not prepared per patient's request.  Referrals & Orders No orders of the defined types were placed in this encounter.   Follow-up Plan . Follow-up with Dettinger, Fransisca Kaufmann, MD as planned . Schedule your Diabetic Eye Exam . At your next visit with your PCP get the FOBT card and also do the  Hep  C screening   I have personally reviewed and noted the following in the patient's chart:   . Medical and social history . Use of alcohol, tobacco or illicit drugs  . Current medications and supplements . Functional ability and status . Nutritional status . Physical activity . Advanced directives . List of other physicians . Hospitalizations, surgeries, and ER visits in previous 12 months . Vitals . Screenings to include cognitive, depression, and falls . Referrals and appointments  In addition, I have reviewed and discussed with Austin Ortiz certain preventive protocols, quality metrics, and best practice recommendations. A written personalized care plan for preventive services as well as general preventive health recommendations is available and can be mailed to the patient at his request.      Marylin Crosby, LPN  9/41/7408     I have reviewed and agree with the above  documentation.   Evelina Dun, FNP

## 2019-07-05 NOTE — Patient Instructions (Signed)
Preventive Care 75 Years and Older, Male Preventive care refers to lifestyle choices and visits with your health care provider that can promote health and wellness. This includes:  A yearly physical exam. This is also called an annual well check.  Regular dental and eye exams.  Immunizations.  Screening for certain conditions.  Healthy lifestyle choices, such as diet and exercise. What can I expect for my preventive care visit? Physical exam Your health care provider will check:  Height and weight. These may be used to calculate body mass index (BMI), which is a measurement that tells if you are at a healthy weight.  Heart rate and blood pressure.  Your skin for abnormal spots. Counseling Your health care provider may ask you questions about:  Alcohol, tobacco, and drug use.  Emotional well-being.  Home and relationship well-being.  Sexual activity.  Eating habits.  History of falls.  Memory and ability to understand (cognition).  Work and work Statistician. What immunizations do I need?  Influenza (flu) vaccine  This is recommended every year. Tetanus, diphtheria, and pertussis (Tdap) vaccine  You may need a Td booster every 10 years. Varicella (chickenpox) vaccine  You may need this vaccine if you have not already been vaccinated. Zoster (shingles) vaccine  You may need this after age 50. Pneumococcal conjugate (PCV13) vaccine  One dose is recommended after age 24. Pneumococcal polysaccharide (PPSV23) vaccine  One dose is recommended after age 33. Measles, mumps, and rubella (MMR) vaccine  You may need at least one dose of MMR if you were born in 1957 or later. You may also need a second dose. Meningococcal conjugate (MenACWY) vaccine  You may need this if you have certain conditions. Hepatitis A vaccine  You may need this if you have certain conditions or if you travel or work in places where you may be exposed to hepatitis A. Hepatitis B vaccine   You may need this if you have certain conditions or if you travel or work in places where you may be exposed to hepatitis B. Haemophilus influenzae type b (Hib) vaccine  You may need this if you have certain conditions. You may receive vaccines as individual doses or as more than one vaccine together in one shot (combination vaccines). Talk with your health care provider about the risks and benefits of combination vaccines. What tests do I need? Blood tests  Lipid and cholesterol levels. These may be checked every 5 years, or more frequently depending on your overall health.  Hepatitis C test.  Hepatitis B test. Screening  Lung cancer screening. You may have this screening every year starting at age 74 if you have a 30-pack-year history of smoking and currently smoke or have quit within the past 15 years.  Colorectal cancer screening. All adults should have this screening starting at age 57 and continuing until age 54. Your health care provider may recommend screening at age 47 if you are at increased risk. You will have tests every 1-10 years, depending on your results and the type of screening test.  Prostate cancer screening. Recommendations will vary depending on your family history and other risks.  Diabetes screening. This is done by checking your blood sugar (glucose) after you have not eaten for a while (fasting). You may have this done every 1-3 years.  Abdominal aortic aneurysm (AAA) screening. You may need this if you are a current or former smoker.  Sexually transmitted disease (STD) testing. Follow these instructions at home: Eating and drinking  Eat  a diet that includes fresh fruits and vegetables, whole grains, lean protein, and low-fat dairy products. Limit your intake of foods with high amounts of sugar, saturated fats, and salt.  Take vitamin and mineral supplements as recommended by your health care provider.  Do not drink alcohol if your health care provider  tells you not to drink.  If you drink alcohol: ? Limit how much you have to 0-2 drinks a day. ? Be aware of how much alcohol is in your drink. In the [place] one drink equals one 12 oz bottle of beer (355 mL), one 5 oz glass of wine (148 mL), or one 1 oz glass of hard liquor (44 mL). Lifestyle  Take daily care of your teeth and gums.  Stay active. Exercise for at least 30 minutes on 5 or more days each week.  Do not use any products that contain nicotine or tobacco, such as cigarettes, e-cigarettes, and chewing tobacco. If you need help quitting, ask your health care provider.  If you are sexually active, practice safe sex. Use a condom or other form of protection to prevent STIs (sexually transmitted infections).  Talk with your health care provider about taking a low-dose aspirin or statin. What's next?  Visit your health care provider once a year for a well check visit.  Ask your health care provider how often you should have your eyes and teeth checked.  Stay up to date on all vaccines. This information is not intended to replace advice given to you by your health care provider. Make sure you discuss any questions you have with your health care provider. Document Released: 01/08/2016 Document Revised: 12/06/2018 Document Reviewed: 12/06/2018 Elsevier Patient Education  2020 Elsevier Inc.  

## 2019-09-17 ENCOUNTER — Other Ambulatory Visit: Payer: Self-pay | Admitting: Family Medicine

## 2019-09-20 ENCOUNTER — Ambulatory Visit: Payer: Medicare HMO | Admitting: Family Medicine

## 2019-09-22 ENCOUNTER — Other Ambulatory Visit: Payer: Self-pay | Admitting: Family Medicine

## 2019-09-23 ENCOUNTER — Other Ambulatory Visit: Payer: Self-pay | Admitting: Family Medicine

## 2019-09-23 MED ORDER — MELOXICAM 15 MG PO TABS: 15.0000 mg | ORAL_TABLET | Freq: Every day | ORAL | 0 refills | Status: DC

## 2019-09-23 NOTE — Telephone Encounter (Signed)
rf sent 

## 2019-10-18 ENCOUNTER — Other Ambulatory Visit: Payer: Self-pay

## 2019-10-21 ENCOUNTER — Ambulatory Visit (INDEPENDENT_AMBULATORY_CARE_PROVIDER_SITE_OTHER): Payer: Medicare HMO

## 2019-10-21 ENCOUNTER — Other Ambulatory Visit: Payer: Self-pay

## 2019-10-21 DIAGNOSIS — Z23 Encounter for immunization: Secondary | ICD-10-CM

## 2019-10-21 DIAGNOSIS — E785 Hyperlipidemia, unspecified: Secondary | ICD-10-CM

## 2019-10-21 DIAGNOSIS — E1159 Type 2 diabetes mellitus with other circulatory complications: Secondary | ICD-10-CM

## 2019-10-21 DIAGNOSIS — I152 Hypertension secondary to endocrine disorders: Secondary | ICD-10-CM

## 2019-10-21 DIAGNOSIS — E1169 Type 2 diabetes mellitus with other specified complication: Secondary | ICD-10-CM | POA: Diagnosis not present

## 2019-10-21 DIAGNOSIS — I10 Essential (primary) hypertension: Secondary | ICD-10-CM | POA: Diagnosis not present

## 2019-10-21 LAB — BAYER DCA HB A1C WAIVED: HB A1C (BAYER DCA - WAIVED): 6.8 %

## 2019-10-21 NOTE — Addendum Note (Signed)
Addended by: Nigel Berthold C on: 10/21/2019 11:54 AM   Modules accepted: Orders

## 2019-10-22 LAB — CBC WITH DIFFERENTIAL/PLATELET
Basophils Absolute: 0 10*3/uL (ref 0.0–0.2)
Basos: 1 %
EOS (ABSOLUTE): 0.5 10*3/uL — ABNORMAL HIGH (ref 0.0–0.4)
Eos: 7 %
Hematocrit: 45.7 % (ref 37.5–51.0)
Hemoglobin: 15.1 g/dL (ref 13.0–17.7)
Immature Grans (Abs): 0 10*3/uL (ref 0.0–0.1)
Immature Granulocytes: 0 %
Lymphocytes Absolute: 2.2 10*3/uL (ref 0.7–3.1)
Lymphs: 34 %
MCH: 30.3 pg (ref 26.6–33.0)
MCHC: 33 g/dL (ref 31.5–35.7)
MCV: 92 fL (ref 79–97)
Monocytes Absolute: 0.7 10*3/uL (ref 0.1–0.9)
Monocytes: 11 %
Neutrophils Absolute: 3.1 10*3/uL (ref 1.4–7.0)
Neutrophils: 47 %
Platelets: 285 10*3/uL (ref 150–450)
RBC: 4.98 x10E6/uL (ref 4.14–5.80)
RDW: 12.9 % (ref 11.6–15.4)
WBC: 6.5 10*3/uL (ref 3.4–10.8)

## 2019-10-22 LAB — CMP14+EGFR
ALT: 24 IU/L (ref 0–44)
AST: 17 IU/L (ref 0–40)
Albumin/Globulin Ratio: 1.7 (ref 1.2–2.2)
Albumin: 4.3 g/dL (ref 3.8–4.8)
Alkaline Phosphatase: 41 IU/L (ref 39–117)
BUN/Creatinine Ratio: 26 — ABNORMAL HIGH (ref 10–24)
BUN: 20 mg/dL (ref 8–27)
Bilirubin Total: 0.2 mg/dL (ref 0.0–1.2)
CO2: 23 mmol/L (ref 20–29)
Calcium: 8.9 mg/dL (ref 8.6–10.2)
Chloride: 102 mmol/L (ref 96–106)
Creatinine, Ser: 0.77 mg/dL (ref 0.76–1.27)
GFR calc Af Amer: 109 mL/min/{1.73_m2} (ref >59)
GFR calc non Af Amer: 94 mL/min/{1.73_m2} (ref >59)
Globulin, Total: 2.6 g/dL (ref 1.5–4.5)
Glucose: 154 mg/dL — ABNORMAL HIGH (ref 65–99)
Potassium: 4.8 mmol/L (ref 3.5–5.2)
Sodium: 140 mmol/L (ref 134–144)
Total Protein: 6.9 g/dL (ref 6.0–8.5)

## 2019-10-22 LAB — LIPID PANEL
Chol/HDL Ratio: 3.6 ratio (ref 0.0–5.0)
Cholesterol, Total: 119 mg/dL (ref 100–199)
HDL: 33 mg/dL — ABNORMAL LOW (ref >39)
LDL Chol Calc (NIH): 53 mg/dL (ref 0–99)
Triglycerides: 205 mg/dL — ABNORMAL HIGH (ref 0–149)
VLDL Cholesterol Cal: 33 mg/dL (ref 5–40)

## 2019-10-25 ENCOUNTER — Other Ambulatory Visit: Payer: Self-pay

## 2019-10-28 ENCOUNTER — Encounter: Payer: Self-pay | Admitting: Family Medicine

## 2019-10-28 ENCOUNTER — Ambulatory Visit (INDEPENDENT_AMBULATORY_CARE_PROVIDER_SITE_OTHER): Payer: Medicare HMO | Admitting: Family Medicine

## 2019-10-28 VITALS — BP 125/69 | HR 82 | Temp 98.0°F | Ht 65.0 in | Wt 196.0 lb

## 2019-10-28 DIAGNOSIS — E1169 Type 2 diabetes mellitus with other specified complication: Secondary | ICD-10-CM

## 2019-10-28 DIAGNOSIS — E1159 Type 2 diabetes mellitus with other circulatory complications: Secondary | ICD-10-CM | POA: Diagnosis not present

## 2019-10-28 DIAGNOSIS — I10 Essential (primary) hypertension: Secondary | ICD-10-CM

## 2019-10-28 DIAGNOSIS — I152 Hypertension secondary to endocrine disorders: Secondary | ICD-10-CM

## 2019-10-28 DIAGNOSIS — L819 Disorder of pigmentation, unspecified: Secondary | ICD-10-CM

## 2019-10-28 DIAGNOSIS — K219 Gastro-esophageal reflux disease without esophagitis: Secondary | ICD-10-CM | POA: Diagnosis not present

## 2019-10-28 DIAGNOSIS — Z1159 Encounter for screening for other viral diseases: Secondary | ICD-10-CM

## 2019-10-28 DIAGNOSIS — E785 Hyperlipidemia, unspecified: Secondary | ICD-10-CM | POA: Diagnosis not present

## 2019-10-28 NOTE — Progress Notes (Signed)
BP 125/69   Pulse 82   Temp 98 F (36.7 C) (Temporal)   Ht _0  (1.651 m)   Wt 196 lb (88.9 kg)   SpO2 97%   BMI 32.62 kg/m    Subjective:   Patient ID: Austin Ortiz, male    DOB: 1951/10/21, 68 y.o.   MRN: [id number]  HPI: Austin Ortiz is a 68 y.o. male presenting on 10/28/2019 for Diabetes (3 month follow up) and Hypertension   HPI Type 2 diabetes mellitus Patient comes in today for recheck of his diabetes. Patient has been currently taking [place] and glimepiride and Janumet. Patient is currently on an ACE inhibitor/ARB. Patient has not seen an ophthalmologist this year. Patient denies any issues with their feet.   Hypertension Patient is currently on lisinopril hydrochlorothiazide, and their blood pressure today is 125/69. Patient denies any lightheadedness or dizziness. Patient denies headaches, blurred vision, chest pains, shortness of breath, or weakness. Denies any side effects from medication and is content with current medication.   Hyperlipidemia Patient is coming in for recheck of his hyperlipidemia. The patient is currently taking omega-3 and simvastatin. They deny any issues with myalgias or history of liver damage from it. They deny any focal numbness or weakness or chest pain.   GERD Patient is currently on omeprazole.  She denies any major symptoms or abdominal pain or belching or burping. She denies any blood in her stool or lightheadedness or dizziness.   Patient has a rash in his groin that has been there for a least a few months maybe longer.  He thought it was jock itch because he did have some irritation but now the rash is still there but mostly irritation is gone.  He has been using a jock itch spray which has been helping.  The rash is still there despite that though.  It is in his right groin  Relevant past medical, surgical, family and social history reviewed and updated as indicated. Interim medical history since our last visit reviewed. Allergies  and medications reviewed and updated.  Review of Systems  Constitutional: Negative for chills and fever.  Respiratory: Negative for shortness of breath and wheezing.   Cardiovascular: Negative for chest pain and leg swelling.  Musculoskeletal: Negative for back pain and gait problem.  Skin: Positive for rash.  Neurological: Negative for dizziness and weakness.  All other systems reviewed and are negative.   Per HPI unless specifically indicated above   Allergies as of 10/28/2019   No Known Allergies     Medication List       Accurate as of October 28, 2019  2:27 PM. If you have any questions, ask your nurse or doctor.        aspirin 81 MG tablet Take 81 mg by mouth daily.   Farxiga 10 MG Tabs tablet Generic drug: dapagliflozin propanediol Take 10 mg by mouth daily.   Fish Oil 600 MG Caps Take 3,000 mg by mouth daily.   glimepiride 4 MG tablet Commonly known as: AMARYL Take 1 tablet (4 mg total) by mouth daily with breakfast.   Janumet 50-1000 MG tablet Generic drug: sitaGLIPtin-metformin TAKE 1 TABLET BY MOUTH TWICE DAILY WITH A MEAL   lisinopril-hydrochlorothiazide 20-12.5 MG tablet Commonly known as: ZESTORETIC Take 1 tablet by mouth daily.   meloxicam 15 MG tablet Commonly known as: MOBIC Take 1 tablet (15 mg total) by mouth daily.   multivitamin tablet Take 1 tablet by mouth daily.   omeprazole 40 MG capsule  Commonly known as: PRILOSEC TAKE ONE CAPSULE BY MOUTH ONCE DAILY AT BEDTIME   simvastatin 40 MG tablet Commonly known as: ZOCOR Take 1 tablet (40 mg total) by mouth every evening.        Objective:   BP 125/69   Pulse 82   Temp 98 F (36.7 C) (Temporal)   Ht _0  (1.651 m)   Wt 196 lb (88.9 kg)   SpO2 97%   BMI 32.62 kg/m   Wt Readings from Last 3 Encounters:  10/28/19 196 lb (88.9 kg)  06/20/19 192 lb 6.4 oz (87.3 kg)  02/28/19 200 lb 6.4 oz (90.9 kg)    Physical Exam Vitals signs and nursing note reviewed.  Constitutional:       General: He is not in acute distress.    Appearance: He is well-developed. He is not diaphoretic.  Eyes:     General: No scleral icterus.    Conjunctiva/sclera: Conjunctivae normal.  Neck:     Musculoskeletal: Neck supple.     Thyroid: No thyromegaly.  Cardiovascular:     Rate and Rhythm: Normal rate and regular rhythm.     Heart sounds: Normal heart sounds. No murmur.  Pulmonary:     Effort: Pulmonary effort is normal. No respiratory distress.     Breath sounds: Normal breath sounds. No wheezing.  Musculoskeletal: Normal range of motion.  Lymphadenopathy:     Cervical: No cervical adenopathy.  Skin:    General: Skin is warm and dry.     Findings: Rash (atypical 2 cm right groin lesion, irregular borders) present.  Neurological:     Mental Status: He is alert and oriented to person, place, and time.     Coordination: Coordination normal.  Psychiatric:        Behavior: Behavior normal.     Results for orders placed or performed in visit on 10/21/19  CBC with Differential/Platelet  Result Value Ref Range   WBC 6.5 3.4 - 10.8 x10E3/uL   RBC 4.98 4.14 - 5.80 x10E6/uL   Hemoglobin 15.1 13.0 - 17.7 g/dL   Hematocrit 45.7 37.5 - 51.0 %   MCV 92 79 - 97 fL   MCH 30.3 26.6 - 33.0 pg   MCHC 33.0 31.5 - 35.7 g/dL   RDW 12.9 11.6 - 15.4 %   Platelets 285 150 - 450 x10E3/uL   Neutrophils 47 Not Estab. %   Lymphs 34 Not Estab. %   Monocytes 11 Not Estab. %   Eos 7 Not Estab. %   Basos 1 Not Estab. %   Neutrophils Absolute 3.1 1.4 - 7.0 x10E3/uL   Lymphocytes Absolute 2.2 0.7 - 3.1 x10E3/uL   Monocytes Absolute 0.7 0.1 - 0.9 x10E3/uL   EOS (ABSOLUTE) 0.5 (H) 0.0 - 0.4 x10E3/uL   Basophils Absolute 0.0 0.0 - 0.2 x10E3/uL   Immature Granulocytes 0 Not Estab. %   Immature Grans (Abs) 0.0 0.0 - 0.1 x10E3/uL  CMP14+EGFR  Result Value Ref Range   Glucose 154 (H) 65 - 99 mg/dL   BUN 20 8 - 27 mg/dL   Creatinine, Ser 0.77 0.76 - 1.27 mg/dL   GFR calc non Af Amer 94 >59  mL/min/1.73   GFR calc Af Amer 109 >59 mL/min/1.73   BUN/Creatinine Ratio 26 (H) 10 - 24   Sodium 140 134 - 144 mmol/L   Potassium 4.8 3.5 - 5.2 mmol/L   Chloride 102 96 - 106 mmol/L   CO2 23 20 - 29 mmol/L   Calcium 8.9  8.6 - 10.2 mg/dL   Total Protein 6.9 6.0 - 8.5 g/dL   Albumin 4.3 3.8 - 4.8 g/dL   Globulin, Total 2.6 1.5 - 4.5 g/dL   Albumin/Globulin Ratio 1.7 1.2 - 2.2   Bilirubin Total 0.2 0.0 - 1.2 mg/dL   Alkaline Phosphatase 41 39 - 117 IU/L   AST 17 0 - 40 IU/L   ALT 24 0 - 44 IU/L  Lipid panel  Result Value Ref Range   Cholesterol, Total 119 100 - 199 mg/dL   Triglycerides 205 (H) 0 - 149 mg/dL   HDL 33 (L) >39 mg/dL   VLDL Cholesterol Cal 33 5 - 40 mg/dL   LDL Chol Calc (NIH) 53 0 - 99 mg/dL   Chol/HDL Ratio 3.6 0.0 - 5.0 ratio  Bayer DCA Hb A1c Waived  Result Value Ref Range   HB A1C (BAYER DCA - WAIVED) 6.8 <7.0 %    Assessment & Plan:   Problem List Items Addressed This Visit      Cardiovascular and Mediastinum   Hypertension associated with diabetes ([place])     Digestive   GERD (gastroesophageal reflux disease)     Endocrine   T2DM (type 2 diabetes mellitus) ([place]) - Primary   Hyperlipidemia associated with type 2 diabetes mellitus ([place])    Other Visit Diagnoses    Pigmented skin lesion       Relevant Orders   Ambulatory referral to Dermatology   Need for hepatitis C screening test       Relevant Orders   Hepatitis C antibody    Continue current medication, no change, everything was seen.   Follow up plan: Return in about 3 months (around 01/28/2020), or if symptoms worsen or fail to improve, for Diabetes and hypertension.  Counseling provided for all of the vaccine components No orders of the defined types were placed in this encounter.   Caryl Pina, MD [facility] 10/28/2019, 2:27 PM

## 2019-10-29 ENCOUNTER — Telehealth: Payer: Self-pay | Admitting: Family Medicine

## 2019-11-15 DIAGNOSIS — E119 Type 2 diabetes mellitus without complications: Secondary | ICD-10-CM | POA: Diagnosis not present

## 2019-11-15 DIAGNOSIS — G8929 Other chronic pain: Secondary | ICD-10-CM | POA: Diagnosis not present

## 2019-11-15 DIAGNOSIS — G4733 Obstructive sleep apnea (adult) (pediatric): Secondary | ICD-10-CM | POA: Diagnosis not present

## 2019-11-15 DIAGNOSIS — I10 Essential (primary) hypertension: Secondary | ICD-10-CM | POA: Diagnosis not present

## 2019-11-15 DIAGNOSIS — K219 Gastro-esophageal reflux disease without esophagitis: Secondary | ICD-10-CM | POA: Diagnosis not present

## 2019-11-15 DIAGNOSIS — N529 Male erectile dysfunction, unspecified: Secondary | ICD-10-CM | POA: Diagnosis not present

## 2019-11-15 DIAGNOSIS — E785 Hyperlipidemia, unspecified: Secondary | ICD-10-CM | POA: Diagnosis not present

## 2019-11-15 DIAGNOSIS — E669 Obesity, unspecified: Secondary | ICD-10-CM | POA: Diagnosis not present

## 2019-11-15 DIAGNOSIS — Z791 Long term (current) use of non-steroidal anti-inflammatories (NSAID): Secondary | ICD-10-CM | POA: Diagnosis not present

## 2019-11-15 DIAGNOSIS — M1990 Unspecified osteoarthritis, unspecified site: Secondary | ICD-10-CM | POA: Diagnosis not present

## 2019-11-19 ENCOUNTER — Other Ambulatory Visit: Payer: Self-pay | Admitting: Dermatology

## 2019-11-19 DIAGNOSIS — L820 Inflamed seborrheic keratosis: Secondary | ICD-10-CM | POA: Diagnosis not present

## 2019-11-19 DIAGNOSIS — D485 Neoplasm of uncertain behavior of skin: Secondary | ICD-10-CM | POA: Diagnosis not present

## 2019-11-19 DIAGNOSIS — L570 Actinic keratosis: Secondary | ICD-10-CM | POA: Diagnosis not present

## 2019-11-19 DIAGNOSIS — D229 Melanocytic nevi, unspecified: Secondary | ICD-10-CM | POA: Diagnosis not present

## 2019-11-19 DIAGNOSIS — B351 Tinea unguium: Secondary | ICD-10-CM | POA: Diagnosis not present

## 2019-12-18 ENCOUNTER — Other Ambulatory Visit: Payer: Self-pay | Admitting: Family Medicine

## 2020-01-29 ENCOUNTER — Other Ambulatory Visit: Payer: Self-pay

## 2020-01-30 ENCOUNTER — Ambulatory Visit (INDEPENDENT_AMBULATORY_CARE_PROVIDER_SITE_OTHER): Payer: Medicare HMO | Admitting: Family Medicine

## 2020-01-30 ENCOUNTER — Encounter: Payer: Self-pay | Admitting: Family Medicine

## 2020-01-30 VITALS — BP 132/72 | HR 74 | Temp 97.8°F | Ht 65.0 in | Wt 195.6 lb

## 2020-01-30 DIAGNOSIS — E1159 Type 2 diabetes mellitus with other circulatory complications: Secondary | ICD-10-CM

## 2020-01-30 DIAGNOSIS — R5383 Other fatigue: Secondary | ICD-10-CM

## 2020-01-30 DIAGNOSIS — I152 Hypertension secondary to endocrine disorders: Secondary | ICD-10-CM

## 2020-01-30 DIAGNOSIS — K219 Gastro-esophageal reflux disease without esophagitis: Secondary | ICD-10-CM

## 2020-01-30 DIAGNOSIS — E1169 Type 2 diabetes mellitus with other specified complication: Secondary | ICD-10-CM

## 2020-01-30 DIAGNOSIS — G4733 Obstructive sleep apnea (adult) (pediatric): Secondary | ICD-10-CM

## 2020-01-30 DIAGNOSIS — I10 Essential (primary) hypertension: Secondary | ICD-10-CM | POA: Diagnosis not present

## 2020-01-30 DIAGNOSIS — E785 Hyperlipidemia, unspecified: Secondary | ICD-10-CM

## 2020-01-30 DIAGNOSIS — Z1159 Encounter for screening for other viral diseases: Secondary | ICD-10-CM | POA: Diagnosis not present

## 2020-01-30 LAB — BAYER DCA HB A1C WAIVED: HB A1C (BAYER DCA - WAIVED): 7.4 % — ABNORMAL HIGH (ref <7.0)

## 2020-01-30 NOTE — Progress Notes (Signed)
BP 132/72   Pulse 74   Temp 97.8 F (36.6 C) (Temporal)   Ht '5\' 5"'  (1.651 m)   Wt 195 lb 9.6 oz (88.7 kg)   SpO2 97%   BMI 32.55 kg/m    Subjective:   Patient ID: Austin Ortiz, male    DOB: September 09, 1951, 68 y.o.   MRN: [id number]  HPI: Austin Ortiz is a 69 y.o. male presenting on 01/30/2020 for Diabetes (3 month follow up)   HPI Type 2 diabetes mellitus Patient comes in today for recheck of his diabetes. Patient has been currently taking [place] and Janumet and glimepiride. Patient is currently on an ACE inhibitor/ARB. Patient has not seen an ophthalmologist this year. Patient denies any issues with their feet.   Hypertension Patient is currently on lisinopril hydrochlorothiazide, and their blood pressure today is 132/72. Patient denies any lightheadedness or dizziness. Patient denies headaches, blurred vision, chest pains, shortness of breath, or weakness. Denies any side effects from medication and is content with current medication.   Hyperlipidemia Patient is coming in for recheck of his hyperlipidemia. The patient is currently taking simvastatin. They deny any issues with myalgias or history of liver damage from it. They deny any focal numbness or weakness or chest pain.   GERD Patient is currently on omeprazole.  She denies any major symptoms or abdominal pain or belching or burping. She denies any blood in her stool or lightheadedness or dizziness.   Patient has sleep apnea diagnosis from 10 years ago but he did lose some weight was not strong as much so he stopped using his machine, he still has the machine but he does not use it currently but he does complain of increasing fatigue over the past year and he has gained some of the weight back.  Patient does says he does not have as much energy through the day and just gets tired quicker.  He does say that he snores still but does not think that it is as loud as it was previously before he lost 30 pounds.  Relevant past  medical, surgical, family and social history reviewed and updated as indicated. Interim medical history since our last visit reviewed. Allergies and medications reviewed and updated.  Review of Systems  Constitutional: Positive for fatigue. Negative for chills and fever.  Eyes: Negative for visual disturbance.  Respiratory: Negative for shortness of breath and wheezing.   Cardiovascular: Negative for chest pain and leg swelling.  Musculoskeletal: Negative for back pain and gait problem.  Skin: Negative for rash.  Neurological: Negative for dizziness, weakness, light-headedness and numbness.  Psychiatric/Behavioral: Positive for sleep disturbance.  All other systems reviewed and are negative.   Per HPI unless specifically indicated above   Allergies as of 01/30/2020   No Known Allergies     Medication List       Accurate as of January 30, 2020  8:43 AM. If you have any questions, ask your nurse or doctor.        aspirin 81 MG tablet Take 81 mg by mouth daily.   Farxiga 10 MG Tabs tablet Generic drug: dapagliflozin propanediol Take 10 mg by mouth daily.   Fish Oil 600 MG Caps Take 3,000 mg by mouth daily.   glimepiride 4 MG tablet Commonly known as: AMARYL Take 1 tablet (4 mg total) by mouth daily with breakfast.   Janumet 50-1000 MG tablet Generic drug: sitaGLIPtin-metformin TAKE 1 TABLET BY MOUTH TWICE DAILY WITH A MEAL   lisinopril-hydrochlorothiazide 20-12.5 MG tablet  Commonly known as: ZESTORETIC Take 1 tablet by mouth daily.   meloxicam 15 MG tablet Commonly known as: MOBIC Take 1 tablet by mouth once daily   multivitamin tablet Take 1 tablet by mouth daily.   omeprazole 40 MG capsule Commonly known as: PRILOSEC TAKE ONE CAPSULE BY MOUTH ONCE DAILY AT BEDTIME   simvastatin 40 MG tablet Commonly known as: ZOCOR Take 1 tablet (40 mg total) by mouth every evening.        Objective:   BP 132/72   Pulse 74   Temp 97.8 F (36.6 C) (Temporal)    Ht '5\' 5"'  (1.651 m)   Wt 195 lb 9.6 oz (88.7 kg)   SpO2 97%   BMI 32.55 kg/m   Wt Readings from Last 3 Encounters:  01/30/20 195 lb 9.6 oz (88.7 kg)  10/28/19 196 lb (88.9 kg)  06/20/19 192 lb 6.4 oz (87.3 kg)    Physical Exam Vitals and nursing note reviewed.  Constitutional:      General: He is not in acute distress.    Appearance: He is well-developed. He is not diaphoretic.  Eyes:     General: No scleral icterus.    Conjunctiva/sclera: Conjunctivae normal.  Neck:     Thyroid: No thyromegaly.  Cardiovascular:     Rate and Rhythm: Normal rate and regular rhythm.     Heart sounds: Normal heart sounds. No murmur.  Pulmonary:     Effort: Pulmonary effort is normal. No respiratory distress.     Breath sounds: Normal breath sounds. No wheezing.  Musculoskeletal:        General: No swelling. Normal range of motion.     Cervical back: Neck supple.  Lymphadenopathy:     Cervical: No cervical adenopathy.  Skin:    General: Skin is warm and dry.     Findings: No rash.  Neurological:     Mental Status: He is alert and oriented to person, place, and time.     Coordination: Coordination normal.  Psychiatric:        Behavior: Behavior normal.       Assessment & Plan:   Problem List Items Addressed This Visit      Cardiovascular and Mediastinum   Hypertension associated with diabetes ([place])     Respiratory   Obstructive sleep apnea   Relevant Orders   Testosterone,Free and Total   Thyroid Panel With TSH     Digestive   GERD (gastroesophageal reflux disease)     Endocrine   T2DM (type 2 diabetes mellitus) ([place]) - Primary   Relevant Orders   Bayer DCA Hb A1c Waived   BMP8+EGFR   Hyperlipidemia associated with type 2 diabetes mellitus ([place])    Other Visit Diagnoses    Need for hepatitis C screening test       Relevant Orders   Hepatitis C antibody   Fatigue, unspecified type       Relevant Orders   Testosterone,Free and Total   Thyroid Panel With TSH        That is having a little extra fatigue patient seems to be doing well.  His A1c is up at 7.4, he says is mostly because of the holidays and he will refocus on diet and lifestyle modification, no medication adjustments for now  Recheck in 3 months  Follow up plan: Return in about 3 months (around 04/28/2020), or if symptoms worsen or fail to improve, for Diabetes and hypertension cholesterol.  Counseling provided for all of the vaccine  components Orders Placed This Encounter  Procedures  . Bayer DCA Hb A1c Waived  . Hepatitis C antibody  . BMP8+EGFR  . Testosterone,Free and Total  . Thyroid Panel With TSH    Caryl Pina, MD [facility] 01/30/2020, 8:43 AM

## 2020-02-03 ENCOUNTER — Ambulatory Visit: Payer: BC Managed Care – PPO

## 2020-02-05 ENCOUNTER — Encounter: Payer: Self-pay | Admitting: Family Medicine

## 2020-02-06 LAB — HEPATITIS C ANTIBODY: Hep C Virus Ab: <0.1 s/co ratio (ref 0.0–0.9)

## 2020-02-06 LAB — BMP8+EGFR
BUN/Creatinine Ratio: 25 — ABNORMAL HIGH (ref 10–24)
BUN: 25 mg/dL (ref 8–27)
CO2: 20 mmol/L (ref 20–29)
Calcium: 9.5 mg/dL (ref 8.6–10.2)
Chloride: 98 mmol/L (ref 96–106)
Creatinine, Ser: 0.99 mg/dL (ref 0.76–1.27)
GFR calc Af Amer: 91 mL/min/{1.73_m2} (ref >59)
GFR calc non Af Amer: 78 mL/min/{1.73_m2} (ref >59)
Glucose: 156 mg/dL — ABNORMAL HIGH (ref 65–99)
Potassium: 5.1 mmol/L (ref 3.5–5.2)
Sodium: 137 mmol/L (ref 134–144)

## 2020-02-06 LAB — THYROID PANEL WITH TSH
Free Thyroxine Index: 2.1 (ref 1.2–4.9)
T3 Uptake Ratio: 27 % (ref 24–39)
T4, Total: 7.6 ug/dL (ref 4.5–12.0)
TSH: 6.89 u[IU]/mL — ABNORMAL HIGH (ref 0.450–4.500)

## 2020-02-06 LAB — TESTOSTERONE,FREE AND TOTAL
Testosterone, Free: 11.7 pg/mL (ref 6.6–18.1)
Testosterone: 317 ng/dL (ref 264–916)

## 2020-02-14 NOTE — Progress Notes (Signed)
Assessment & Plan:  1. Acute pain of right knee - Exercises provided on knee exercises. Encouraged to continue meloxicam as needed.  - predniSONE (STERAPRED UNI-PAK 21 TAB) 10 MG (21) TBPK tablet; As directed x 6 days  Dispense: 21 tablet; Refill: 0   Follow up plan: Return if symptoms worsen or fail to improve.  Hendricks Limes, MSN, APRN, FNP-C [facility]  Subjective:   Patient ID: Austin Ortiz, male    DOB: 09-30-1951, 69 y.o.   MRN: [id number]  HPI: Austin Ortiz is a 69 y.o. male presenting on 02/18/2020 for Knee Pain (right x 1 week after moving hay )  Knee Pain: Patient presents with knee pain involving the right knee. Onset of the symptoms was a week ago and are improving. Inciting event: began while moving hay. Current symptoms include pain located medially. Pain is aggravated by standing.  Patient has had prior knee problems. Evaluation to date: none. Treatment to date: avoidance of offending activity and prescription NSAIDS which are effective.   ROS: Negative unless specifically indicated above in HPI.   Relevant past medical history reviewed and updated as indicated.   Allergies and medications reviewed and updated.   Current Outpatient Medications:  .  aspirin 81 MG tablet, Take 81 mg by mouth daily., Disp: , Rfl:  .  dapagliflozin propanediol (FARXIGA) 10 MG TABS tablet, Take 10 mg by mouth daily., Disp: 90 tablet, Rfl: 3 .  glimepiride (AMARYL) 4 MG tablet, Take 1 tablet (4 mg total) by mouth daily with breakfast., Disp: 90 tablet, Rfl: 3 .  lisinopril-hydrochlorothiazide (ZESTORETIC) 20-12.5 MG tablet, Take 1 tablet by mouth daily., Disp: 90 tablet, Rfl: 3 .  meloxicam (MOBIC) 15 MG tablet, Take 1 tablet by mouth once daily, Disp: 90 tablet, Rfl: 0 .  Multiple Vitamin (MULTIVITAMIN) tablet, Take 1 tablet by mouth daily., Disp: , Rfl:  .  Omega-3 Fatty Acids (FISH OIL) 600 MG CAPS, Take 3,000 mg by mouth daily. , Disp: , Rfl:  .  omeprazole  (PRILOSEC) 40 MG capsule, TAKE ONE CAPSULE BY MOUTH ONCE DAILY AT BEDTIME, Disp: 90 capsule, Rfl: 3 .  simvastatin (ZOCOR) 40 MG tablet, Take 1 tablet (40 mg total) by mouth every evening., Disp: 90 tablet, Rfl: 3 .  sitaGLIPtin-metformin (JANUMET) 50-1000 MG tablet, TAKE 1 TABLET BY MOUTH TWICE DAILY WITH A MEAL, Disp: 180 tablet, Rfl: 3 .  predniSONE (STERAPRED UNI-PAK 21 TAB) 10 MG (21) TBPK tablet, As directed x 6 days, Disp: 21 tablet, Rfl: 0  No Known Allergies  Objective:   BP 128/73   Pulse 89   Temp 97.8 F (36.6 C) (Temporal)   Ht 5\' 5"  (1.651 m)   Wt 197 lb 12.8 oz (89.7 kg)   SpO2 97%   BMI 32.92 kg/m    Physical Exam Vitals reviewed.  Constitutional:      General: He is not in acute distress.    Appearance: Normal appearance. He is not ill-appearing, toxic-appearing or diaphoretic.  HENT:     Head: Normocephalic and atraumatic.  Eyes:     General: No scleral icterus.       Right eye: No discharge.        Left eye: No discharge.     Conjunctiva/sclera: Conjunctivae normal.  Cardiovascular:     Rate and Rhythm: Normal rate.  Pulmonary:     Effort: Pulmonary effort is normal. No respiratory distress.  Musculoskeletal:        General: Normal range of motion.  Cervical back: Normal range of motion.     Right knee: No swelling, deformity, effusion, erythema, ecchymosis, lacerations, bony tenderness or crepitus. Tenderness present over the medial joint line. No LCL laxity, MCL laxity, ACL laxity or PCL laxity. Normal alignment, normal meniscus and normal patellar mobility. Normal pulse.     Instability Tests: Negative medial McMurray test and negative lateral McMurray test.  Skin:    General: Skin is warm and dry.  Neurological:     Mental Status: He is alert and oriented to person, place, and time. Mental status is at baseline.  Psychiatric:        Mood and Affect: Mood normal.        Behavior: Behavior normal.        Thought Content: Thought content normal.         Judgment: Judgment normal.

## 2020-02-17 ENCOUNTER — Other Ambulatory Visit: Payer: Self-pay

## 2020-02-18 ENCOUNTER — Ambulatory Visit (INDEPENDENT_AMBULATORY_CARE_PROVIDER_SITE_OTHER): Payer: Medicare HMO | Admitting: Family Medicine

## 2020-02-18 ENCOUNTER — Encounter: Payer: Self-pay | Admitting: Family Medicine

## 2020-02-18 VITALS — BP 128/73 | HR 89 | Temp 97.8°F | Ht 65.0 in | Wt 197.8 lb

## 2020-02-18 DIAGNOSIS — M25561 Pain in right knee: Secondary | ICD-10-CM

## 2020-02-18 MED ORDER — PREDNISONE 10 MG (21) PO TBPK: ORAL_TABLET | ORAL | 0 refills | Status: DC

## 2020-02-18 NOTE — Patient Instructions (Signed)
Journal for Nurse Practitioners, 15(4), 263-267. Retrieved October 01, 2018 from [email or web address].com/[email or web address]">  Knee Exercises Ask your health care provider which exercises are safe for you. Do exercises exactly as told by your health care provider and adjust them as directed. It is normal to feel mild stretching, pulling, tightness, or discomfort as you do these exercises. Stop right away if you feel sudden pain or your pain gets worse. Do not begin these exercises until told by your health care provider. Stretching and range-of-motion exercises These exercises warm up your muscles and joints and improve the movement and flexibility of your knee. These exercises also help to relieve pain and swelling. Knee extension, prone 1. Lie on your abdomen (prone position) on a bed. 2. Place your left / right knee just beyond the edge of the surface so your knee is not on the bed. You can put a towel under your left / right thigh just above your kneecap for comfort. 3. Relax your leg muscles and allow gravity to straighten your knee (extension). You should feel a stretch behind your left / right knee. 4. Hold this position for __________ seconds. 5. Scoot up so your knee is supported between repetitions. Repeat __________ times. Complete this exercise __________ times a day. Knee flexion, active  1. Lie on your back with both legs straight. If this causes back discomfort, bend your left / right knee so your foot is flat on the floor. 2. Slowly slide your left / right heel back toward your buttocks. Stop when you feel a gentle stretch in the front of your knee or thigh (flexion). 3. Hold this position for __________ seconds. 4. Slowly slide your left / right heel back to the starting position. Repeat __________ times. Complete this exercise __________ times a day. Quadriceps stretch, prone  1. Lie on your abdomen on a firm surface, such as a bed or padded floor. 2. Bend your left / right knee and hold  your ankle. If you cannot reach your ankle or pant leg, loop a belt around your foot and grab the belt instead. 3. Gently pull your heel toward your buttocks. Your knee should not slide out to the side. You should feel a stretch in the front of your thigh and knee (quadriceps). 4. Hold this position for __________ seconds. Repeat __________ times. Complete this exercise __________ times a day. Hamstring, supine 1. Lie on your back (supine position). 2. Loop a belt or towel over the ball of your left / right foot. The ball of your foot is on the walking surface, right under your toes. 3. Straighten your left / right knee and slowly pull on the belt to raise your leg until you feel a gentle stretch behind your knee (hamstring). ? Do not let your knee bend while you do this. ? Keep your other leg flat on the floor. 4. Hold this position for __________ seconds. Repeat __________ times. Complete this exercise __________ times a day. Strengthening exercises These exercises build strength and endurance in your knee. Endurance is the ability to use your muscles for a long time, even after they get tired. Quadriceps, isometric This exercise stretches the muscles in front of your thigh (quadriceps) without moving your knee joint (isometric). 1. Lie on your back with your left / right leg extended and your other knee bent. Put a rolled towel or small pillow under your knee if told by your health care provider. 2. Slowly tense the muscles in the front of your left /   right thigh. You should see your kneecap slide up toward your hip or see increased dimpling just above the knee. This motion will push the back of the knee toward the floor. 3. For __________ seconds, hold the muscle as tight as you can without increasing your pain. 4. Relax the muscles slowly and completely. Repeat __________ times. Complete this exercise __________ times a day. Straight leg raises This exercise stretches the muscles in front  of your thigh (quadriceps) and the muscles that move your hips (hip flexors). 1. Lie on your back with your left / right leg extended and your other knee bent. 2. Tense the muscles in the front of your left / right thigh. You should see your kneecap slide up or see increased dimpling just above the knee. Your thigh may even shake a bit. 3. Keep these muscles tight as you raise your leg 4-6 inches (10-15 cm) off the floor. Do not let your knee bend. 4. Hold this position for __________ seconds. 5. Keep these muscles tense as you lower your leg. 6. Relax your muscles slowly and completely after each repetition. Repeat __________ times. Complete this exercise __________ times a day. Hamstring, isometric 1. Lie on your back on a firm surface. 2. Bend your left / right knee about __________ degrees. 3. Dig your left / right heel into the surface as if you are trying to pull it toward your buttocks. Tighten the muscles in the back of your thighs (hamstring) to "dig" as hard as you can without increasing any pain. 4. Hold this position for __________ seconds. 5. Release the tension gradually and allow your muscles to relax completely for __________ seconds after each repetition. Repeat __________ times. Complete this exercise __________ times a day. Hamstring curls If told by your health care provider, do this exercise while wearing ankle weights. Begin with __________ lb weights. Then increase the weight by 1 lb (0.5 kg) increments. Do not wear ankle weights that are more than __________ lb. 1. Lie on your abdomen with your legs straight. 2. Bend your left / right knee as far as you can without feeling pain. Keep your hips flat against the floor. 3. Hold this position for __________ seconds. 4. Slowly lower your leg to the starting position. Repeat __________ times. Complete this exercise __________ times a day. Squats This exercise strengthens the muscles in front of your thigh and knee  (quadriceps). 1. Stand in front of a table, with your feet and knees pointing straight ahead. You may rest your hands on the table for balance but not for support. 2. Slowly bend your knees and lower your hips like you are going to sit in a chair. ? Keep your weight over your heels, not over your toes. ? Keep your lower legs upright so they are parallel with the table legs. ? Do not let your hips go lower than your knees. ? Do not bend lower than told by your health care provider. ? If your knee pain increases, do not bend as low. 3. Hold the squat position for __________ seconds. 4. Slowly push with your legs to return to standing. Do not use your hands to pull yourself to standing. Repeat __________ times. Complete this exercise __________ times a day. Wall slides This exercise strengthens the muscles in front of your thigh and knee (quadriceps). 1. Lean your back against a smooth wall or door, and walk your feet out 18-24 inches (46-61 cm) from it. 2. Place your feet hip-width apart. 3.   Slowly slide down the wall or door until your knees bend __________ degrees. Keep your knees over your heels, not over your toes. Keep your knees in line with your hips. 4. Hold this position for __________ seconds. Repeat __________ times. Complete this exercise __________ times a day. Straight leg raises This exercise strengthens the muscles that rotate the leg at the hip and move it away from your body (hip abductors). 1. Lie on your side with your left / right leg in the top position. Lie so your head, shoulder, knee, and hip line up. You may bend your bottom knee to help you keep your balance. 2. Roll your hips slightly forward so your hips are stacked directly over each other and your left / right knee is facing forward. 3. Leading with your heel, lift your top leg 4-6 inches (10-15 cm). You should feel the muscles in your outer hip lifting. ? Do not let your foot drift forward. ? Do not let your knee  roll toward the ceiling. 4. Hold this position for __________ seconds. 5. Slowly return your leg to the starting position. 6. Let your muscles relax completely after each repetition. Repeat __________ times. Complete this exercise __________ times a day. Straight leg raises This exercise stretches the muscles that move your hips away from the front of the pelvis (hip extensors). 1. Lie on your abdomen on a firm surface. You can put a pillow under your hips if that is more comfortable. 2. Tense the muscles in your buttocks and lift your left / right leg about 4-6 inches (10-15 cm). Keep your knee straight as you lift your leg. 3. Hold this position for __________ seconds. 4. Slowly lower your leg to the starting position. 5. Let your leg relax completely after each repetition. Repeat __________ times. Complete this exercise __________ times a day. This information is not intended to replace advice given to you by your health care provider. Make sure you discuss any questions you have with your health care provider. Document Revised: 10/02/2018 Document Reviewed: 10/02/2018 Elsevier Patient Education  2020 [employer] Inc.  

## 2020-03-19 ENCOUNTER — Other Ambulatory Visit: Payer: Self-pay | Admitting: Family Medicine

## 2020-04-30 ENCOUNTER — Ambulatory Visit (INDEPENDENT_AMBULATORY_CARE_PROVIDER_SITE_OTHER): Payer: Medicare HMO | Admitting: Family Medicine

## 2020-04-30 ENCOUNTER — Other Ambulatory Visit: Payer: Self-pay

## 2020-04-30 ENCOUNTER — Encounter: Payer: Self-pay | Admitting: Family Medicine

## 2020-04-30 VITALS — BP 110/60 | HR 67 | Temp 97.1°F | Ht 65.0 in | Wt 196.5 lb

## 2020-04-30 DIAGNOSIS — E039 Hypothyroidism, unspecified: Secondary | ICD-10-CM | POA: Diagnosis not present

## 2020-04-30 DIAGNOSIS — E1159 Type 2 diabetes mellitus with other circulatory complications: Secondary | ICD-10-CM | POA: Diagnosis not present

## 2020-04-30 DIAGNOSIS — E785 Hyperlipidemia, unspecified: Secondary | ICD-10-CM | POA: Diagnosis not present

## 2020-04-30 DIAGNOSIS — K219 Gastro-esophageal reflux disease without esophagitis: Secondary | ICD-10-CM

## 2020-04-30 DIAGNOSIS — I152 Hypertension secondary to endocrine disorders: Secondary | ICD-10-CM

## 2020-04-30 DIAGNOSIS — E1169 Type 2 diabetes mellitus with other specified complication: Secondary | ICD-10-CM | POA: Diagnosis not present

## 2020-04-30 DIAGNOSIS — I10 Essential (primary) hypertension: Secondary | ICD-10-CM | POA: Diagnosis not present

## 2020-04-30 LAB — BAYER DCA HB A1C WAIVED: HB A1C (BAYER DCA - WAIVED): 8 % — ABNORMAL HIGH (ref <7.0)

## 2020-04-30 NOTE — Progress Notes (Signed)
BP 110/60   Pulse 67   Temp (!) 97.1 F (36.2 C) (Temporal)   Ht '5\' 5"'  (1.651 m)   Wt 196 lb 8 oz (89.1 kg)   BMI 32.70 kg/m    Subjective:   Patient ID: Austin Ortiz, male    DOB: [birth year], 69 y.o.   MRN: [id number]  HPI: Austin Ortiz is a 69 y.o. male presenting on 04/30/2020 for Medical Management of Chronic Issues   HPI Hypothyroidism recheck Patient is coming in for thyroid recheck today as well. They deny any issues with hair changes or heat or cold problems or diarrhea or constipation. They deny any chest pain or palpitations. They are currently on levothyroxine no medication currently was just off the last time.   Type 2 diabetes mellitus Patient comes in today for recheck of his diabetes. Patient has been currently taking [place] and Janumet and glimepiride. Patient is currently on an ACE inhibitor/ARB. Patient has not seen an ophthalmologist this year. Patient denies any issues with their feet. The symptom started onset as an adult hyperlipidemia and hypothyroidism hypertension and GERD ARE RELATED TO DM   Hyperlipidemia Patient is coming in for recheck of his hyperlipidemia. The patient is currently taking fish oil and simvastatin. They deny any issues with myalgias or history of liver damage from it. They deny any focal numbness or weakness or chest pain.   Hypertension Patient is currently on lisinopril and hydrochlorothiazide, and their blood pressure today is 110/60. Patient denies any lightheadedness or dizziness. Patient denies headaches, blurred vision, chest pains, shortness of breath, or weakness. Denies any side effects from medication and is content with current medication.   GERD Patient is currently on omeprazole.  She denies any major symptoms or abdominal pain or belching or burping. She denies any blood in her stool or lightheadedness or dizziness.   Relevant past medical, surgical, family and social history reviewed and updated as indicated. Interim  medical history since our last visit reviewed. Allergies and medications reviewed and updated.  Review of Systems  Constitutional: Negative for chills and fever.  Eyes: Negative for visual disturbance.  Respiratory: Negative for shortness of breath and wheezing.   Cardiovascular: Negative for chest pain and leg swelling.  Musculoskeletal: Negative for back pain and gait problem.  Skin: Negative for rash.  Neurological: Negative for dizziness, weakness and light-headedness.  All other systems reviewed and are negative.   Per HPI unless specifically indicated above   Allergies as of 04/30/2020   No Known Allergies     Medication List       Accurate as of Apr 30, 2020  8:12 AM. If you have any questions, ask your nurse or doctor.        STOP taking these medications   predniSONE 10 MG (21) Tbpk tablet Commonly known as: STERAPRED UNI-PAK 21 TAB Stopped by: Fransisca Kaufmann [name], MD     TAKE these medications   aspirin 81 MG tablet Take 81 mg by mouth daily.   Farxiga 10 MG Tabs tablet Generic drug: dapagliflozin propanediol Take 10 mg by mouth daily.   Fish Oil 600 MG Caps Take 3,000 mg by mouth daily.   glimepiride 4 MG tablet Commonly known as: AMARYL Take 1 tablet (4 mg total) by mouth daily with breakfast.   Janumet 50-1000 MG tablet Generic drug: sitaGLIPtin-metformin TAKE 1 TABLET BY MOUTH TWICE DAILY WITH A MEAL   lisinopril-hydrochlorothiazide 20-12.5 MG tablet Commonly known as: ZESTORETIC Take 1 tablet by mouth daily.  meloxicam 15 MG tablet Commonly known as: MOBIC Take 1 tablet by mouth once daily   multivitamin tablet Take 1 tablet by mouth daily.   omeprazole 40 MG capsule Commonly known as: PRILOSEC TAKE ONE CAPSULE BY MOUTH ONCE DAILY AT BEDTIME   simvastatin 40 MG tablet Commonly known as: ZOCOR Take 1 tablet (40 mg total) by mouth every evening.        Objective:   BP 110/60   Pulse 67   Temp (!) 97.1 F (36.2 C) (Temporal)    Ht '5\' 5"'  (1.651 m)   Wt 196 lb 8 oz (89.1 kg)   BMI 32.70 kg/m   Wt Readings from Last 3 Encounters:  04/30/20 196 lb 8 oz (89.1 kg)  02/18/20 197 lb 12.8 oz (89.7 kg)  01/30/20 195 lb 9.6 oz (88.7 kg)    Physical Exam Vitals and nursing note reviewed.  Constitutional:      General: He is not in acute distress.    Appearance: He is well-developed. He is not diaphoretic.  Eyes:     General: No scleral icterus.    Conjunctiva/sclera: Conjunctivae normal.  Neck:     Thyroid: No thyromegaly.  Cardiovascular:     Rate and Rhythm: Normal rate and regular rhythm.     Heart sounds: Normal heart sounds. No murmur.  Pulmonary:     Effort: Pulmonary effort is normal. No respiratory distress.     Breath sounds: Normal breath sounds. No wheezing.  Musculoskeletal:        General: Normal range of motion.     Cervical back: Neck supple.  Lymphadenopathy:     Cervical: No cervical adenopathy.  Skin:    General: Skin is warm and dry.     Findings: No rash.  Neurological:     Mental Status: He is alert and oriented to person, place, and time.     Coordination: Coordination normal.  Psychiatric:        Behavior: Behavior normal.       Assessment & Plan:   Problem List Items Addressed This Visit      Cardiovascular and Mediastinum   Hypertension associated with diabetes ([place])   Relevant Orders   CMP14+EGFR     Digestive   GERD (gastroesophageal reflux disease)   Relevant Orders   CBC with Differential/Platelet     Endocrine   T2DM (type 2 diabetes mellitus) ([place]) - Primary   Relevant Orders   Bayer DCA Hb A1c Waived   CBC with Differential/Platelet   Hyperlipidemia associated with type 2 diabetes mellitus ([place])   Relevant Orders   Lipid panel    Other Visit Diagnoses    Acquired hypothyroidism       Relevant Orders   TSH   Thyroid antibodies      Continue current medication, no change, we will recheck thyroid today Follow up plan: Return in about 3 months  (around 07/31/2020), or if symptoms worsen or fail to improve, for Diabetes and hypertension cholesterol recheck.  Counseling provided for all of the vaccine components Orders Placed This Encounter  Procedures  . Bayer [facility] Hb A1c Martorell, MD [facility] 04/30/2020, 8:12 AM

## 2020-05-01 LAB — CBC WITH DIFFERENTIAL/PLATELET
Basophils Absolute: 0 10*3/uL (ref 0.0–0.2)
Basos: 0 %
EOS (ABSOLUTE): 0.2 10*3/uL (ref 0.0–0.4)
Eos: 3 %
Hematocrit: 45.3 % (ref 37.5–51.0)
Hemoglobin: 15.2 g/dL (ref 13.0–17.7)
Immature Grans (Abs): 0 10*3/uL (ref 0.0–0.1)
Immature Granulocytes: 0 %
Lymphocytes Absolute: 2.5 10*3/uL (ref 0.7–3.1)
Lymphs: 34 %
MCH: 30.5 pg (ref 26.6–33.0)
MCHC: 33.6 g/dL (ref 31.5–35.7)
MCV: 91 fL (ref 79–97)
Monocytes Absolute: 0.8 10*3/uL (ref 0.1–0.9)
Monocytes: 11 %
Neutrophils Absolute: 3.8 10*3/uL (ref 1.4–7.0)
Neutrophils: 52 %
Platelets: 275 10*3/uL (ref 150–450)
RBC: 4.99 x10E6/uL (ref 4.14–5.80)
RDW: 12.9 % (ref 11.6–15.4)
WBC: 7.4 10*3/uL (ref 3.4–10.8)

## 2020-05-01 LAB — THYROID ANTIBODIES
Thyroglobulin Antibody: 4.1 IU/mL — ABNORMAL HIGH (ref 0.0–0.9)
Thyroperoxidase Ab SerPl-aCnc: 12 IU/mL (ref 0–34)

## 2020-05-01 LAB — CMP14+EGFR
ALT: 22 IU/L (ref 0–44)
AST: 14 IU/L (ref 0–40)
Albumin/Globulin Ratio: 1.7 (ref 1.2–2.2)
Albumin: 4.5 g/dL (ref 3.8–4.8)
Alkaline Phosphatase: 42 IU/L (ref 39–117)
BUN/Creatinine Ratio: 25 — ABNORMAL HIGH (ref 10–24)
BUN: 22 mg/dL (ref 8–27)
Bilirubin Total: 0.3 mg/dL (ref 0.0–1.2)
CO2: 22 mmol/L (ref 20–29)
Calcium: 9.3 mg/dL (ref 8.6–10.2)
Chloride: 100 mmol/L (ref 96–106)
Creatinine, Ser: 0.87 mg/dL (ref 0.76–1.27)
GFR calc Af Amer: 103 mL/min/{1.73_m2} (ref >59)
GFR calc non Af Amer: 89 mL/min/{1.73_m2} (ref >59)
Globulin, Total: 2.6 g/dL (ref 1.5–4.5)
Glucose: 143 mg/dL — ABNORMAL HIGH (ref 65–99)
Potassium: 4.8 mmol/L (ref 3.5–5.2)
Sodium: 138 mmol/L (ref 134–144)
Total Protein: 7.1 g/dL (ref 6.0–8.5)

## 2020-05-01 LAB — LIPID PANEL
Chol/HDL Ratio: 4.4 ratio (ref 0.0–5.0)
Cholesterol, Total: 136 mg/dL (ref 100–199)
HDL: 31 mg/dL — ABNORMAL LOW (ref >39)
LDL Chol Calc (NIH): 61 mg/dL (ref 0–99)
Triglycerides: 278 mg/dL — ABNORMAL HIGH (ref 0–149)
VLDL Cholesterol Cal: 44 mg/dL — ABNORMAL HIGH (ref 5–40)

## 2020-05-01 LAB — TSH: TSH: 4.86 u[IU]/mL — ABNORMAL HIGH (ref 0.450–4.500)

## 2020-05-07 ENCOUNTER — Other Ambulatory Visit: Payer: Self-pay | Admitting: *Deleted

## 2020-05-07 DIAGNOSIS — R946 Abnormal results of thyroid function studies: Secondary | ICD-10-CM

## 2020-05-28 ENCOUNTER — Telehealth: Payer: Self-pay | Admitting: Family Medicine

## 2020-06-17 ENCOUNTER — Other Ambulatory Visit: Payer: Self-pay | Admitting: Family Medicine

## 2020-06-30 ENCOUNTER — Other Ambulatory Visit: Payer: Self-pay | Admitting: Family Medicine

## 2020-06-30 DIAGNOSIS — E785 Hyperlipidemia, unspecified: Secondary | ICD-10-CM

## 2020-07-08 LAB — HM DIABETES EYE EXAM

## 2020-07-15 ENCOUNTER — Other Ambulatory Visit: Payer: Self-pay | Admitting: Family Medicine

## 2020-07-15 DIAGNOSIS — E1169 Type 2 diabetes mellitus with other specified complication: Secondary | ICD-10-CM

## 2020-07-27 ENCOUNTER — Other Ambulatory Visit: Payer: Self-pay | Admitting: Family Medicine

## 2020-07-27 DIAGNOSIS — E1169 Type 2 diabetes mellitus with other specified complication: Secondary | ICD-10-CM

## 2020-08-03 ENCOUNTER — Encounter: Payer: Self-pay | Admitting: Family Medicine

## 2020-08-03 ENCOUNTER — Other Ambulatory Visit: Payer: Self-pay

## 2020-08-03 ENCOUNTER — Ambulatory Visit (INDEPENDENT_AMBULATORY_CARE_PROVIDER_SITE_OTHER): Payer: Medicare HMO | Admitting: Family Medicine

## 2020-08-03 VITALS — BP 114/65 | HR 66 | Temp 98.6°F | Ht 65.0 in | Wt 196.0 lb

## 2020-08-03 DIAGNOSIS — E1169 Type 2 diabetes mellitus with other specified complication: Secondary | ICD-10-CM | POA: Diagnosis not present

## 2020-08-03 DIAGNOSIS — K30 Functional dyspepsia: Secondary | ICD-10-CM | POA: Diagnosis not present

## 2020-08-03 DIAGNOSIS — I152 Hypertension secondary to endocrine disorders: Secondary | ICD-10-CM

## 2020-08-03 DIAGNOSIS — E785 Hyperlipidemia, unspecified: Secondary | ICD-10-CM | POA: Diagnosis not present

## 2020-08-03 DIAGNOSIS — I10 Essential (primary) hypertension: Secondary | ICD-10-CM

## 2020-08-03 DIAGNOSIS — E1159 Type 2 diabetes mellitus with other circulatory complications: Secondary | ICD-10-CM

## 2020-08-03 DIAGNOSIS — K219 Gastro-esophageal reflux disease without esophagitis: Secondary | ICD-10-CM

## 2020-08-03 LAB — BAYER DCA HB A1C WAIVED: HB A1C (BAYER DCA - WAIVED): 7.7 % — ABNORMAL HIGH (ref <7.0)

## 2020-08-03 NOTE — Progress Notes (Signed)
BP 114/65   Pulse 66   Temp 98.6 F (37 C)   Ht '5\' 5"'  (1.651 m)   Wt 196 lb (88.9 kg)   SpO2 98%   BMI 32.62 kg/m    Subjective:   Patient ID: Austin Ortiz, male    DOB: [birth year], 69 y.o.   MRN: [id number]  HPI: Austin Ortiz is a 69 y.o. male presenting on 08/03/2020 for Medical Management of Chronic Issues and Diabetes   HPI Type 2 diabetes mellitus Patient comes in today for recheck of his diabetes. Patient has been currently taking [place] and glimepiride and Janumet. Patient is currently on an ACE inhibitor/ARB. Patient has seen an ophthalmologist this year. Patient denies any issues with their feet. The symptom started onset as an adult hypertension and hyperlipidemia and GERD ARE RELATED TO DM   Hypertension Patient is currently on lisinopril hydrochlorothiazide, and their blood pressure today is 114/65. Patient denies any lightheadedness or dizziness. Patient denies headaches, blurred vision, chest pains, shortness of breath, or weakness. Denies any side effects from medication and is content with current medication.  Hyperlipidemia Patient is coming in for recheck of his hyperlipidemia. The patient is currently taking a simvastatin. They deny any issues with myalgias or history of liver damage from it. They deny any focal numbness or weakness or chest pain.   GERD Patient is currently on omeprazole.  She denies any major symptoms or abdominal pain or belching or burping. She denies any blood in her stool or lightheadedness or dizziness.   Patient does complain of some indigestion that he thinks is been associated with after eating pork, he does not seem to have issues with beef, he has had multiple tick bites over the past year, he works on a farm, he has not had any recently but his wife does have alpha gal allergy and is wondering if he has it as well and wants to be tested for it.  He has tried eliminating pork out of his diet he seems to be doing better and just wants  to confirm deny whether it is an issue.  Relevant past medical, surgical, family and social history reviewed and updated as indicated. Interim medical history since our last visit reviewed. Allergies and medications reviewed and updated.  Review of Systems  Constitutional: Negative for chills and fever.  Respiratory: Negative for shortness of breath and wheezing.   Cardiovascular: Negative for chest pain and leg swelling.  Gastrointestinal: Positive for diarrhea and nausea. Negative for abdominal distention, abdominal pain, constipation and vomiting.  Musculoskeletal: Negative for back pain and gait problem.  Skin: Negative for rash.  All other systems reviewed and are negative.   Per HPI unless specifically indicated above   Allergies as of 08/03/2020   No Known Allergies     Medication List       Accurate as of August 03, 2020  8:02 AM. If you have any questions, ask your nurse or doctor.        aspirin 81 MG tablet Take 81 mg by mouth daily.   Farxiga 10 MG Tabs tablet Generic drug: dapagliflozin propanediol Take 10 mg by mouth daily.   Fish Oil 600 MG Caps Take 3,000 mg by mouth daily.   glimepiride 4 MG tablet Commonly known as: AMARYL Take 1 tablet by mouth once daily with breakfast   Janumet 50-1000 MG tablet Generic drug: sitaGLIPtin-metformin TAKE 1 TABLET BY MOUTH TWICE DAILY WITH A MEAL   lisinopril-hydrochlorothiazide 20-12.5 MG tablet Commonly known  as: ZESTORETIC Take 1 tablet by mouth once daily   meloxicam 15 MG tablet Commonly known as: MOBIC Take 1 tablet by mouth once daily   multivitamin tablet Take 1 tablet by mouth daily.   omeprazole 40 MG capsule Commonly known as: PRILOSEC TAKE ONE CAPSULE BY MOUTH ONCE DAILY AT BEDTIME   simvastatin 40 MG tablet Commonly known as: ZOCOR TAKE 1 TABLET BY MOUTH ONCE DAILY IN THE EVENING        Objective:   BP 114/65   Pulse 66   Temp 98.6 F (37 C)   Ht '5\' 5"'  (1.651 m)   Wt 196 lb (88.9  kg)   SpO2 98%   BMI 32.62 kg/m   Wt Readings from Last 3 Encounters:  08/03/20 196 lb (88.9 kg)  04/30/20 196 lb 8 oz (89.1 kg)  02/18/20 197 lb 12.8 oz (89.7 kg)    Physical Exam Vitals and nursing note reviewed.  Constitutional:      General: He is not in acute distress.    Appearance: He is well-developed. He is not diaphoretic.  Eyes:     General: No scleral icterus.    Conjunctiva/sclera: Conjunctivae normal.  Neck:     Thyroid: No thyromegaly.  Cardiovascular:     Rate and Rhythm: Normal rate and regular rhythm.     Heart sounds: Normal heart sounds. No murmur heard.   Pulmonary:     Effort: Pulmonary effort is normal. No respiratory distress.     Breath sounds: Normal breath sounds. No wheezing.  Musculoskeletal:        General: Normal range of motion.     Cervical back: Neck supple.  Lymphadenopathy:     Cervical: No cervical adenopathy.  Skin:    General: Skin is warm and dry.     Findings: No rash.  Neurological:     Mental Status: He is alert and oriented to person, place, and time.     Coordination: Coordination normal.  Psychiatric:        Behavior: Behavior normal.     Results for orders placed or performed in visit on 07/16/20  HM DIABETES EYE EXAM  Result Value Ref Range   HM Diabetic Eye Exam No Retinopathy No Retinopathy    Assessment & Plan:   Problem List Items Addressed This Visit      Cardiovascular and Mediastinum   Hypertension associated with diabetes ([place])   Relevant Orders   CMP14+EGFR     Digestive   GERD (gastroesophageal reflux disease)     Endocrine   T2DM (type 2 diabetes mellitus) ([place]) - Primary   Relevant Orders   Bayer DCA Hb A1c Waived   TSH   Hyperlipidemia associated with type 2 diabetes mellitus ([place])    Other Visit Diagnoses    Indigestion       Relevant Orders   Alpha-Gal Panel      A1c is still pending, will watch for this we will test for alpha gal because of symptoms and tick bites in the past  year.  Follow up plan: Return in about 3 months (around 11/03/2020), or if symptoms worsen or fail to improve, for diabetes and htn .  Counseling provided for all of the vaccine components Orders Placed This Encounter  Procedures  . Bayer [facility] Hb A1c Waived    Caryl Pina, MD [facility] 08/03/2020, 8:02 AM

## 2020-08-10 LAB — ALPHA-GAL PANEL
Alpha Gal IgE*: 5.38 kU/L — ABNORMAL HIGH (ref <0.10)
Beef (Bos spp) IgE: 1.56 kU/L — ABNORMAL HIGH (ref <0.35)
Class Interpretation: 1
Class Interpretation: 2
Lamb/Mutton (Ovis spp) IgE: 0.16 kU/L (ref <0.35)
Pork (Sus spp) IgE: 0.62 kU/L — ABNORMAL HIGH (ref <0.35)

## 2020-08-10 LAB — CMP14+EGFR
ALT: 30 IU/L (ref 0–44)
AST: 18 IU/L (ref 0–40)
Albumin/Globulin Ratio: 1.8 (ref 1.2–2.2)
Albumin: 4.4 g/dL (ref 3.8–4.8)
Alkaline Phosphatase: 40 IU/L — ABNORMAL LOW (ref 48–121)
BUN/Creatinine Ratio: 22 (ref 10–24)
BUN: 20 mg/dL (ref 8–27)
Bilirubin Total: 0.3 mg/dL (ref 0.0–1.2)
CO2: 22 mmol/L (ref 20–29)
Calcium: 9.4 mg/dL (ref 8.6–10.2)
Chloride: 101 mmol/L (ref 96–106)
Creatinine, Ser: 0.93 mg/dL (ref 0.76–1.27)
GFR calc Af Amer: 98 mL/min/{1.73_m2} (ref >59)
GFR calc non Af Amer: 85 mL/min/{1.73_m2} (ref >59)
Globulin, Total: 2.5 g/dL (ref 1.5–4.5)
Glucose: 176 mg/dL — ABNORMAL HIGH (ref 65–99)
Potassium: 5 mmol/L (ref 3.5–5.2)
Sodium: 139 mmol/L (ref 134–144)
Total Protein: 6.9 g/dL (ref 6.0–8.5)

## 2020-08-10 LAB — TSH: TSH: 2.95 u[IU]/mL (ref 0.450–4.500)

## 2020-09-02 ENCOUNTER — Other Ambulatory Visit: Payer: Self-pay | Admitting: Family Medicine

## 2020-09-02 DIAGNOSIS — K2100 Gastro-esophageal reflux disease with esophagitis, without bleeding: Secondary | ICD-10-CM

## 2020-09-02 DIAGNOSIS — G4733 Obstructive sleep apnea (adult) (pediatric): Secondary | ICD-10-CM | POA: Diagnosis not present

## 2020-09-10 ENCOUNTER — Encounter: Payer: Self-pay | Admitting: *Deleted

## 2020-09-17 ENCOUNTER — Other Ambulatory Visit: Payer: Self-pay | Admitting: Family Medicine

## 2020-09-21 ENCOUNTER — Other Ambulatory Visit: Payer: Self-pay | Admitting: Family Medicine

## 2020-09-25 ENCOUNTER — Other Ambulatory Visit (HOSPITAL_BASED_OUTPATIENT_CLINIC_OR_DEPARTMENT_OTHER): Payer: Self-pay

## 2020-09-25 DIAGNOSIS — R0681 Apnea, not elsewhere classified: Secondary | ICD-10-CM

## 2020-09-29 ENCOUNTER — Ambulatory Visit: Payer: Medicare HMO | Attending: Otolaryngology | Admitting: Neurology

## 2020-09-29 ENCOUNTER — Other Ambulatory Visit: Payer: Self-pay

## 2020-09-29 ENCOUNTER — Other Ambulatory Visit: Payer: Self-pay | Admitting: Family Medicine

## 2020-09-29 DIAGNOSIS — G4730 Sleep apnea, unspecified: Secondary | ICD-10-CM | POA: Diagnosis present

## 2020-09-29 DIAGNOSIS — G4733 Obstructive sleep apnea (adult) (pediatric): Secondary | ICD-10-CM | POA: Insufficient documentation

## 2020-09-29 DIAGNOSIS — R0681 Apnea, not elsewhere classified: Secondary | ICD-10-CM

## 2020-09-29 DIAGNOSIS — E1169 Type 2 diabetes mellitus with other specified complication: Secondary | ICD-10-CM

## 2020-10-04 NOTE — Procedures (Signed)
  [place] A. Merlene Laughter, MD     [email or web address]             NOCTURNAL POLYSOMNOGRAPHY   LOCATION: [facility]   Patient Name: Austin Ortiz, Austin Ortiz Date: 09/29/2020 Gender: Male D.O.B: [birth year] Age (years): 68 Referring Provider: Melida Quitter Height (inches): 65 Interpreting Physician: Phillips Odor MD, ABSM Weight (lbs): 198 RPSGT: Peak, Robert BMI: 33 MRN: [id number] Neck Size: 18.00 CLINICAL INFORMATION The patient is referred for a split night study with BPAP. MEDICATIONS Medications self-administered by patient taken the night of the study : N/A  SLEEP STUDY TECHNIQUE As per the AASM Manual for the Scoring of Sleep and Associated Events v2.3 (April 2016) with a hypopnea requiring 4% desaturations.  The channels recorded and monitored were frontal, central and occipital EEG, electrooculogram (EOG), submentalis EMG (chin), nasal and oral airflow, thoracic and abdominal wall motion, anterior tibialis EMG, snore microphone, electrocardiogram, and pulse oximetry. Bi-level positive airway pressure (BiPAP) was initiated when the patient met split night criteria and was titrated according to treat sleep-disordered breathing.  RESPIRATORY PARAMETERS Diagnostic  Total AHI (/hr): 65.1 RDI (/hr): 65.4 OA Index (/hr): 41.1 CA Index (/hr): 9.2 REM AHI (/hr): 31.6 NREM AHI (/hr): 68.8 Supine AHI (/hr): 65.1 Non-supine AHI (/hr): N/A Min O2 Sat (%): 80.0 Mean O2 (%): 94.3 Time below 88% (min): 1.8   Titration  Optimal IPAP Pressure (cm): 13 Optimal EPAP Pressure (cm): 8 AHI at Optimal Pressure (/hr): 1.4 Min O2 at Optimal Pressure (%): 89.0 Sleep % at Optimal (%): 96 Supine % at Optimal (%): 100     SLEEP ARCHITECTURE The study was initiated at 9:17:35 PM and terminated at 5:11:12 AM. The total recorded time was 473.6 minutes. EEG confirmed total sleep time was 330.7 minutes yielding a sleep efficiency of 69.8%%. Sleep onset after lights out was 109.9 minutes  with a REM latency of 155.5 minutes. The patient spent 9.5%% of the night in stage N1 sleep, 69.3%% in stage N2 sleep, 0.0%% in stage N3 and 21.2% in REM. Wake after sleep onset (WASO) was 33.0 minutes. The Arousal Index was 11.1/hour.  LEG MOVEMENT DATA The total Periodic Limb Movements of Sleep (PLMS) were 0. The PLMS index was 0.0 .  CARDIAC DATA The 2 lead EKG demonstrated sinus rhythm. The mean heart rate was 100.0 beats per minute. Other EKG findings include: None.  IMPRESSIONS Severe obstructive sleep apnea occurred during the diagnostic portion of the study (AHI = 65.1 /hour). The optimal BIPAP selected for this patient are ( 12 / cm of water)  Delano Metz, MD Diplomate, American Board of Sleep Medicine.  ELECTRONICALLY SIGNED ON:  10/04/2020, 10:20 PM [place] PH: [phone]   FX: [phone] [place]

## 2020-10-09 ENCOUNTER — Other Ambulatory Visit: Payer: Self-pay | Admitting: Family Medicine

## 2020-10-09 DIAGNOSIS — E1169 Type 2 diabetes mellitus with other specified complication: Secondary | ICD-10-CM

## 2020-10-12 MED ORDER — LISINOPRIL-HYDROCHLOROTHIAZIDE 20-12.5 MG PO TABS: 1.0000 | ORAL_TABLET | Freq: Every day | ORAL | 0 refills | Status: DC

## 2020-10-12 MED ORDER — GLIMEPIRIDE 4 MG PO TABS: ORAL_TABLET | ORAL | 0 refills | Status: DC

## 2020-10-12 NOTE — Telephone Encounter (Signed)
E-prescribe down. resent 

## 2020-10-12 NOTE — Addendum Note (Signed)
Addended by: Antonietta Barcelona D on: 10/12/2020 10:13 AM   Modules accepted: Orders

## 2020-10-13 ENCOUNTER — Other Ambulatory Visit: Payer: Self-pay | Admitting: Family Medicine

## 2020-10-13 DIAGNOSIS — E1169 Type 2 diabetes mellitus with other specified complication: Secondary | ICD-10-CM

## 2020-10-13 DIAGNOSIS — R69 Illness, unspecified: Secondary | ICD-10-CM | POA: Diagnosis not present

## 2020-10-27 ENCOUNTER — Other Ambulatory Visit: Payer: Self-pay | Admitting: Family Medicine

## 2020-10-27 DIAGNOSIS — E1169 Type 2 diabetes mellitus with other specified complication: Secondary | ICD-10-CM

## 2020-11-05 ENCOUNTER — Encounter: Payer: Self-pay | Admitting: Gastroenterology

## 2020-11-05 ENCOUNTER — Ambulatory Visit (INDEPENDENT_AMBULATORY_CARE_PROVIDER_SITE_OTHER): Payer: Medicare HMO | Admitting: Family Medicine

## 2020-11-05 ENCOUNTER — Encounter: Payer: Self-pay | Admitting: Family Medicine

## 2020-11-05 ENCOUNTER — Other Ambulatory Visit: Payer: Self-pay

## 2020-11-05 ENCOUNTER — Telehealth: Payer: Self-pay | Admitting: *Deleted

## 2020-11-05 VITALS — BP 146/70 | HR 76 | Temp 98.0°F | Ht 65.0 in | Wt 197.0 lb

## 2020-11-05 DIAGNOSIS — E1169 Type 2 diabetes mellitus with other specified complication: Secondary | ICD-10-CM | POA: Diagnosis not present

## 2020-11-05 DIAGNOSIS — I152 Hypertension secondary to endocrine disorders: Secondary | ICD-10-CM

## 2020-11-05 DIAGNOSIS — K2100 Gastro-esophageal reflux disease with esophagitis, without bleeding: Secondary | ICD-10-CM

## 2020-11-05 DIAGNOSIS — E785 Hyperlipidemia, unspecified: Secondary | ICD-10-CM

## 2020-11-05 DIAGNOSIS — E1159 Type 2 diabetes mellitus with other circulatory complications: Secondary | ICD-10-CM | POA: Diagnosis not present

## 2020-11-05 LAB — LIPID PANEL
Chol/HDL Ratio: 3.8 ratio (ref 0.0–5.0)
Cholesterol, Total: 123 mg/dL (ref 100–199)
HDL: 32 mg/dL — ABNORMAL LOW (ref >39)
LDL Chol Calc (NIH): 55 mg/dL (ref 0–99)
Triglycerides: 221 mg/dL — ABNORMAL HIGH (ref 0–149)
VLDL Cholesterol Cal: 36 mg/dL (ref 5–40)

## 2020-11-05 LAB — CMP14+EGFR
ALT: 33 IU/L (ref 0–44)
AST: 25 IU/L (ref 0–40)
Albumin/Globulin Ratio: 1.8 (ref 1.2–2.2)
Albumin: 4.7 g/dL (ref 3.8–4.8)
Alkaline Phosphatase: 45 IU/L (ref 44–121)
BUN/Creatinine Ratio: 20 (ref 10–24)
BUN: 18 mg/dL (ref 8–27)
Bilirubin Total: 0.4 mg/dL (ref 0.0–1.2)
CO2: 22 mmol/L (ref 20–29)
Calcium: 9.7 mg/dL (ref 8.6–10.2)
Chloride: 99 mmol/L (ref 96–106)
Creatinine, Ser: 0.89 mg/dL (ref 0.76–1.27)
GFR calc Af Amer: 102 mL/min/{1.73_m2} (ref >59)
GFR calc non Af Amer: 88 mL/min/{1.73_m2} (ref >59)
Globulin, Total: 2.6 g/dL (ref 1.5–4.5)
Glucose: 163 mg/dL — ABNORMAL HIGH (ref 65–99)
Potassium: 5.2 mmol/L (ref 3.5–5.2)
Sodium: 138 mmol/L (ref 134–144)
Total Protein: 7.3 g/dL (ref 6.0–8.5)

## 2020-11-05 LAB — CBC WITH DIFFERENTIAL/PLATELET
Basophils Absolute: 0 10*3/uL (ref 0.0–0.2)
Basos: 1 %
EOS (ABSOLUTE): 0 10*3/uL (ref 0.0–0.4)
Eos: 0 %
Hematocrit: 47.8 % (ref 37.5–51.0)
Hemoglobin: 15.5 g/dL (ref 13.0–17.7)
Immature Grans (Abs): 0 10*3/uL (ref 0.0–0.1)
Immature Granulocytes: 0 %
Lymphocytes Absolute: 2.4 10*3/uL (ref 0.7–3.1)
Lymphs: 34 %
MCH: 29.6 pg (ref 26.6–33.0)
MCHC: 32.4 g/dL (ref 31.5–35.7)
MCV: 91 fL (ref 79–97)
Monocytes Absolute: 0.8 10*3/uL (ref 0.1–0.9)
Monocytes: 11 %
Neutrophils Absolute: 3.9 10*3/uL (ref 1.4–7.0)
Neutrophils: 54 %
Platelets: 289 10*3/uL (ref 150–450)
RBC: 5.23 x10E6/uL (ref 4.14–5.80)
RDW: 12.8 % (ref 11.6–15.4)
WBC: 7.2 10*3/uL (ref 3.4–10.8)

## 2020-11-05 LAB — BAYER DCA HB A1C WAIVED: HB A1C (BAYER DCA - WAIVED): 7.5 % — ABNORMAL HIGH (ref <7.0)

## 2020-11-05 MED ORDER — GLIMEPIRIDE 4 MG PO TABS: ORAL_TABLET | ORAL | 3 refills | Status: DC

## 2020-11-05 MED ORDER — LISINOPRIL-HYDROCHLOROTHIAZIDE 20-12.5 MG PO TABS: 1.0000 | ORAL_TABLET | Freq: Every day | ORAL | 3 refills | Status: DC

## 2020-11-05 MED ORDER — SIMVASTATIN 40 MG PO TABS: 40.0000 mg | ORAL_TABLET | Freq: Every evening | ORAL | 3 refills | Status: DC

## 2020-11-05 MED ORDER — JANUMET 50-1000 MG PO TABS: ORAL_TABLET | ORAL | 3 refills | Status: DC

## 2020-11-05 MED ORDER — MELOXICAM 15 MG PO TABS
15.0000 mg | ORAL_TABLET | Freq: Every day | ORAL | 3 refills | Status: DC
Start: 2020-11-05 — End: 2021-08-11

## 2020-11-05 MED ORDER — SILDENAFIL CITRATE 20 MG PO TABS
20.0000 mg | ORAL_TABLET | ORAL | 1 refills | Status: DC | PRN
Start: 2020-11-05 — End: 2021-08-11

## 2020-11-05 MED ORDER — DAPAGLIFLOZIN PROPANEDIOL 10 MG PO TABS: 10.0000 mg | ORAL_TABLET | Freq: Every day | ORAL | 3 refills | Status: DC

## 2020-11-05 MED ORDER — OMEPRAZOLE 40 MG PO CPDR: DELAYED_RELEASE_CAPSULE | ORAL | 3 refills | Status: DC

## 2020-11-05 NOTE — Progress Notes (Signed)
BP (!) 146/70   Pulse 76   Temp 98 F (36.7 C)   Ht '5\' 5"'  (1.651 m)   Wt 197 lb (89.4 kg)   SpO2 100%   BMI 32.78 kg/m    Subjective:   Patient ID: Austin Ortiz, male    DOB: December 02, 1951, 69 y.o.   MRN: [id number]  HPI: Austin Ortiz is a 69 y.o. male presenting on 11/05/2020 for Medical Management of Chronic Issues and Diabetes   HPI Type 2 diabetes mellitus Patient comes in today for recheck of his diabetes. Patient has been currently taking [place] and glimepiride and Janumet. Patient is currently on an ACE inhibitor/ARB. Patient has seen an ophthalmologist this year. Patient denies any issues with their feet. The symptom started onset as an adult hypertension and hyperlipidemia ARE RELATED TO DM   Hypertension Patient is currently on lisinopril hydrochlorothiazide, and their blood pressure today is 146/70. Patient denies any lightheadedness or dizziness. Patient denies headaches, blurred vision, chest pains, shortness of breath, or weakness. Denies any side effects from medication and is content with current medication.   Hyperlipidemia Patient is coming in for recheck of his hyperlipidemia. The patient is currently taking simvastatin. They deny any issues with myalgias or history of liver damage from it. They deny any focal numbness or weakness or chest pain.   Relevant past medical, surgical, family and social history reviewed and updated as indicated. Interim medical history since our last visit reviewed. Allergies and medications reviewed and updated.  Review of Systems  Constitutional: Negative for chills and fever.  Respiratory: Negative for shortness of breath and wheezing.   Cardiovascular: Negative for chest pain and leg swelling.  Musculoskeletal: Negative for back pain and gait problem.  Skin: Negative for rash.  Neurological: Negative for dizziness, seizures, weakness and numbness.  All other systems reviewed and are negative.   Per HPI unless specifically  indicated above   Allergies as of 11/05/2020   No Known Allergies     Medication List       Accurate as of November 05, 2020  9:08 AM. If you have any questions, ask your nurse or doctor.        aspirin 81 MG tablet Take 81 mg by mouth daily.   Farxiga 10 MG Tabs tablet Generic drug: dapagliflozin propanediol Take 1 tablet by mouth once daily   Fish Oil 600 MG Caps Take 3,000 mg by mouth daily.   glimepiride 4 MG tablet Commonly known as: AMARYL Take 1 tablet by mouth once daily with breakfast   Janumet 50-1000 MG tablet Generic drug: sitaGLIPtin-metformin TAKE 1 TABLET BY MOUTH TWICE DAILY WITH A MEAL   lisinopril-hydrochlorothiazide 20-12.5 MG tablet Commonly known as: ZESTORETIC Take 1 tablet by mouth once daily   meloxicam 15 MG tablet Commonly known as: MOBIC Take 1 tablet by mouth once daily   multivitamin tablet Take 1 tablet by mouth daily.   omeprazole 40 MG capsule Commonly known as: PRILOSEC Take 1 capsule by mouth once daily at bedtime   simvastatin 40 MG tablet Commonly known as: ZOCOR TAKE 1 TABLET BY MOUTH ONCE DAILY IN THE EVENING        Objective:   BP (!) 146/70   Pulse 76   Temp 98 F (36.7 C)   Ht '5\' 5"'  (1.651 m)   Wt 197 lb (89.4 kg)   SpO2 100%   BMI 32.78 kg/m   Wt Readings from Last 3 Encounters:  11/05/20 197 lb (89.4 kg)  08/03/20 196 lb (88.9 kg)  04/30/20 196 lb 8 oz (89.1 kg)    Physical Exam Vitals and nursing note reviewed.  Constitutional:      General: He is not in acute distress.    Appearance: He is well-developed. He is not diaphoretic.  Eyes:     General: No scleral icterus.    Conjunctiva/sclera: Conjunctivae normal.  Neck:     Thyroid: No thyromegaly.  Cardiovascular:     Rate and Rhythm: Normal rate and regular rhythm.     Heart sounds: Normal heart sounds. No murmur heard.   Pulmonary:     Effort: Pulmonary effort is normal. No respiratory distress.     Breath sounds: Normal breath sounds.  No wheezing.  Musculoskeletal:        General: Normal range of motion.     Cervical back: Neck supple.  Lymphadenopathy:     Cervical: No cervical adenopathy.  Skin:    General: Skin is warm and dry.     Findings: No rash.  Neurological:     Mental Status: He is alert and oriented to person, place, and time.     Coordination: Coordination normal.  Psychiatric:        Behavior: Behavior normal.     A1c 7.5  Assessment & Plan:   Problem List Items Addressed This Visit      Cardiovascular and Mediastinum   Hypertension associated with diabetes ([place])   Relevant Medications   dapagliflozin propanediol (FARXIGA) 10 MG TABS tablet   glimepiride (AMARYL) 4 MG tablet   sitaGLIPtin-metformin (JANUMET) 50-1000 MG tablet   lisinopril-hydrochlorothiazide (ZESTORETIC) 20-12.5 MG tablet   simvastatin (ZOCOR) 40 MG tablet   sildenafil (REVATIO) 20 MG tablet     Endocrine   T2DM (type 2 diabetes mellitus) (HCC) - Primary   Relevant Medications   dapagliflozin propanediol (FARXIGA) 10 MG TABS tablet   glimepiride (AMARYL) 4 MG tablet   sitaGLIPtin-metformin (JANUMET) 50-1000 MG tablet   lisinopril-hydrochlorothiazide (ZESTORETIC) 20-12.5 MG tablet   simvastatin (ZOCOR) 40 MG tablet   Other Relevant Orders   Bayer DCA Hb A1c Waived   CMP14+EGFR   CMP14+EGFR   Hyperlipidemia associated with type 2 diabetes mellitus (HCC)   Relevant Medications   dapagliflozin propanediol (FARXIGA) 10 MG TABS tablet   glimepiride (AMARYL) 4 MG tablet   sitaGLIPtin-metformin (JANUMET) 50-1000 MG tablet   lisinopril-hydrochlorothiazide (ZESTORETIC) 20-12.5 MG tablet   simvastatin (ZOCOR) 40 MG tablet    Other Visit Diagnoses    Gastroesophageal reflux disease with esophagitis       Relevant Medications   omeprazole (PRILOSEC) 40 MG capsule   Other Relevant Orders   CBC with Differential/Platelet   Hyperlipidemia       Relevant Medications   lisinopril-hydrochlorothiazide (ZESTORETIC) 20-12.5 MG  tablet   simvastatin (ZOCOR) 40 MG tablet   sildenafil (REVATIO) 20 MG tablet   Other Relevant Orders   Lipid panel    Will continue to focus on diet and lifestyle modification no change in medication at this point.  Patient did want to try sildenafil for erectile dysfunction so we will try prescription for that.  Follow up plan: Return in about 3 months (around 02/05/2021), or if symptoms worsen or fail to improve, for Hypertension and dm recheck.  Counseling provided for all of the vaccine components Orders Placed This Encounter  Procedures  . [facility] Hb A1c Waived    Caryl Pina, MD [facility] 11/05/2020, 9:08 AM

## 2020-11-05 NOTE — Telephone Encounter (Signed)
PA for Sildenafil came in today - Sent to plan   Key: [id number]

## 2020-11-06 NOTE — Telephone Encounter (Signed)
Your request was denied We have denied coverage or payment under your Medicare Part D benefit for the following prescription drug(s) that you or your prescriber requested: SILDENAFIL CITRATE Tablet Why did we deny your request? We denied this request under Medicare Part D because: The information provided by your prescriber did not meet the requirements for covering this medication (prior authorization). Your plan does not allow coverage of this medication based on your prescriber answering No to the following  Question(s): Does the patient have a diagnosis of pulmonary arterial hypertension (PAH) (World Health Organization [WHO] Group 1)?

## 2020-11-08 NOTE — Telephone Encounter (Signed)
Tell patient to use good Rx to get it for cheaper, commonly these medicines are not covered by insurance

## 2020-11-09 ENCOUNTER — Encounter: Payer: Self-pay | Admitting: Gastroenterology

## 2020-11-09 NOTE — Telephone Encounter (Signed)
Patient aware and verbalizes understanding. 

## 2020-11-30 ENCOUNTER — Other Ambulatory Visit: Payer: Self-pay | Admitting: Family Medicine

## 2020-11-30 DIAGNOSIS — K2100 Gastro-esophageal reflux disease with esophagitis, without bleeding: Secondary | ICD-10-CM

## 2020-12-02 ENCOUNTER — Other Ambulatory Visit: Payer: Self-pay | Admitting: Family Medicine

## 2020-12-02 DIAGNOSIS — K2100 Gastro-esophageal reflux disease with esophagitis, without bleeding: Secondary | ICD-10-CM

## 2020-12-15 ENCOUNTER — Other Ambulatory Visit: Payer: Self-pay | Admitting: Family Medicine

## 2020-12-15 DIAGNOSIS — E1169 Type 2 diabetes mellitus with other specified complication: Secondary | ICD-10-CM

## 2020-12-23 ENCOUNTER — Other Ambulatory Visit: Payer: Self-pay | Admitting: Family Medicine

## 2020-12-23 DIAGNOSIS — E785 Hyperlipidemia, unspecified: Secondary | ICD-10-CM

## 2021-01-08 ENCOUNTER — Other Ambulatory Visit: Payer: Self-pay

## 2021-01-08 ENCOUNTER — Ambulatory Visit (AMBULATORY_SURGERY_CENTER): Payer: Medicare HMO | Admitting: *Deleted

## 2021-01-08 VITALS — Ht 65.0 in | Wt 195.0 lb

## 2021-01-08 DIAGNOSIS — Z1211 Encounter for screening for malignant neoplasm of colon: Secondary | ICD-10-CM

## 2021-01-08 MED ORDER — GOLYTELY 236 G PO SOLR: 4000.0000 mL | Freq: Once | ORAL | 0 refills | Status: AC

## 2021-01-08 NOTE — Progress Notes (Signed)
Patient's pre-visit was done today over the phone with the patient due to COVID-19 pandemic. Name,DOB and address verified. Insurance verified. Packet of Prep instructions mailed to patient including a copy of a consent form and pre-procedure patient acknowledgement form-pt is aware. Patient understands to call us back with any questions or concerns. COVID-19 vaccines completed on 10/31/2020 x3, per patient. Pt is aware that care partner can wait in the car during procedure or they may wait in the 4 th floor lobby. Patient is aware to bring only one care partner. Patient and care partner will wear a mask into building.

## 2021-01-14 ENCOUNTER — Other Ambulatory Visit: Payer: Self-pay | Admitting: Family Medicine

## 2021-01-14 DIAGNOSIS — E1169 Type 2 diabetes mellitus with other specified complication: Secondary | ICD-10-CM

## 2021-01-15 ENCOUNTER — Encounter: Payer: Self-pay | Admitting: Gastroenterology

## 2021-01-22 ENCOUNTER — Encounter: Payer: Self-pay | Admitting: Gastroenterology

## 2021-01-22 ENCOUNTER — Other Ambulatory Visit: Payer: Self-pay

## 2021-01-22 ENCOUNTER — Ambulatory Visit (AMBULATORY_SURGERY_CENTER): Payer: Medicare HMO | Admitting: Gastroenterology

## 2021-01-22 VITALS — BP 124/71 | HR 64 | Temp 97.3°F | Resp 23 | Ht 65.0 in | Wt 195.0 lb

## 2021-01-22 DIAGNOSIS — K635 Polyp of colon: Secondary | ICD-10-CM | POA: Diagnosis not present

## 2021-01-22 DIAGNOSIS — Z1211 Encounter for screening for malignant neoplasm of colon: Secondary | ICD-10-CM | POA: Diagnosis not present

## 2021-01-22 DIAGNOSIS — D122 Benign neoplasm of ascending colon: Secondary | ICD-10-CM

## 2021-01-22 MED ORDER — SODIUM CHLORIDE 0.9 % IV SOLN: 500.0000 mL | INTRAVENOUS | Status: DC

## 2021-01-22 NOTE — Patient Instructions (Signed)
   care-partner.

## 2021-01-22 NOTE — Progress Notes (Signed)
pt tolerated well. VSS. awake and to recovery. Report given to RN.  

## 2021-01-22 NOTE — Progress Notes (Signed)
Called to room to assist during endoscopic procedure.  Patient ID and intended procedure confirmed with present staff. Received instructions for my participation in the procedure from the performing physician.  

## 2021-01-22 NOTE — Progress Notes (Signed)
Pt's states no medical or surgical changes since previsit or office visit. 

## 2021-01-22 NOTE — Op Note (Signed)
[place] Patient Name: Austin Ortiz Procedure Date: 01/22/2021 8:59 AM MRN: [id number] Endoscopist: Mallie Mussel L. Loletha Carrow , MD Age: 70 Referring MD:  Date of Birth: [birth year] Gender: Male Account #: [id number] Procedure:                Colonoscopy Indications:              Screening for colorectal malignant neoplasm (last                            colonoscopy 2011) Medicines:                Monitored Anesthesia Care Procedure:                Pre-Anesthesia Assessment:                           - Prior to the procedure, a History and Physical                            was performed, and patient medications and                            allergies were reviewed. The patient's tolerance of                            previous anesthesia was also reviewed. The risks                            and benefits of the procedure and the sedation                            options and risks were discussed with the patient.                            All questions were answered, and informed consent                            was obtained. Prior Anticoagulants: The patient has                            taken no previous anticoagulant or antiplatelet                            agents except for aspirin. ASA Grade Assessment:                            III - A patient with severe systemic disease. After                            reviewing the risks and benefits, the patient was                            deemed in satisfactory condition to undergo the  procedure.                           After obtaining informed consent, the colonoscope                            was passed under direct vision. Throughout the                            procedure, the patient's blood pressure, pulse, and                            oxygen saturations were monitored continuously. The                            Olympus CF-[id number]) Colonoscope was                             introduced through the anus and advanced to the the                            cecum, identified by appendiceal orifice and                            ileocecal valve. The colonoscopy was performed                            without difficulty. The patient tolerated the                            procedure well. The quality of the bowel                            preparation was good. The ileocecal valve,                            appendiceal orifice, and rectum were photographed. Scope In: 9:07:25 AM Scope Out: 9:19:52 AM Scope Withdrawal Time: 0 hours 11 minutes 10 seconds  Total Procedure Duration: 0 hours 12 minutes 27 seconds  Findings:                 The perianal and digital rectal examinations were                            normal.                           A 5 mm polyp was found in the ascending colon. The                            polyp was sessile. The polyp was removed with a                            cold snare. Resection and retrieval were complete.  The exam was otherwise without abnormality on                            direct and retroflexion views. Complications:            No immediate complications. Estimated Blood Loss:     Estimated blood loss was minimal. Impression:               - One 5 mm polyp in the ascending colon, removed                            with a cold snare. Resected and retrieved.                           - The examination was otherwise normal on direct                            and retroflexion views. Recommendation:           - Patient has a contact number available for                            emergencies. The signs and symptoms of potential                            delayed complications were discussed with the                            patient. Return to normal activities tomorrow.                            Written discharge instructions were provided to the                            patient.                            - Resume previous diet.                           - Continue present medications.                           - Await pathology results.                           - Repeat colonoscopy is recommended for                            surveillance. The colonoscopy date will be                            determined after pathology results from today's                            exam become available for review. Aliviah Spain L. Loletha Carrow, MD 01/22/2021 9:25:39 AM This report has  been signed electronically.

## 2021-01-26 ENCOUNTER — Other Ambulatory Visit: Payer: Self-pay | Admitting: Family Medicine

## 2021-01-26 ENCOUNTER — Telehealth: Payer: Self-pay | Admitting: *Deleted

## 2021-01-26 DIAGNOSIS — E1169 Type 2 diabetes mellitus with other specified complication: Secondary | ICD-10-CM

## 2021-01-26 NOTE — Telephone Encounter (Signed)
  Follow up Call-  Call back number 01/22/2021  Post procedure Call Back phone  # [phone]  Permission to leave phone message Yes  Some recent data might be hidden     Patient questions:  Do you have a fever, pain , or abdominal swelling? No. Pain Score  0 *  Have you tolerated food without any problems? Yes.    Have you been able to return to your normal activities? Yes.    Do you have any questions about your discharge instructions: Diet   No. Medications  No. Follow up visit  No.  Do you have questions or concerns about your Care? No.  Actions: * If pain score is 4 or above: No action needed, pain <4.  1. Have you developed a fever since your procedure? no  2.   Have you had an respiratory symptoms (SOB or cough) since your procedure? no  3.   Have you tested positive for COVID 19 since your procedure no  4.   Have you had any family members/close contacts diagnosed with the COVID 19 since your procedure?  no   If yes to any of these questions please route to Joylene John, RN and Joella Prince, RN

## 2021-01-29 ENCOUNTER — Encounter: Payer: Self-pay | Admitting: Gastroenterology

## 2021-02-01 ENCOUNTER — Other Ambulatory Visit (HOSPITAL_BASED_OUTPATIENT_CLINIC_OR_DEPARTMENT_OTHER): Payer: Self-pay

## 2021-02-01 DIAGNOSIS — G4730 Sleep apnea, unspecified: Secondary | ICD-10-CM

## 2021-02-08 ENCOUNTER — Other Ambulatory Visit: Payer: Self-pay

## 2021-02-08 ENCOUNTER — Ambulatory Visit (INDEPENDENT_AMBULATORY_CARE_PROVIDER_SITE_OTHER): Payer: Medicare HMO | Admitting: Family Medicine

## 2021-02-08 ENCOUNTER — Encounter: Payer: Self-pay | Admitting: Family Medicine

## 2021-02-08 VITALS — BP 128/76 | HR 73 | Ht 65.0 in | Wt 195.0 lb

## 2021-02-08 DIAGNOSIS — E1159 Type 2 diabetes mellitus with other circulatory complications: Secondary | ICD-10-CM

## 2021-02-08 DIAGNOSIS — E785 Hyperlipidemia, unspecified: Secondary | ICD-10-CM

## 2021-02-08 DIAGNOSIS — K2100 Gastro-esophageal reflux disease with esophagitis, without bleeding: Secondary | ICD-10-CM

## 2021-02-08 DIAGNOSIS — I152 Hypertension secondary to endocrine disorders: Secondary | ICD-10-CM

## 2021-02-08 DIAGNOSIS — E1169 Type 2 diabetes mellitus with other specified complication: Secondary | ICD-10-CM

## 2021-02-08 LAB — BAYER DCA HB A1C WAIVED: HB A1C (BAYER DCA - WAIVED): 7.2 % — ABNORMAL HIGH (ref <7.0)

## 2021-02-08 MED ORDER — JANUMET 50-1000 MG PO TABS: 1.0000 | ORAL_TABLET | Freq: Two times a day (BID) | ORAL | 3 refills | Status: DC

## 2021-02-08 MED ORDER — LISINOPRIL-HYDROCHLOROTHIAZIDE 20-12.5 MG PO TABS
1.0000 | ORAL_TABLET | Freq: Every day | ORAL | 3 refills | Status: DC
Start: 2021-02-08 — End: 2021-07-09

## 2021-02-08 MED ORDER — OMEPRAZOLE 40 MG PO CPDR: 40.0000 mg | DELAYED_RELEASE_CAPSULE | Freq: Every day | ORAL | 3 refills | Status: DC

## 2021-02-08 MED ORDER — GLIMEPIRIDE 4 MG PO TABS: ORAL_TABLET | ORAL | 0 refills | Status: DC

## 2021-02-08 NOTE — Progress Notes (Signed)
BP 128/76   Pulse 73   Ht '5\' 5"'  (1.651 m)   Wt 195 lb (88.5 kg)   SpO2 98%   BMI 32.45 kg/m    Subjective:   Patient ID: Austin Ortiz, male    DOB: 10/18/1951, 70 y.o.   MRN: [id number]  HPI: Austin Ortiz is a 70 y.o. male presenting on 02/08/2021 for Medical Management of Chronic Issues and Diabetes   HPI Type 2 diabetes mellitus Patient comes in today for recheck of his diabetes. Patient has been currently taking Janumet and [place] and glimepiride. Patient is currently on an ACE inhibitor/ARB. Patient has not seen an ophthalmologist this year. Patient denies any issues with their feet. The symptom started onset as an adult hypertension and hyperlipidemia ARE RELATED TO DM   Hypertension Patient is currently on lisinopril hydrochlorothiazide, and their blood pressure today is 128/76. Patient denies any lightheadedness or dizziness. Patient denies headaches, blurred vision, chest pains, shortness of breath, or weakness. Denies any side effects from medication and is content with current medication.   Hyperlipidemia Patient is coming in for recheck of his hyperlipidemia. The patient is currently taking simvastatin. They deny any issues with myalgias or history of liver damage from it. They deny any focal numbness or weakness or chest pain.   Relevant past medical, surgical, family and social history reviewed and updated as indicated. Interim medical history since our last visit reviewed. Allergies and medications reviewed and updated.  Review of Systems  Constitutional: Negative for chills and fever.  Eyes: Negative for visual disturbance.  Respiratory: Negative for shortness of breath and wheezing.   Cardiovascular: Negative for chest pain and leg swelling.  Musculoskeletal: Negative for back pain and gait problem.  Skin: Negative for rash.  Neurological: Negative for dizziness, weakness and numbness.  All other systems reviewed and are negative.   Per HPI unless  specifically indicated above   Allergies as of 02/08/2021      Reactions   Cetuximab       Medication List       Accurate as of February 08, 2021  8:22 AM. If you have any questions, ask your nurse or doctor.        aspirin 81 MG tablet Take 81 mg by mouth daily.   dapagliflozin propanediol 10 MG Tabs tablet Commonly known as: Farxiga Take 1 tablet (10 mg total) by mouth daily.   Fish Oil 600 MG Caps Take 3,000 mg by mouth daily.   glimepiride 4 MG tablet Commonly known as: AMARYL Take 1 tablet by mouth once daily with breakfast   Janumet 50-1000 MG tablet Generic drug: sitaGLIPtin-metformin TAKE 1 TABLET BY MOUTH TWICE DAILY WITH A MEAL   lisinopril-hydrochlorothiazide 20-12.5 MG tablet Commonly known as: ZESTORETIC Take 1 tablet by mouth once daily   meloxicam 15 MG tablet Commonly known as: MOBIC Take 1 tablet (15 mg total) by mouth daily.   multivitamin tablet Take 1 tablet by mouth daily.   omeprazole 40 MG capsule Commonly known as: PRILOSEC Take 1 capsule by mouth once daily at bedtime   sildenafil 20 MG tablet Commonly known as: REVATIO Take 1-3 tablets (20-60 mg total) by mouth as needed.   simvastatin 40 MG tablet Commonly known as: ZOCOR Take 1 tablet (40 mg total) by mouth every evening.        Objective:   BP 128/76   Pulse 73   Ht '5\' 5"'  (1.651 m)   Wt 195 lb (88.5 kg)   SpO2  98%   BMI 32.45 kg/m   Wt Readings from Last 3 Encounters:  02/08/21 195 lb (88.5 kg)  01/22/21 195 lb (88.5 kg)  01/08/21 195 lb (88.5 kg)    Physical Exam Vitals and nursing note reviewed.  Constitutional:      General: He is not in acute distress.    Appearance: He is well-developed and well-nourished. He is not diaphoretic.  Eyes:     General: No scleral icterus.    Extraocular Movements: EOM normal.     Conjunctiva/sclera: Conjunctivae normal.  Neck:     Thyroid: No thyromegaly.  Cardiovascular:     Rate and Rhythm: Normal rate and regular  rhythm.     Pulses: Intact distal pulses.     Heart sounds: Normal heart sounds. No murmur heard.   Pulmonary:     Effort: Pulmonary effort is normal. No respiratory distress.     Breath sounds: Normal breath sounds. No wheezing.  Musculoskeletal:        General: No edema. Normal range of motion.     Cervical back: Neck supple.  Lymphadenopathy:     Cervical: No cervical adenopathy.  Skin:    General: Skin is warm and dry.     Findings: No rash.  Neurological:     Mental Status: He is alert and oriented to person, place, and time.     Coordination: Coordination normal.  Psychiatric:        Mood and Affect: Mood and affect normal.        Behavior: Behavior normal.       Assessment & Plan:   Problem List Items Addressed This Visit      Cardiovascular and Mediastinum   Hypertension associated with diabetes ([place])   Relevant Medications   lisinopril-hydrochlorothiazide (ZESTORETIC) 20-12.5 MG tablet   glimepiride (AMARYL) 4 MG tablet   sitaGLIPtin-metformin (JANUMET) 50-1000 MG tablet   Other Relevant Orders   Bayer DCA Hb A1c Waived   Lipid panel   CBC with Differential/Platelet   CMP14+EGFR     Digestive   GERD (gastroesophageal reflux disease)   Relevant Medications   omeprazole (PRILOSEC) 40 MG capsule     Endocrine   T2DM (type 2 diabetes mellitus) (HCC) - Primary   Relevant Medications   lisinopril-hydrochlorothiazide (ZESTORETIC) 20-12.5 MG tablet   glimepiride (AMARYL) 4 MG tablet   sitaGLIPtin-metformin (JANUMET) 50-1000 MG tablet   Other Relevant Orders   Bayer DCA Hb A1c Waived   Lipid panel   CBC with Differential/Platelet   CMP14+EGFR   Hyperlipidemia associated with type 2 diabetes mellitus (HCC)   Relevant Medications   lisinopril-hydrochlorothiazide (ZESTORETIC) 20-12.5 MG tablet   glimepiride (AMARYL) 4 MG tablet   sitaGLIPtin-metformin (JANUMET) 50-1000 MG tablet    Other Visit Diagnoses    Hyperlipidemia, unspecified hyperlipidemia type        Relevant Medications   lisinopril-hydrochlorothiazide (ZESTORETIC) 20-12.5 MG tablet   Other Relevant Orders   Bayer DCA Hb A1c Waived   Lipid panel   CBC with Differential/Platelet   CMP14+EGFR      Patient's A1c is improved to 7.2 but still slightly up, no change in medicine just continue to focus on diet at this point, seems to be improving. Follow up plan: Return in about 3 months (around 05/08/2021), or if symptoms worsen or fail to improve, for Diabetes and hypertension and cholesterol.  Counseling provided for all of the vaccine components Orders Placed This Encounter  Procedures  . Bayer DCA Hb A1c Waived  .  Lipid panel  . CBC with Differential/Platelet  . [place] Nakita Santerre, MD [facility] 02/08/2021, 8:22 AM

## 2021-02-09 LAB — CBC WITH DIFFERENTIAL/PLATELET
Basophils Absolute: 0 10*3/uL (ref 0.0–0.2)
Basos: 0 %
EOS (ABSOLUTE): 0.2 10*3/uL (ref 0.0–0.4)
Eos: 3 %
Hematocrit: 47.9 % (ref 37.5–51.0)
Hemoglobin: 15.4 g/dL (ref 13.0–17.7)
Immature Grans (Abs): 0 10*3/uL (ref 0.0–0.1)
Immature Granulocytes: 0 %
Lymphocytes Absolute: 2.4 10*3/uL (ref 0.7–3.1)
Lymphs: 30 %
MCH: 29.4 pg (ref 26.6–33.0)
MCHC: 32.2 g/dL (ref 31.5–35.7)
MCV: 91 fL (ref 79–97)
Monocytes Absolute: 0.7 10*3/uL (ref 0.1–0.9)
Monocytes: 9 %
Neutrophils Absolute: 4.6 10*3/uL (ref 1.4–7.0)
Neutrophils: 58 %
Platelets: 277 10*3/uL (ref 150–450)
RBC: 5.24 x10E6/uL (ref 4.14–5.80)
RDW: 12.9 % (ref 11.6–15.4)
WBC: 7.9 10*3/uL (ref 3.4–10.8)

## 2021-02-09 LAB — LIPID PANEL
Chol/HDL Ratio: 4.7 ratio (ref 0.0–5.0)
Cholesterol, Total: 136 mg/dL (ref 100–199)
HDL: 29 mg/dL — ABNORMAL LOW (ref >39)
LDL Chol Calc (NIH): 48 mg/dL (ref 0–99)
Triglycerides: 392 mg/dL — ABNORMAL HIGH (ref 0–149)
VLDL Cholesterol Cal: 59 mg/dL — ABNORMAL HIGH (ref 5–40)

## 2021-02-09 LAB — CMP14+EGFR
ALT: 34 IU/L (ref 0–44)
AST: 24 IU/L (ref 0–40)
Albumin/Globulin Ratio: 1.7 (ref 1.2–2.2)
Albumin: 4.5 g/dL (ref 3.8–4.8)
Alkaline Phosphatase: 44 IU/L (ref 44–121)
BUN/Creatinine Ratio: 19 (ref 10–24)
BUN: 18 mg/dL (ref 8–27)
Bilirubin Total: 0.3 mg/dL (ref 0.0–1.2)
CO2: 19 mmol/L — ABNORMAL LOW (ref 20–29)
Calcium: 9.4 mg/dL (ref 8.6–10.2)
Chloride: 100 mmol/L (ref 96–106)
Creatinine, Ser: 0.96 mg/dL (ref 0.76–1.27)
GFR calc Af Amer: 94 mL/min/{1.73_m2} (ref >59)
GFR calc non Af Amer: 81 mL/min/{1.73_m2} (ref >59)
Globulin, Total: 2.7 g/dL (ref 1.5–4.5)
Glucose: 167 mg/dL — ABNORMAL HIGH (ref 65–99)
Potassium: 4.9 mmol/L (ref 3.5–5.2)
Sodium: 137 mmol/L (ref 134–144)
Total Protein: 7.2 g/dL (ref 6.0–8.5)

## 2021-02-10 ENCOUNTER — Other Ambulatory Visit: Payer: Self-pay

## 2021-02-10 ENCOUNTER — Ambulatory Visit: Payer: Medicare HMO | Attending: Otolaryngology | Admitting: Neurology

## 2021-02-10 DIAGNOSIS — G4733 Obstructive sleep apnea (adult) (pediatric): Secondary | ICD-10-CM | POA: Insufficient documentation

## 2021-02-10 DIAGNOSIS — G4730 Sleep apnea, unspecified: Secondary | ICD-10-CM

## 2021-02-11 ENCOUNTER — Encounter: Payer: Self-pay | Admitting: Family Medicine

## 2021-02-18 NOTE — Procedures (Signed)
  [place] A. Merlene Laughter, MD     [email or web address]              HOME SLEEP STUDY  LOCATION: [facility]  Patient Name: Austin Ortiz, Austin Ortiz Date: 02/10/2021 Gender: Male D.O.B: 1951/11/03 Age (years): 69 Referring Provider: Melida Quitter Height (inches): 37 Interpreting Physician: Phillips Odor MD, ABSM Weight (lbs): 198 RPSGT: Peak, Robert BMI: 33 MRN: [id number] Neck Size: 18.00 CLINICAL INFORMATION Sleep Study Type: HST     Indication for sleep study: N/A     Epworth Sleepiness Score: N/A  SLEEP STUDY TECHNIQUE A multi-channel overnight portable sleep study was performed. The channels recorded were: nasal airflow, thoracic respiratory movement, and oxygen saturation with a pulse oximetry. Snoring was also monitored.  MEDICATIONS Patient self administered medications include: N/A.  Current Outpatient Medications:  .  aspirin 81 MG tablet, Take 81 mg by mouth daily., Disp: , Rfl:  .  dapagliflozin propanediol (FARXIGA) 10 MG TABS tablet, Take 1 tablet (10 mg total) by mouth daily., Disp: 90 tablet, Rfl: 3 .  glimepiride (AMARYL) 4 MG tablet, Take 1 tablet by mouth once daily with breakfast, Disp: 90 tablet, Rfl: 0 .  lisinopril-hydrochlorothiazide (ZESTORETIC) 20-12.5 MG tablet, Take 1 tablet by mouth daily., Disp: 90 tablet, Rfl: 3 .  meloxicam (MOBIC) 15 MG tablet, Take 1 tablet (15 mg total) by mouth daily., Disp: 90 tablet, Rfl: 3 .  Multiple Vitamin (MULTIVITAMIN) tablet, Take 1 tablet by mouth daily., Disp: , Rfl:  .  Omega-3 Fatty Acids (FISH OIL) 600 MG CAPS, Take 3,000 mg by mouth daily. , Disp: , Rfl:  .  omeprazole (PRILOSEC) 40 MG capsule, Take 1 capsule (40 mg total) by mouth daily., Disp: 90 capsule, Rfl: 3 .  sildenafil (REVATIO) 20 MG tablet, Take 1-3 tablets (20-60 mg total) by mouth as needed., Disp: 30 tablet, Rfl: 1 .  simvastatin (ZOCOR) 40 MG tablet, Take 1 tablet (40 mg total) by mouth every evening., Disp: 90 tablet, Rfl:  3 .  sitaGLIPtin-metformin (JANUMET) 50-1000 MG tablet, Take 1 tablet by mouth 2 (two) times daily with a meal. TAKE 1 TABLET BY MOUTH TWICE DAILY WITH A MEAL, Disp: 180 tablet, Rfl: 3   SLEEP ARCHITECTURE Patient was studied for 406.5 minutes. The sleep efficiency was 83.5 % and the patient was supine for 77.6%. The arousal index was 0.0 per hour.  RESPIRATORY PARAMETERS The overall AHI was 18.3 per hour, with a central apnea index of 1.5 per hour.  The oxygen nadir was 85% during sleep.     CARDIAC DATA Mean heart rate during sleep was 65.8 bpm.  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  AutoPAP 8-14 is recommended.   Delano Metz, MD Diplomate, American Board of Sleep Medicine.  ELECTRONICALLY SIGNED ON:  02/18/2021, 4:30 PM [place] PH: [phone]   FX: [phone] [place]

## 2021-03-03 ENCOUNTER — Ambulatory Visit (INDEPENDENT_AMBULATORY_CARE_PROVIDER_SITE_OTHER): Payer: Medicare HMO

## 2021-03-03 DIAGNOSIS — Z0000 Encounter for general adult medical examination without abnormal findings: Secondary | ICD-10-CM | POA: Diagnosis not present

## 2021-03-03 NOTE — Progress Notes (Signed)
MEDICARE ANNUAL WELLNESS VISIT  03/03/2021  Telephone Visit Disclaimer This Medicare AWV was conducted by telephone due to national recommendations for restrictions regarding the COVID-19 Pandemic (e.g. social distancing).  I verified, using two identifiers, that I am speaking with Austin Ortiz or their authorized healthcare agent. I discussed the limitations, risks, security, and privacy concerns of performing an evaluation and management service by telephone and the potential availability of an in-person appointment in the future. The patient expressed understanding and agreed to proceed.  Location of Patient: Home Location of Rindi Beechy (nurse):  [facility]  Subjective:    Austin Ortiz is a 70 y.o. male patient of Dettinger, Fransisca Kaufmann, MD who had a Medicare Annual Wellness Visit today via telephone. Austin Ortiz is Retired and lives with his spouse. He has three children, five grandchildren and expecting one more. He reports that he is socially active and does interact with friends/family regularly. 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  Patient Care Team: Dettinger, Fransisca Kaufmann, MD as PCP - General (Family Medicine)  Advanced Directives 03/03/2021 07/05/2019 07/03/2018 08/04/2014  Does Patient Have a Medical Advance Directive? No No No Patient has advance directive, copy not in chart  Type of Advance Directive - - - Living will  Would patient like information on creating a medical advance directive? No - Patient declined No - Patient declined No - Patient declined -  Pre-existing out of facility DNR order (yellow form or pink MOST form) - - - No    Hospital Utilization Over the Past 12 Months: # of hospitalizations or ER visits: 0 # of surgeries: 0  Review of Systems    Patient reports that his overall health is unchanged compared to last year.  History obtained from chart review and the patient  Patient Reported Readings (BP, Pulse,  CBG, Weight, etc) none  Pain Assessment Pain : No/denies pain Pain Score: 0-No pain     Current Medications & Allergies (verified) Allergies as of 03/03/2021      Reactions   Cetuximab       Medication List       Accurate as of March 03, 2021  4:15 PM. If you have any questions, ask your nurse or doctor.        aspirin 81 MG tablet Take 81 mg by mouth daily.   dapagliflozin propanediol 10 MG Tabs tablet Commonly known as: Farxiga Take 1 tablet (10 mg total) by mouth daily.   Fish Oil 600 MG Caps Take 3,000 mg by mouth daily.   glimepiride 4 MG tablet Commonly known as: AMARYL Take 1 tablet by mouth once daily with breakfast   Janumet 50-1000 MG tablet Generic drug: sitaGLIPtin-metformin Take 1 tablet by mouth 2 (two) times daily with a meal. TAKE 1 TABLET BY MOUTH TWICE DAILY WITH A MEAL   lisinopril-hydrochlorothiazide 20-12.5 MG tablet Commonly known as: ZESTORETIC Take 1 tablet by mouth daily.   meloxicam 15 MG tablet Commonly known as: MOBIC Take 1 tablet (15 mg total) by mouth daily.   multivitamin tablet Take 1 tablet by mouth daily.   omeprazole 40 MG capsule Commonly known as: PRILOSEC Take 1 capsule (40 mg total) by mouth daily.   sildenafil 20 MG tablet Commonly known as: REVATIO Take 1-3 tablets (20-60 mg total) by mouth as needed.   simvastatin 40 MG tablet Commonly known as: ZOCOR Take 1 tablet (40 mg total) by mouth every evening.  History (reviewed): Past Medical History:  Diagnosis Date  . Diabetes mellitus   . Esophageal stricture   . Foreign body in subcutaneous tissue    metal off of a machine, epigastric abd. area  . Hyperlipidemia   . Hypertension   . Kidney stones   . Obstructive sleep apnea 05/28/2013   NO cpap use per pt   Past Surgical History:  Procedure Laterality Date  . COLONOSCOPY  10/14/2010   [facility]  . ESOPHAGUS SURGERY  2010   had esophagus stretched  . [place]  . neg hx    .  UPPER GASTROINTESTINAL ENDOSCOPY  04/22/2013   Olevia Perches   Family History  Problem Relation Age of Onset  . Heart disease Mother   . Hypertension Mother   . Diabetes type II Paternal Aunt        x2  . Nephrolithiasis Maternal Aunt   . Breast cancer Maternal Aunt   . Heart attack Father        x 2  . Diabetes Maternal Aunt   . Diabetes Cousin        mat. side  . Prostate cancer Brother   . Colon cancer Neg Hx   . Colon polyps Neg Hx   . Esophageal cancer Neg Hx   . Rectal cancer Neg Hx   . Stomach cancer Neg Hx    Social History   Socioeconomic History  . Marital status: Married    Spouse name: [name]  . Number of children: 3  . Years of education: 52  . Highest education level: High school graduate  Occupational History  . Occupation: self employed- farmer  Tobacco Use  . Smoking status: Never Smoker  . Smokeless tobacco: Never Used  Vaping Use  . Vaping Use: Never used  Substance and Sexual Activity  . Alcohol use: No  . Drug use: No  . Sexual activity: Yes  Other Topics Concern  . Not on file  Social History Narrative  . Not on file   Social Determinants of Health   Financial Resource Strain: Not on file  Food Insecurity: Not on file  Transportation Needs: Not on file  Physical Activity: Not on file  Stress: Not on file  Social Connections: Not on file    Activities of Daily Living In your present state of health, do you have any difficulty performing the following activities: 03/03/2021  Hearing? N  Vision? N  Difficulty concentrating or making decisions? N  Walking or climbing stairs? N  Dressing or bathing? N  Doing errands, shopping? N  Preparing Food and eating ? N  Using the Toilet? N  In the past six months, have you accidently leaked urine? N  Do you have problems with loss of bowel control? N  Managing your Medications? N  Managing your Finances? N  Housekeeping or managing your Housekeeping? N  Some recent data might be hidden     Patient Education/ Literacy How often do you need to have someone help you when you read instructions, pamphlets, or other written materials from your doctor or pharmacy?: 1 - Never  Exercise Current Exercise Habits: The patient does not participate in regular exercise at present, Exercise limited by: None identified  Diet Patient reports consuming 3 meals a day and 2 snack(s) a day Patient reports that his primary diet is: Regular Patient reports that he does have regular access to food.   Depression Screen PHQ 2/9 Scores 03/03/2021 02/08/2021 11/05/2020 08/03/2020 04/30/2020 02/18/2020  01/30/2020  PHQ - 2 Score 0 0 0 0 0 0 0     Fall Risk Fall Risk  03/03/2021 02/08/2021 11/05/2020 08/03/2020 04/30/2020  Falls in the past year? 0 0 0 0 0  Number falls in past yr: - - - - -  Injury with Fall? - - - - -  Follow up Falls evaluation completed - - - -     Objective:  Austin Ortiz seemed alert and oriented and he participated appropriately during our telephone visit.  Blood Pressure Weight BMI  BP Readings from Last 3 Encounters:  02/08/21 128/76  01/22/21 124/71  11/05/20 (!) 146/70   Wt Readings from Last 3 Encounters:  02/08/21 195 lb (88.5 kg)  01/22/21 195 lb (88.5 kg)  01/08/21 195 lb (88.5 kg)   BMI Readings from Last 1 Encounters:  02/08/21 32.45 kg/m    *Unable to obtain current vital signs, weight, and BMI due to telephone visit type  Hearing/Vision  . Austin Ortiz did not seem to have difficulty with hearing/understanding during the telephone conversation . Reports that he has not had a formal eye exam by an eye care professional within the past year . Reports that he has not had a formal hearing evaluation within the past year *Unable to fully assess hearing and vision during telephone visit type  Cognitive Function: 6CIT Screen 03/03/2021 07/05/2019  What Year? 0 points 0 points  What month? 0 points 0 points  What time? 0 points 0 points  Count back from 20 0 points 0  points  Months in reverse 0 points 0 points  Repeat phrase 0 points 2 points  Total Score 0 2   (Normal:0-7, Significant for Dysfunction: >8)  Normal Cognitive Function Screening: Yes   Immunization & Health Maintenance Record Immunization History  Administered Date(s) Administered  . Fluad Quad(high Dose 65+) 10/21/2019  . Influenza, High Dose Seasonal PF 11/27/2017  . Influenza,inj,Quad PF,6+ Mos 09/25/2013, 10/01/2016  . Influenza-Unspecified 09/26/2015, 11/30/2018, 10/06/2020  . Moderna Sars-Covid-2 Vaccination 01/16/2020, 02/21/2020, 10/31/2020  . Pneumococcal Conjugate-13 12/13/2013  . Pneumococcal Polysaccharide-23 07/03/2018  . Tdap 03/29/2012  . Zoster 07/15/2015    Health Maintenance  Topic Date Due  . FOOT EXAM  04/30/2021  . OPHTHALMOLOGY EXAM  07/08/2021  . HEMOGLOBIN A1C  08/08/2021  . TETANUS/TDAP  04/08/2022  . COLONOSCOPY (Pts 45-32yrs Insurance coverage will need to be confirmed)  01/23/2024  . INFLUENZA VACCINE  Completed  . COVID-19 Vaccine  Completed  . Hepatitis C Screening  Completed  . PNA vac Low Risk Adult  Completed  . HPV VACCINES  Aged Out  . COLON CANCER SCREENING ANNUAL FOBT  Discontinued       Assessment  This is a routine wellness examination for Austin Ortiz.  Health Maintenance: Due or Overdue There are no preventive care reminders to display for this patient.  Austin Ortiz does not need a referral for Community Assistance: Care Management:   no Social Work:    no Prescription Assistance:  no Nutrition/Diabetes Education:  no   Plan:  Personalized Goals Goals Addressed            This Visit's Progress   . Patient Stated       03/03/2021 AWV Goal: Exercise for General Health   Patient will verbalize understanding of the benefits of increased physical activity:  Exercising regularly is important. It will improve your overall fitness, flexibility, and endurance.  Regular exercise also will improve your overall  health. It can help  you control your weight, reduce stress, and improve your bone density.  Over the next year, patient will increase physical activity as tolerated with a goal of at least 150 minutes of moderate physical activity per week.   You can tell that you are exercising at a moderate intensity if your heart starts beating faster and you start breathing faster but can still hold a conversation.  Moderate-intensity exercise ideas include:  Walking 1 mile (1.6 km) in about 15 minutes  Biking  Hiking  Golfing  Dancing  Water aerobics  Patient will verbalize understanding of everyday activities that increase physical activity by providing examples like the following: ? Yard work, such as: ? Pushing a Conservation officer, nature ? Raking and bagging leaves ? Washing your car ? Pushing a stroller ? Shoveling snow ? Gardening ? Washing windows or floors  Patient will be able to explain general safety guidelines for exercising:   Before you start a new exercise program, talk with your health care Austin Ortiz.  Do not exercise so much that you hurt yourself, feel dizzy, or get very short of breath.  Wear comfortable clothes and wear shoes with good support.  Drink plenty of water while you exercise to prevent dehydration or heat stroke.  Work out until your breathing and your heartbeat get faster.       Personalized Health Maintenance & Screening Recommendations  Shingrix vaccine  Lung Cancer Screening Recommended: no (Low Dose CT Chest recommended if Age 30-80 years, 30 pack-year currently smoking OR have quit w/in past 15 years) Hepatitis C Screening recommended: no HIV Screening recommended: no  Advanced Directives: Written information was not prepared per patient's request.  Referrals & Orders No orders of the defined types were placed in this encounter.   Follow-up Plan . Follow-up with Dettinger, Fransisca Kaufmann, MD as planned     I have personally reviewed and noted the  following in the patient's chart:   . Medical and social history . Use of alcohol, tobacco or illicit drugs  . Current medications and supplements . Functional ability and status . Nutritional status . Physical activity . Advanced directives . List of other physicians . Hospitalizations, surgeries, and ER visits in previous 12 months . Vitals . Screenings to include cognitive, depression, and falls . Referrals and appointments  In addition, I have reviewed and discussed with Austin Ortiz certain preventive protocols, quality metrics, and best practice recommendations. A written personalized care plan for preventive services as well as general preventive health recommendations is available and can be mailed to the patient at his request.      Felicity Coyer, LPN   06/28/2594   Patient declined after visit summary

## 2021-03-24 ENCOUNTER — Other Ambulatory Visit: Payer: Self-pay | Admitting: Otolaryngology

## 2021-03-25 ENCOUNTER — Other Ambulatory Visit: Payer: Self-pay

## 2021-03-25 ENCOUNTER — Encounter (HOSPITAL_BASED_OUTPATIENT_CLINIC_OR_DEPARTMENT_OTHER): Payer: Self-pay | Admitting: Otolaryngology

## 2021-03-29 ENCOUNTER — Encounter (HOSPITAL_BASED_OUTPATIENT_CLINIC_OR_DEPARTMENT_OTHER)
Admission: RE | Admit: 2021-03-29 | Discharge: 2021-03-29 | Disposition: A | Discharge status: Home / Self Care | Payer: Medicare HMO | Source: Ambulatory Visit | Attending: Otolaryngology | Admitting: Otolaryngology

## 2021-03-29 ENCOUNTER — Other Ambulatory Visit (HOSPITAL_COMMUNITY)
Admission: RE | Admit: 2021-03-29 | Discharge: 2021-03-29 | Disposition: A | Discharge status: Home / Self Care | Payer: Medicare HMO | Source: Ambulatory Visit | Attending: Otolaryngology | Admitting: Otolaryngology

## 2021-03-29 DIAGNOSIS — Z01818 Encounter for other preprocedural examination: Secondary | ICD-10-CM | POA: Diagnosis not present

## 2021-03-29 DIAGNOSIS — Z20822 Contact with and (suspected) exposure to covid-19: Secondary | ICD-10-CM | POA: Insufficient documentation

## 2021-03-29 DIAGNOSIS — Z01812 Encounter for preprocedural laboratory examination: Secondary | ICD-10-CM | POA: Insufficient documentation

## 2021-03-29 LAB — BASIC METABOLIC PANEL
Anion gap: 9 (ref 5–15)
BUN: 24 mg/dL — ABNORMAL HIGH (ref 8–23)
CO2: 25 mmol/L (ref 22–32)
Calcium: 9.3 mg/dL (ref 8.9–10.3)
Chloride: 102 mmol/L (ref 98–111)
Creatinine, Ser: 0.85 mg/dL (ref 0.61–1.24)
GFR, Estimated: >60 mL/min (ref >60)
Glucose, Bld: 175 mg/dL — ABNORMAL HIGH (ref 70–99)
Potassium: 5.3 mmol/L — ABNORMAL HIGH (ref 3.5–5.1)
Sodium: 136 mmol/L (ref 135–145)

## 2021-03-29 LAB — SARS CORONAVIRUS 2 (TAT 6-24 HRS): SARS Coronavirus 2: NEGATIVE

## 2021-03-31 ENCOUNTER — Other Ambulatory Visit: Payer: Self-pay

## 2021-03-31 ENCOUNTER — Encounter (HOSPITAL_BASED_OUTPATIENT_CLINIC_OR_DEPARTMENT_OTHER)
Admission: RE | Disposition: A | Discharge status: Home / Self Care | Payer: Self-pay | Source: Home / Self Care | Attending: Otolaryngology

## 2021-03-31 ENCOUNTER — Ambulatory Visit (HOSPITAL_BASED_OUTPATIENT_CLINIC_OR_DEPARTMENT_OTHER): Payer: Medicare HMO | Admitting: Anesthesiology

## 2021-03-31 ENCOUNTER — Encounter (HOSPITAL_BASED_OUTPATIENT_CLINIC_OR_DEPARTMENT_OTHER): Payer: Self-pay | Admitting: Otolaryngology

## 2021-03-31 ENCOUNTER — Ambulatory Visit (HOSPITAL_BASED_OUTPATIENT_CLINIC_OR_DEPARTMENT_OTHER)
Admission: RE | Admit: 2021-03-31 | Discharge: 2021-03-31 | Disposition: A | Discharge status: Home / Self Care | Payer: Medicare HMO | Attending: Otolaryngology | Admitting: Otolaryngology

## 2021-03-31 DIAGNOSIS — E119 Type 2 diabetes mellitus without complications: Secondary | ICD-10-CM | POA: Diagnosis not present

## 2021-03-31 DIAGNOSIS — Z7982 Long term (current) use of aspirin: Secondary | ICD-10-CM | POA: Insufficient documentation

## 2021-03-31 DIAGNOSIS — Z79899 Other long term (current) drug therapy: Secondary | ICD-10-CM | POA: Diagnosis not present

## 2021-03-31 DIAGNOSIS — Z791 Long term (current) use of non-steroidal anti-inflammatories (NSAID): Secondary | ICD-10-CM | POA: Insufficient documentation

## 2021-03-31 DIAGNOSIS — E785 Hyperlipidemia, unspecified: Secondary | ICD-10-CM | POA: Diagnosis not present

## 2021-03-31 DIAGNOSIS — Z8249 Family history of ischemic heart disease and other diseases of the circulatory system: Secondary | ICD-10-CM | POA: Diagnosis not present

## 2021-03-31 DIAGNOSIS — Z833 Family history of diabetes mellitus: Secondary | ICD-10-CM | POA: Insufficient documentation

## 2021-03-31 DIAGNOSIS — I10 Essential (primary) hypertension: Secondary | ICD-10-CM | POA: Diagnosis not present

## 2021-03-31 DIAGNOSIS — G4733 Obstructive sleep apnea (adult) (pediatric): Secondary | ICD-10-CM | POA: Insufficient documentation

## 2021-03-31 DIAGNOSIS — Z7984 Long term (current) use of oral hypoglycemic drugs: Secondary | ICD-10-CM | POA: Diagnosis not present

## 2021-03-31 HISTORY — PX: DRUG INDUCED ENDOSCOPY: SHX6808

## 2021-03-31 LAB — GLUCOSE, CAPILLARY
Glucose-Capillary: 155 mg/dL — ABNORMAL HIGH (ref 70–99)
Glucose-Capillary: 134 mg/dL — ABNORMAL HIGH (ref 70–99)

## 2021-03-31 SURGERY — DRUG INDUCED SLEEP ENDOSCOPY
Anesthesia: Monitor Anesthesia Care | Site: Nose

## 2021-03-31 MED ORDER — FENTANYL CITRATE (PF) 100 MCG/2ML IJ SOLN: 25.0000 ug | INTRAMUSCULAR | Status: DC | PRN

## 2021-03-31 MED ORDER — OXYMETAZOLINE HCL 0.05 % NA SOLN
NASAL | Status: AC
  Filled 2021-03-31: qty 30

## 2021-03-31 MED ORDER — ACETAMINOPHEN 500 MG PO TABS
ORAL_TABLET | ORAL | Status: AC
  Filled 2021-03-31: qty 2

## 2021-03-31 MED ORDER — OXYMETAZOLINE HCL 0.05 % NA SOLN
NASAL | Status: DC | PRN
  Administered 2021-03-31: 1 via TOPICAL

## 2021-03-31 MED ORDER — ONDANSETRON HCL 4 MG/2ML IJ SOLN: 4.0000 mg | Freq: Once | INTRAMUSCULAR | Status: DC | PRN

## 2021-03-31 MED ORDER — PROPOFOL 500 MG/50ML IV EMUL
INTRAVENOUS | Status: DC | PRN
  Administered 2021-03-31: 25 ug/kg/min via INTRAVENOUS

## 2021-03-31 MED ORDER — ACETAMINOPHEN 500 MG PO TABS
1000.0000 mg | ORAL_TABLET | Freq: Once | ORAL | Status: AC
  Administered 2021-03-31: 1000 mg via ORAL

## 2021-03-31 MED ORDER — LACTATED RINGERS IV SOLN: INTRAVENOUS | Status: DC

## 2021-03-31 SURGICAL SUPPLY — 16 items
CANISTER SUCT 1200ML W/VALVE (MISCELLANEOUS) ×3 IMPLANT
COVER WAND RF STERILE (DRAPES) IMPLANT
GLOVE SURG ENC MOIS LTX SZ7.5 (GLOVE) ×3 IMPLANT
GLOVE SURG UNDER POLY LF SZ7 (GLOVE) ×2 IMPLANT
KIT CLEAN ENDO (MISCELLANEOUS) ×3 IMPLANT
NDL PRECISIONGLIDE 27X1.5 (NEEDLE) IMPLANT
NEEDLE PRECISIONGLIDE 27X1.5 (NEEDLE) IMPLANT
PACK BASIN DAY SURGERY FS (CUSTOM PROCEDURE TRAY) ×3 IMPLANT
PATTIES SURGICAL .5 X3 (DISPOSABLE) ×3 IMPLANT
SHEET MEDIUM DRAPE 40X70 STRL (DRAPES) IMPLANT
SOL ANTI FOG 6CC (MISCELLANEOUS) ×1 IMPLANT
SOLUTION ANTI FOG 6CC (MISCELLANEOUS) ×2
SYR CONTROL 10ML LL (SYRINGE) IMPLANT
TOWEL GREEN STERILE FF (TOWEL DISPOSABLE) ×3 IMPLANT
TUBE CONNECTING 20'X1/4 (TUBING) ×1
TUBE CONNECTING 20X1/4 (TUBING) ×2 IMPLANT

## 2021-03-31 NOTE — Transfer of Care (Signed)
Immediate Anesthesia Transfer of Care Note  Patient: Austin Ortiz  Procedure(s) Performed: DRUG INDUCED ENDOSCOPY (N/A Nose)  Patient Location: PACU  Anesthesia Type:MAC  Level of Consciousness: awake, alert , oriented and patient cooperative  Airway & Oxygen Therapy: Patient Spontanous Breathing  Post-op Assessment: Report given to RN and Post -op Vital signs reviewed and stable  Post vital signs: Reviewed and stable  Last Vitals:  Vitals Value Taken Time  BP    Temp    Pulse    Resp    SpO2      Last Pain:  Vitals:   03/31/21 1058  TempSrc: Oral  PainSc: 0-No pain      Patients Stated Pain Goal: 3 (35/36/14 4315)  Complications: No complications documented.

## 2021-03-31 NOTE — Brief Op Note (Signed)
03/31/2021  12:45 PM  PATIENT:  Austin Ortiz  70 y.o. male  PRE-OPERATIVE DIAGNOSIS:  Sleep apnea  POST-OPERATIVE DIAGNOSIS: same  PROCEDURE:  Procedure(s): DRUG INDUCED ENDOSCOPY (N/A)  SURGEON:  Surgeon(s) and Role:    Melida Quitter, MD - Primary  PHYSICIAN ASSISTANT:   ASSISTANTS: none   ANESTHESIA:   IV sedation  EBL:  None   BLOOD ADMINISTERED:none  DRAINS: none   LOCAL MEDICATIONS USED:  NONE  SPECIMEN:  No Specimen  DISPOSITION OF SPECIMEN:  N/A  COUNTS:  YES  TOURNIQUET:  * No tourniquets in log *  DICTATION: .Note written in EPIC  PLAN OF CARE: Discharge to home after PACU  PATIENT DISPOSITION:  PACU - hemodynamically stable.   Delay start of Pharmacological VTE agent (>24hrs) due to surgical blood loss or risk of bleeding: no

## 2021-03-31 NOTE — Discharge Instructions (Signed)
Next dose of Tylenol can be given after 5:00PM.     Post Anesthesia Home Care Instructions  Activity: Get plenty of rest for the remainder of the day. A responsible individual must stay with you for 24 hours following the procedure.  For the next 24 hours, DO NOT: -Drive a car -Paediatric nurse -Drink alcoholic beverages -Take any medication unless instructed by your physician -Make any legal decisions or sign important papers.  Meals: Start with liquid foods such as gelatin or soup. Progress to regular foods as tolerated. Avoid greasy, spicy, heavy foods. If nausea and/or vomiting occur, drink only clear liquids until the nausea and/or vomiting subsides. Call your physician if vomiting continues.  Special Instructions/Symptoms: Your throat may feel dry or sore from the anesthesia or the breathing tube placed in your throat during surgery. If this causes discomfort, gargle with warm salt water. The discomfort should disappear within 24 hours.  If you had a scopolamine patch placed behind your ear for the management of post- operative nausea and/or vomiting:  1. The medication in the patch is effective for 72 hours, after which it should be removed.  Wrap patch in a tissue and discard in the trash. Wash hands thoroughly with soap and water. 2. You may remove the patch earlier than 72 hours if you experience unpleasant side effects which may include dry mouth, dizziness or visual disturbances. 3. Avoid touching the patch. Wash your hands with soap and water after contact with the patch.

## 2021-03-31 NOTE — Anesthesia Procedure Notes (Signed)
Procedure Name: MAC Date/Time: 03/31/2021 12:54 PM Performed by: Signe Colt, CRNA Pre-anesthesia Checklist: Patient identified, Emergency Drugs available, Suction available, Patient being monitored and Timeout performed Patient Re-evaluated:Patient Re-evaluated prior to induction Oxygen Delivery Method: Simple face mask

## 2021-03-31 NOTE — Anesthesia Preprocedure Evaluation (Addendum)
Anesthesia Evaluation  Patient identified by MRN, date of birth, ID band Patient awake    Reviewed: Allergy & Precautions, NPO status , Patient's Chart, lab work & pertinent test results  History of Anesthesia Complications Negative for: history of anesthetic complications  Airway Mallampati: III  TM Distance: <3 FB Neck ROM: Full    Dental  (+) Dental Advisory Given, Partial Upper, Partial Lower   Pulmonary sleep apnea ,    Pulmonary exam normal breath sounds clear to auscultation       Cardiovascular hypertension, Pt. on medications Normal cardiovascular exam Rhythm:Regular Rate:Normal     Neuro/Psych negative neurological ROS  negative psych ROS   GI/Hepatic Neg liver ROS, GERD  Medicated,Esophageal stricture    Endo/Other  diabetes, Type 2, Oral Hypoglycemic AgentsObesity   Renal/GU negative Renal ROS     Musculoskeletal negative musculoskeletal ROS (+)   Abdominal   Peds  Hematology negative hematology ROS (+)   Anesthesia Other Findings Day of surgery medications reviewed with the patient.  Reproductive/Obstetrics                            Anesthesia Physical Anesthesia Plan  ASA: III  Anesthesia Plan: MAC   Post-op Pain Management:    Induction: Intravenous  PONV Risk Score and Plan: 1 and Propofol infusion and Treatment may vary due to age or medical condition  Airway Management Planned: Natural Airway and Nasal Cannula  Additional Equipment:   Intra-op Plan:   Post-operative Plan:   Informed Consent: I have reviewed the patients History and Physical, chart, labs and discussed the procedure including the risks, benefits and alternatives for the proposed anesthesia with the patient or authorized representative who has indicated his/her understanding and acceptance.     Dental advisory given  Plan Discussed with: CRNA  Anesthesia Plan Comments:          Anesthesia Quick Evaluation

## 2021-03-31 NOTE — H&P (Signed)
Austin Ortiz is an 70 y.o. male.   Chief Complaint: Obstructive sleep apnea HPI: 70 year old male with obstructive sleep apnea who has had difficulty tolerating CPAP.  He presents for sleep endoscopy.  Past Medical History:  Diagnosis Date  . Diabetes mellitus   . Esophageal stricture   . Foreign body in subcutaneous tissue    metal off of a machine, epigastric abd. area  . Hyperlipidemia   . Hypertension   . Kidney stones   . Obstructive sleep apnea 05/28/2013   NO cpap use per pt    Past Surgical History:  Procedure Laterality Date  . COLONOSCOPY  10/14/2010   [facility]  . ESOPHAGUS SURGERY  2010   had esophagus stretched  . [place]  . neg hx    . UPPER GASTROINTESTINAL ENDOSCOPY  04/22/2013   Olevia Perches    Family History  Problem Relation Age of Onset  . Heart disease Mother   . Hypertension Mother   . Diabetes type II Paternal Aunt        x2  . Nephrolithiasis Maternal Aunt   . Breast cancer Maternal Aunt   . Heart attack Father        x 2  . Diabetes Maternal Aunt   . Diabetes Cousin        mat. side  . Prostate cancer Brother   . Colon cancer Neg Hx   . Colon polyps Neg Hx   . Esophageal cancer Neg Hx   . Rectal cancer Neg Hx   . Stomach cancer Neg Hx    Social History:  reports that he has never smoked. He has never used smokeless tobacco. He reports that he does not drink alcohol and does not use drugs.  Allergies:  Allergies  Allergen Reactions  . Cetuximab     Pt denies      Medications Prior to Admission  Medication Sig Dispense Refill  . aspirin 81 MG tablet Take 81 mg by mouth daily.    . dapagliflozin propanediol (FARXIGA) 10 MG TABS tablet Take 1 tablet (10 mg total) by mouth daily. 90 tablet 3  . glimepiride (AMARYL) 4 MG tablet Take 1 tablet by mouth once daily with breakfast 90 tablet 0  . lisinopril-hydrochlorothiazide (ZESTORETIC) 20-12.5 MG tablet Take 1 tablet by mouth daily. 90 tablet 3  . meloxicam (MOBIC) 15 MG  tablet Take 1 tablet (15 mg total) by mouth daily. 90 tablet 3  . Multiple Vitamin (MULTIVITAMIN) tablet Take 1 tablet by mouth daily.    . Omega-3 Fatty Acids (FISH OIL) 600 MG CAPS Take 3,000 mg by mouth daily.     [name] omeprazole (PRILOSEC) 40 MG capsule Take 1 capsule (40 mg total) by mouth daily. 90 capsule 3  . sildenafil (REVATIO) 20 MG tablet Take 1-3 tablets (20-60 mg total) by mouth as needed. 30 tablet 1  . simvastatin (ZOCOR) 40 MG tablet Take 1 tablet (40 mg total) by mouth every evening. 90 tablet 3  . sitaGLIPtin-metformin (JANUMET) 50-1000 MG tablet Take 1 tablet by mouth 2 (two) times daily with a meal. TAKE 1 TABLET BY MOUTH TWICE DAILY WITH A MEAL 180 tablet 3    Results for orders placed or performed during the hospital encounter of 03/31/21 (from the past 48 hour(s))  Glucose, capillary     Status: Abnormal   Collection Time: 03/31/21 11:08 AM  Result Value Ref Range   Glucose-Capillary 155 (H) 70 - 99 mg/dL    Comment:  Glucose reference range applies only to samples taken after fasting for at least 8 hours.   Comment 1 Document in Chart    No results found.  Review of Systems  All other systems reviewed and are negative.   Blood pressure (!) 157/64, pulse 74, temperature 98.6 F (37 C), temperature source Oral, resp. rate 18, height 5\' 5"  (1.651 m), weight 89.2 kg, SpO2 99 %. Physical Exam Constitutional:      Appearance: Normal appearance. He is normal weight.  HENT:     Head: Normocephalic and atraumatic.     Right Ear: External ear normal.     Left Ear: External ear normal.     Nose: Nose normal.     Mouth/Throat:     Mouth: Mucous membranes are moist.     Pharynx: Oropharynx is clear.  Eyes:     Extraocular Movements: Extraocular movements intact.     Conjunctiva/sclera: Conjunctivae normal.     Pupils: Pupils are equal, round, and reactive to light.  Cardiovascular:     Rate and Rhythm: Normal rate.  Pulmonary:     Effort: Pulmonary effort is normal.   Musculoskeletal:        General: Normal range of motion.     Cervical back: Normal range of motion.  Skin:    General: Skin is warm and dry.  Neurological:     General: No focal deficit present.     Mental Status: He is alert and oriented to person, place, and time.  Psychiatric:        Mood and Affect: Mood normal.        Behavior: Behavior normal.        Thought Content: Thought content normal.        Judgment: Judgment normal.      Assessment/Plan Obstructive sleep apnea  To OR for sleep endoscopy.  Melida Quitter, MD 03/31/2021, 12:18 PM

## 2021-03-31 NOTE — Anesthesia Postprocedure Evaluation (Signed)
Anesthesia Post Note  Patient: Austin Ortiz  Procedure(s) Performed: DRUG INDUCED ENDOSCOPY (N/A Nose)     Patient location during evaluation: PACU Anesthesia Type: MAC Level of consciousness: awake and alert, awake and oriented Pain management: pain level controlled Vital Signs Assessment: post-procedure vital signs reviewed and stable Respiratory status: spontaneous breathing, nonlabored ventilation, respiratory function stable and patient connected to nasal cannula oxygen Cardiovascular status: stable and blood pressure returned to baseline Postop Assessment: no apparent nausea or vomiting Anesthetic complications: no   No complications documented.  Last Vitals:  Vitals:   03/31/21 1300 03/31/21 1315  BP: 118/72 122/64  Pulse: 65 65  Resp: 18 17  Temp: 37 C 37 C  SpO2: 98% 99%    Last Pain:  Vitals:   03/31/21 1309  TempSrc:   PainSc: 0-No pain                 Catalina Gravel

## 2021-03-31 NOTE — Op Note (Signed)
Preop diagnosis: Obstructive sleep apnea Postop diagnosis: same Procedure: Drug-induced sleep endoscopy Surgeon: Redmond Baseman Anesth: IV sedation Compl: None Findings: There is 50% anterior-posterior and 50% lateral wall collapse at the velum making him a candidate for hypoglossal nerve stimulator placement.  There was also 50-50 collapse at the tongue base. Description:  After discussing risks, benefits, and alternatives, the patient was brought to the operative suite and placed on the operative table in the supine position.  Anesthesia was induced and the patient was given light sedation to simulate natural sleep. When the proper level was reached, an Afrin-soaked pledget was placed in the right nasal passage for a couple of minutes and then removed.  The fiberoptic laryngoscope was then passed to view the pharynx and larynx.  Findings are noted above and the exam was recorded.  After completion, the scope was removed and the patient was returned to anesthesia for wakeup and was moved to the recovery room in stable condition.

## 2021-04-01 ENCOUNTER — Encounter (HOSPITAL_BASED_OUTPATIENT_CLINIC_OR_DEPARTMENT_OTHER): Payer: Self-pay | Admitting: Otolaryngology

## 2021-04-01 NOTE — Addendum Note (Signed)
Addendum  created 04/01/21 1008 by Ambriana Selway, Ernesta Amble, CRNA   Charge Capture section accepted

## 2021-04-09 ENCOUNTER — Other Ambulatory Visit: Payer: Self-pay | Admitting: Otolaryngology

## 2021-04-19 ENCOUNTER — Other Ambulatory Visit: Payer: Self-pay | Admitting: Otolaryngology

## 2021-04-27 ENCOUNTER — Other Ambulatory Visit: Payer: Self-pay | Admitting: Family Medicine

## 2021-04-27 DIAGNOSIS — E1169 Type 2 diabetes mellitus with other specified complication: Secondary | ICD-10-CM

## 2021-05-03 ENCOUNTER — Encounter (HOSPITAL_BASED_OUTPATIENT_CLINIC_OR_DEPARTMENT_OTHER): Payer: Self-pay | Admitting: Otolaryngology

## 2021-05-03 ENCOUNTER — Other Ambulatory Visit: Payer: Self-pay

## 2021-05-10 ENCOUNTER — Encounter: Payer: Self-pay | Admitting: Family Medicine

## 2021-05-10 ENCOUNTER — Encounter (HOSPITAL_BASED_OUTPATIENT_CLINIC_OR_DEPARTMENT_OTHER)
Admission: RE | Admit: 2021-05-10 | Discharge: 2021-05-10 | Disposition: A | Discharge status: Home / Self Care | Payer: Medicare HMO | Source: Ambulatory Visit | Attending: Otolaryngology | Admitting: Otolaryngology

## 2021-05-10 ENCOUNTER — Other Ambulatory Visit: Payer: Self-pay

## 2021-05-10 ENCOUNTER — Ambulatory Visit (INDEPENDENT_AMBULATORY_CARE_PROVIDER_SITE_OTHER): Payer: Medicare HMO | Admitting: Family Medicine

## 2021-05-10 ENCOUNTER — Other Ambulatory Visit (HOSPITAL_COMMUNITY)
Admission: RE | Admit: 2021-05-10 | Discharge: 2021-05-10 | Disposition: A | Discharge status: Home / Self Care | Payer: Medicare HMO | Source: Ambulatory Visit | Attending: Otolaryngology | Admitting: Otolaryngology

## 2021-05-10 VITALS — BP 133/82 | HR 79 | Ht 65.0 in | Wt 195.0 lb

## 2021-05-10 DIAGNOSIS — I152 Hypertension secondary to endocrine disorders: Secondary | ICD-10-CM | POA: Diagnosis not present

## 2021-05-10 DIAGNOSIS — Z01812 Encounter for preprocedural laboratory examination: Secondary | ICD-10-CM | POA: Diagnosis not present

## 2021-05-10 DIAGNOSIS — Z20822 Contact with and (suspected) exposure to covid-19: Secondary | ICD-10-CM | POA: Insufficient documentation

## 2021-05-10 DIAGNOSIS — E785 Hyperlipidemia, unspecified: Secondary | ICD-10-CM

## 2021-05-10 DIAGNOSIS — G4733 Obstructive sleep apnea (adult) (pediatric): Secondary | ICD-10-CM | POA: Diagnosis not present

## 2021-05-10 DIAGNOSIS — E1169 Type 2 diabetes mellitus with other specified complication: Secondary | ICD-10-CM

## 2021-05-10 DIAGNOSIS — E1159 Type 2 diabetes mellitus with other circulatory complications: Secondary | ICD-10-CM

## 2021-05-10 LAB — BASIC METABOLIC PANEL
Anion gap: 9 (ref 5–15)
BUN: 15 mg/dL (ref 8–23)
CO2: 23 mmol/L (ref 22–32)
Calcium: 8.8 mg/dL — ABNORMAL LOW (ref 8.9–10.3)
Chloride: 103 mmol/L (ref 98–111)
Creatinine, Ser: 0.81 mg/dL (ref 0.61–1.24)
GFR, Estimated: >60 mL/min (ref >60)
Glucose, Bld: 249 mg/dL — ABNORMAL HIGH (ref 70–99)
Potassium: 4.8 mmol/L (ref 3.5–5.1)
Sodium: 135 mmol/L (ref 135–145)

## 2021-05-10 LAB — BAYER DCA HB A1C WAIVED: HB A1C (BAYER DCA - WAIVED): 8.5 % — ABNORMAL HIGH (ref <7.0)

## 2021-05-10 MED ORDER — JANUMET 50-1000 MG PO TABS: 1.0000 | ORAL_TABLET | Freq: Two times a day (BID) | ORAL | 3 refills | Status: DC

## 2021-05-10 MED ORDER — GLIMEPIRIDE 4 MG PO TABS: 4.0000 mg | ORAL_TABLET | Freq: Every day | ORAL | 3 refills | Status: DC

## 2021-05-10 NOTE — Progress Notes (Signed)
BP 133/82   Pulse 79   Ht 5\' 5"  (1.651 m)   Wt 195 lb (88.5 kg)   SpO2 97%   BMI 32.45 kg/m    Subjective:   Patient ID: [facility] Austin Ortiz, male    DOB: 11/30/1951, 70 y.o.   MRN: [id number]  HPI: Austin Ortiz is a 70 y.o. male presenting on 05/10/2021 for Medical Management of Chronic Issues, Diabetes, and Hypertension   HPI Type 2 diabetes mellitus Patient comes in today for recheck of his diabetes. Patient has been currently taking Janumet and [place] and glimepiride. Patient is currently on an ACE inhibitor/ARB. Patient has not seen an ophthalmologist this year. Patient denies any issues with their feet. The symptom started onset as an adult hypertension and hyperlipidemia ARE RELATED TO DM   Hypertension Patient is currently on lisinopril hydrochlorothiazide, and their blood pressure today is 133/82. Patient denies any lightheadedness or dizziness. Patient denies headaches, blurred vision, chest pains, shortness of breath, or weakness. Denies any side effects from medication and is content with current medication.   Hyperlipidemia Patient is coming in for recheck of his hyperlipidemia. The patient is currently taking simvastatin. They deny any issues with myalgias or history of liver damage from it. They deny any focal numbness or weakness or chest pain.   Sleep apnea Patient is going later this week for a sleep apnea procedure called inspire procedure which is going to put a stimulator which is supposed to help keep his airway open at night.  Relevant past medical, surgical, family and social history reviewed and updated as indicated. Interim medical history since our last visit reviewed. Allergies and medications reviewed and updated.  Review of Systems  Constitutional: Negative for chills and fever.  Eyes: Negative for visual disturbance.  Respiratory: Negative for shortness of breath and wheezing.   Cardiovascular: Negative for chest pain and leg swelling.   Musculoskeletal: Negative for back pain and gait problem.  Skin: Negative for rash.  Neurological: Negative for dizziness, weakness and light-headedness.  All other systems reviewed and are negative.   Per HPI unless specifically indicated above   Allergies as of 05/10/2021      Reactions   Cetuximab    Pt denies       Medication List       Accurate as of May 10, 2021  8:13 AM. If you have any questions, ask your nurse or doctor.        aspirin 81 MG tablet Take 81 mg by mouth daily.   dapagliflozin propanediol 10 MG Tabs tablet Commonly known as: Farxiga Take 1 tablet (10 mg total) by mouth daily.   Fish Oil 600 MG Caps Take 3,000 mg by mouth daily.   glimepiride 4 MG tablet Commonly known as: AMARYL Take 1 tablet by mouth once daily with breakfast   glucosamine-chondroitin 500-400 MG tablet Take 1 tablet by mouth 3 (three) times daily.   Janumet 50-1000 MG tablet Generic drug: sitaGLIPtin-metformin TAKE 1 TABLET BY MOUTH TWICE DAILY WITH A MEAL   lisinopril-hydrochlorothiazide 20-12.5 MG tablet Commonly known as: ZESTORETIC Take 1 tablet by mouth daily.   meloxicam 15 MG tablet Commonly known as: MOBIC Take 1 tablet (15 mg total) by mouth daily.   multivitamin tablet Take 1 tablet by mouth daily.   omeprazole 40 MG capsule Commonly known as: PRILOSEC Take 1 capsule (40 mg total) by mouth daily.   sildenafil 20 MG tablet Commonly known as: REVATIO Take 1-3 tablets (20-60 mg total) by mouth  as needed.   simvastatin 40 MG tablet Commonly known as: ZOCOR Take 1 tablet (40 mg total) by mouth every evening.        Objective:   BP 133/82   Pulse 79   Ht 5\' 5"  (1.651 m)   Wt 195 lb (88.5 kg)   SpO2 97%   BMI 32.45 kg/m   Wt Readings from Last 3 Encounters:  05/10/21 195 lb (88.5 kg)  03/31/21 196 lb 10.4 oz (89.2 kg)  02/08/21 195 lb (88.5 kg)    Physical Exam Vitals and nursing note reviewed.  Constitutional:      General: He is not  in acute distress.    Appearance: He is well-developed. He is not diaphoretic.  Eyes:     General: No scleral icterus.    Conjunctiva/sclera: Conjunctivae normal.  Neck:     Thyroid: No thyromegaly.  Cardiovascular:     Rate and Rhythm: Normal rate and regular rhythm.     Heart sounds: Normal heart sounds. No murmur heard.   Pulmonary:     Effort: Pulmonary effort is normal. No respiratory distress.     Breath sounds: Normal breath sounds. No wheezing.  Musculoskeletal:        General: No swelling. Normal range of motion.     Cervical back: Neck supple.  Lymphadenopathy:     Cervical: No cervical adenopathy.  Skin:    General: Skin is warm and dry.     Findings: No rash.  Neurological:     Mental Status: He is alert and oriented to person, place, and time.     Coordination: Coordination normal.  Psychiatric:        Behavior: Behavior normal.       Assessment & Plan:   Problem List Items Addressed This Visit      Cardiovascular and Mediastinum   Hypertension associated with diabetes ([place])   Relevant Medications   glimepiride (AMARYL) 4 MG tablet   sitaGLIPtin-metformin (JANUMET) 50-1000 MG tablet     Respiratory   Obstructive sleep apnea     Endocrine   T2DM (type 2 diabetes mellitus) ([facility]) - Primary   Relevant Medications   glimepiride (AMARYL) 4 MG tablet   sitaGLIPtin-metformin (JANUMET) 50-1000 MG tablet   Other Relevant Orders   Bayer DCA Hb A1c Waived   Hyperlipidemia associated with type 2 diabetes mellitus ([facility])   Relevant Medications   glimepiride (AMARYL) 4 MG tablet   sitaGLIPtin-metformin (JANUMET) 50-1000 MG tablet      Blood pressure under control, continue current medication.  A1c is 8.5, he says he really was missing doses and not focusing on his medicine is much and has not been checking his blood sugars and is really not focus back on and see if he can get it back down on his own, does not want to change or add medicines at this point but  will consider in the future if we need to.  He does admit he does have a couple weeks during hospitalizations or procedures where he was not taking his medicines and his sugars were running higher. Follow up plan: Return in about 3 months (around 08/10/2021), or if symptoms worsen or fail to improve, for Diabetes hypertension and cholesterol recheck..  Counseling provided for all of the vaccine components Orders Placed This Encounter  Procedures  . [facility] Hb A1c Burr, MD [facility] 05/10/2021, 8:13 AM

## 2021-05-11 LAB — SARS CORONAVIRUS 2 (TAT 6-24 HRS): SARS Coronavirus 2: NEGATIVE

## 2021-05-12 ENCOUNTER — Ambulatory Visit (HOSPITAL_BASED_OUTPATIENT_CLINIC_OR_DEPARTMENT_OTHER)
Admission: RE | Admit: 2021-05-12 | Discharge: 2021-05-12 | Disposition: A | Discharge status: Home / Self Care | Payer: Medicare HMO | Attending: Otolaryngology | Admitting: Otolaryngology

## 2021-05-12 ENCOUNTER — Other Ambulatory Visit: Payer: Self-pay

## 2021-05-12 ENCOUNTER — Ambulatory Visit (HOSPITAL_BASED_OUTPATIENT_CLINIC_OR_DEPARTMENT_OTHER): Payer: Medicare HMO | Admitting: Certified Registered"

## 2021-05-12 ENCOUNTER — Encounter (HOSPITAL_BASED_OUTPATIENT_CLINIC_OR_DEPARTMENT_OTHER): Payer: Self-pay | Admitting: Otolaryngology

## 2021-05-12 ENCOUNTER — Ambulatory Visit (HOSPITAL_COMMUNITY): Payer: Medicare HMO

## 2021-05-12 ENCOUNTER — Encounter (HOSPITAL_BASED_OUTPATIENT_CLINIC_OR_DEPARTMENT_OTHER)
Admission: RE | Disposition: A | Discharge status: Home / Self Care | Payer: Self-pay | Source: Home / Self Care | Attending: Otolaryngology

## 2021-05-12 DIAGNOSIS — Z79899 Other long term (current) drug therapy: Secondary | ICD-10-CM | POA: Diagnosis not present

## 2021-05-12 DIAGNOSIS — Z7982 Long term (current) use of aspirin: Secondary | ICD-10-CM | POA: Insufficient documentation

## 2021-05-12 DIAGNOSIS — Z7984 Long term (current) use of oral hypoglycemic drugs: Secondary | ICD-10-CM | POA: Diagnosis not present

## 2021-05-12 DIAGNOSIS — I10 Essential (primary) hypertension: Secondary | ICD-10-CM | POA: Diagnosis not present

## 2021-05-12 DIAGNOSIS — Z6831 Body mass index (BMI) 31.0-31.9, adult: Secondary | ICD-10-CM | POA: Insufficient documentation

## 2021-05-12 DIAGNOSIS — E1159 Type 2 diabetes mellitus with other circulatory complications: Secondary | ICD-10-CM | POA: Diagnosis not present

## 2021-05-12 DIAGNOSIS — G4733 Obstructive sleep apnea (adult) (pediatric): Secondary | ICD-10-CM | POA: Diagnosis not present

## 2021-05-12 DIAGNOSIS — E669 Obesity, unspecified: Secondary | ICD-10-CM | POA: Insufficient documentation

## 2021-05-12 DIAGNOSIS — Z888 Allergy status to other drugs, medicaments and biological substances status: Secondary | ICD-10-CM | POA: Insufficient documentation

## 2021-05-12 DIAGNOSIS — Z791 Long term (current) use of non-steroidal anti-inflammatories (NSAID): Secondary | ICD-10-CM | POA: Diagnosis not present

## 2021-05-12 DIAGNOSIS — K219 Gastro-esophageal reflux disease without esophagitis: Secondary | ICD-10-CM | POA: Diagnosis not present

## 2021-05-12 DIAGNOSIS — G523 Disorders of hypoglossal nerve: Secondary | ICD-10-CM | POA: Diagnosis not present

## 2021-05-12 DIAGNOSIS — Z969 Presence of functional implant, unspecified: Secondary | ICD-10-CM | POA: Diagnosis not present

## 2021-05-12 HISTORY — PX: IMPLANTATION OF HYPOGLOSSAL NERVE STIMULATOR: SHX6827

## 2021-05-12 HISTORY — DX: Gastro-esophageal reflux disease without esophagitis: K21.9

## 2021-05-12 LAB — GLUCOSE, CAPILLARY
Glucose-Capillary: 179 mg/dL — ABNORMAL HIGH (ref 70–99)
Glucose-Capillary: 230 mg/dL — ABNORMAL HIGH (ref 70–99)

## 2021-05-12 SURGERY — INSERTION, HYPOGLOSSAL NERVE STIMULATOR
Anesthesia: General | Site: Chest | Laterality: Right

## 2021-05-12 MED ORDER — LACTATED RINGERS IV SOLN: INTRAVENOUS | Status: DC

## 2021-05-12 MED ORDER — ONDANSETRON HCL 4 MG/2ML IJ SOLN
INTRAMUSCULAR | Status: AC
  Filled 2021-05-12: qty 2

## 2021-05-12 MED ORDER — CEFAZOLIN SODIUM-DEXTROSE 2-4 GM/100ML-% IV SOLN
INTRAVENOUS | Status: AC
  Filled 2021-05-12: qty 100

## 2021-05-12 MED ORDER — HYDROCODONE-ACETAMINOPHEN 5-325 MG PO TABS: 1.0000 | ORAL_TABLET | Freq: Four times a day (QID) | ORAL | 0 refills | Status: AC | PRN

## 2021-05-12 MED ORDER — PROMETHAZINE HCL 25 MG/ML IJ SOLN
6.2500 mg | INTRAMUSCULAR | Status: DC | PRN
Start: 2021-05-12 — End: 2021-05-12

## 2021-05-12 MED ORDER — SUCCINYLCHOLINE CHLORIDE 200 MG/10ML IV SOSY
PREFILLED_SYRINGE | INTRAVENOUS | Status: AC
  Filled 2021-05-12: qty 10

## 2021-05-12 MED ORDER — PHENYLEPHRINE HCL-NACL 10-0.9 MG/250ML-% IV SOLN
INTRAVENOUS | Status: DC | PRN
  Administered 2021-05-12: 40 ug/min via INTRAVENOUS

## 2021-05-12 MED ORDER — OXYCODONE HCL 5 MG PO TABS: 5.0000 mg | ORAL_TABLET | Freq: Once | ORAL | Status: DC | PRN

## 2021-05-12 MED ORDER — PROPOFOL 10 MG/ML IV BOLUS
INTRAVENOUS | Status: AC
  Filled 2021-05-12: qty 20

## 2021-05-12 MED ORDER — OXYCODONE HCL 5 MG/5ML PO SOLN: 5.0000 mg | Freq: Once | ORAL | Status: DC | PRN

## 2021-05-12 MED ORDER — LIDOCAINE-EPINEPHRINE 1 %-1:100000 IJ SOLN
INTRAMUSCULAR | Status: AC
  Filled 2021-05-12: qty 1

## 2021-05-12 MED ORDER — DEXMEDETOMIDINE (PRECEDEX) IN NS 20 MCG/5ML (4 MCG/ML) IV SYRINGE
PREFILLED_SYRINGE | INTRAVENOUS | Status: DC | PRN
  Administered 2021-05-12: 8 ug via INTRAVENOUS

## 2021-05-12 MED ORDER — FENTANYL CITRATE (PF) 100 MCG/2ML IJ SOLN
INTRAMUSCULAR | Status: AC
  Filled 2021-05-12: qty 2

## 2021-05-12 MED ORDER — PROPOFOL 500 MG/50ML IV EMUL
INTRAVENOUS | Status: AC
  Filled 2021-05-12: qty 50

## 2021-05-12 MED ORDER — ONDANSETRON HCL 4 MG/2ML IJ SOLN
INTRAMUSCULAR | Status: DC | PRN
  Administered 2021-05-12: 4 mg via INTRAVENOUS

## 2021-05-12 MED ORDER — DEXAMETHASONE SODIUM PHOSPHATE 10 MG/ML IJ SOLN
INTRAMUSCULAR | Status: AC
  Filled 2021-05-12: qty 1

## 2021-05-12 MED ORDER — PROPOFOL 500 MG/50ML IV EMUL
INTRAVENOUS | Status: AC
  Filled 2021-05-12: qty 100

## 2021-05-12 MED ORDER — LIDOCAINE HCL (CARDIAC) PF 100 MG/5ML IV SOSY
PREFILLED_SYRINGE | INTRAVENOUS | Status: DC | PRN
  Administered 2021-05-12: 60 mg via INTRAVENOUS

## 2021-05-12 MED ORDER — 0.9 % SODIUM CHLORIDE (POUR BTL) OPTIME
TOPICAL | Status: DC | PRN
  Administered 2021-05-12: 120 mL

## 2021-05-12 MED ORDER — SUCCINYLCHOLINE CHLORIDE 20 MG/ML IJ SOLN
INTRAMUSCULAR | Status: DC | PRN
  Administered 2021-05-12: 100 mg via INTRAVENOUS

## 2021-05-12 MED ORDER — ROCURONIUM BROMIDE 10 MG/ML (PF) SYRINGE
PREFILLED_SYRINGE | INTRAVENOUS | Status: AC
  Filled 2021-05-12: qty 10

## 2021-05-12 MED ORDER — LIDOCAINE 2% (20 MG/ML) 5 ML SYRINGE
INTRAMUSCULAR | Status: AC
  Filled 2021-05-12: qty 5

## 2021-05-12 MED ORDER — FENTANYL CITRATE (PF) 100 MCG/2ML IJ SOLN
INTRAMUSCULAR | Status: DC | PRN
  Administered 2021-05-12 (×3): 50 ug via INTRAVENOUS

## 2021-05-12 MED ORDER — LIDOCAINE 2% (20 MG/ML) 5 ML SYRINGE
INTRAMUSCULAR | Status: AC
  Filled 2021-05-12: qty 10

## 2021-05-12 MED ORDER — PROPOFOL 500 MG/50ML IV EMUL
INTRAVENOUS | Status: DC | PRN
  Administered 2021-05-12: 50 ug/kg/min via INTRAVENOUS

## 2021-05-12 MED ORDER — CEFAZOLIN SODIUM-DEXTROSE 2-4 GM/100ML-% IV SOLN: 2.0000 g | INTRAVENOUS | Status: DC

## 2021-05-12 MED ORDER — CEFAZOLIN SODIUM-DEXTROSE 2-4 GM/100ML-% IV SOLN
2.0000 g | INTRAVENOUS | Status: AC
  Administered 2021-05-12: 2 g via INTRAVENOUS

## 2021-05-12 MED ORDER — PHENYLEPHRINE 40 MCG/ML (10ML) SYRINGE FOR IV PUSH (FOR BLOOD PRESSURE SUPPORT)
PREFILLED_SYRINGE | INTRAVENOUS | Status: AC
  Filled 2021-05-12: qty 10

## 2021-05-12 MED ORDER — MEPERIDINE HCL 25 MG/ML IJ SOLN: 6.2500 mg | INTRAMUSCULAR | Status: DC | PRN

## 2021-05-12 MED ORDER — AMISULPRIDE (ANTIEMETIC) 5 MG/2ML IV SOLN: 10.0000 mg | Freq: Once | INTRAVENOUS | Status: DC | PRN

## 2021-05-12 MED ORDER — ONDANSETRON HCL 4 MG/2ML IJ SOLN
INTRAMUSCULAR | Status: AC
  Filled 2021-05-12: qty 4

## 2021-05-12 MED ORDER — HYDROMORPHONE HCL 1 MG/ML IJ SOLN: 0.2500 mg | INTRAMUSCULAR | Status: DC | PRN

## 2021-05-12 MED ORDER — LIDOCAINE-EPINEPHRINE 1 %-1:100000 IJ SOLN
INTRAMUSCULAR | Status: DC | PRN
  Administered 2021-05-12: 6 mL

## 2021-05-12 MED ORDER — EPHEDRINE 5 MG/ML INJ
INTRAVENOUS | Status: AC
  Filled 2021-05-12: qty 10

## 2021-05-12 SURGICAL SUPPLY — 69 items
ACC NRSTM 4 TRQ WRNCH STRL (MISCELLANEOUS)
ADH SKN CLS APL DERMABOND .7 (GAUZE/BANDAGES/DRESSINGS) ×1
BLADE CLIPPER SURG (BLADE) ×1 IMPLANT
BLADE SURG 15 STRL LF DISP TIS (BLADE) ×1 IMPLANT
BLADE SURG 15 STRL SS (BLADE) ×2
CANISTER SUCT 1200ML W/VALVE (MISCELLANEOUS) ×2 IMPLANT
CORD BIPOLAR FORCEPS 12FT (ELECTRODE) ×2 IMPLANT
COVER PROBE W GEL 5X96 (DRAPES) ×2 IMPLANT
COVER WAND RF STERILE (DRAPES) ×2 IMPLANT
DERMABOND ADVANCED (GAUZE/BANDAGES/DRESSINGS) ×1
DERMABOND ADVANCED .7 DNX12 (GAUZE/BANDAGES/DRESSINGS) ×2 IMPLANT
DRAPE C-ARM 35X43 STRL (DRAPES) ×2 IMPLANT
DRAPE HEAD BAR (DRAPES) IMPLANT
DRAPE INCISE IOBAN 66X45 STRL (DRAPES) ×2 IMPLANT
DRAPE MICROSCOPE WILD 40.5X102 (DRAPES) ×2 IMPLANT
DRAPE UTILITY XL STRL (DRAPES) ×2 IMPLANT
DRSG TEGADERM 2-3/8X2-3/4 SM (GAUZE/BANDAGES/DRESSINGS) ×2 IMPLANT
DRSG TEGADERM 4X4.75 (GAUZE/BANDAGES/DRESSINGS) ×2 IMPLANT
ELECT COATED BLADE 2.86 ST (ELECTRODE) ×2 IMPLANT
ELECT EMG 18 NIMS (NEUROSURGERY SUPPLIES) ×2
ELECT REM PT RETURN 9FT ADLT (ELECTROSURGICAL) ×2
ELECTRODE EMG 18 NIMS (NEUROSURGERY SUPPLIES) ×1 IMPLANT
ELECTRODE REM PT RTRN 9FT ADLT (ELECTROSURGICAL) ×1 IMPLANT
FORCEPS BIPOLAR SPETZLER 8 1.0 (NEUROSURGERY SUPPLIES) ×2 IMPLANT
GAUZE 4X4 16PLY RFD (DISPOSABLE) ×2 IMPLANT
GAUZE SPONGE 4X4 12PLY STRL (GAUZE/BANDAGES/DRESSINGS) ×2 IMPLANT
GENERATOR PULSE INSPIRE (Generator) ×2 IMPLANT
GENERATOR PULSE INSPIRE IV (Generator) ×1 IMPLANT
GLOVE SURG ENC MOIS LTX SZ6.5 (GLOVE) IMPLANT
GLOVE SURG ENC MOIS LTX SZ7.5 (GLOVE) ×2 IMPLANT
GLOVE SURG POLYISO LF SZ7 (GLOVE) ×1 IMPLANT
GLOVE SURG UNDER POLY LF SZ7 (GLOVE) ×1 IMPLANT
GOWN STRL REUS W/ TWL LRG LVL3 (GOWN DISPOSABLE) ×3 IMPLANT
GOWN STRL REUS W/TWL LRG LVL3 (GOWN DISPOSABLE) ×4
IV CATH 18G SAFETY (IV SOLUTION) ×2 IMPLANT
KIT NEURO ACCESSORY W/WRENCH (MISCELLANEOUS) IMPLANT
LEAD SENSING RESP INSPIRE (Lead) ×2 IMPLANT
LEAD SENSING RESP INSPIRE IV (Lead) ×1 IMPLANT
LEAD SLEEP STIM INSPIRE IV/V (Lead) ×1 IMPLANT
LEAD SLEEP STIMULATION INSPIRE (Lead) ×2 IMPLANT
LOOP VESSEL MAXI BLUE (MISCELLANEOUS) ×2 IMPLANT
LOOP VESSEL MINI RED (MISCELLANEOUS) ×2 IMPLANT
MARKER SKIN DUAL TIP RULER LAB (MISCELLANEOUS) ×4 IMPLANT
NDL HYPO 25X1 1.5 SAFETY (NEEDLE) ×1 IMPLANT
NEEDLE HYPO 25X1 1.5 SAFETY (NEEDLE) ×2 IMPLANT
NS IRRIG 1000ML POUR BTL (IV SOLUTION) ×2 IMPLANT
PACK BASIN DAY SURGERY FS (CUSTOM PROCEDURE TRAY) ×2 IMPLANT
PACK ENT DAY SURGERY (CUSTOM PROCEDURE TRAY) ×2 IMPLANT
PASSER CATH 36 CODMAN DISP (NEUROSURGERY SUPPLIES) ×1 IMPLANT
PASSER CATH 38CM DISP (INSTRUMENTS) IMPLANT
PENCIL SMOKE EVACUATOR (MISCELLANEOUS) ×2 IMPLANT
PROBE NERVE STIMULATOR (NEUROSURGERY SUPPLIES) ×2 IMPLANT
REMOTE CONTROL SLEEP INSPIRE (MISCELLANEOUS) ×2 IMPLANT
SET WALTER ACTIVATION W/DRAPE (SET/KITS/TRAYS/PACK) ×2 IMPLANT
SLEEVE SCD COMPRESS KNEE MED (STOCKING) ×2 IMPLANT
SPONGE INTESTINAL PEANUT (DISPOSABLE) ×2 IMPLANT
STAPLER VISISTAT 35W (STAPLE) IMPLANT
SUT SILK 2 0 SH (SUTURE) ×2 IMPLANT
SUT SILK 3 0 REEL (SUTURE) ×2 IMPLANT
SUT SILK 3 0 SH 30 (SUTURE) ×3 IMPLANT
SUT SILK 3-0 (SUTURE) ×4
SUT SILK 3-0 RB1 30XBRD (SUTURE) ×2
SUT VIC AB 3-0 SH 27 (SUTURE) ×4
SUT VIC AB 3-0 SH 27X BRD (SUTURE) ×1 IMPLANT
SUT VIC AB 4-0 PS2 27 (SUTURE) ×4 IMPLANT
SUTURE SILK 3-0 RB1 30XBRD (SUTURE) ×1 IMPLANT
SYR 10ML LL (SYRINGE) ×2 IMPLANT
SYR BULB EAR ULCER 3OZ GRN STR (SYRINGE) ×2 IMPLANT
TOWEL GREEN STERILE FF (TOWEL DISPOSABLE) ×4 IMPLANT

## 2021-05-12 NOTE — Op Note (Signed)

## 2021-05-12 NOTE — H&P (Signed)
Austin Ortiz is an 70 y.o. male.   Chief Complaint: Sleep apnea HPI: 70 year old male with obstructive sleep apnea who has had difficulty tolerating CPAP.  He presents for hypoglossal nerve stimulator placement.  Past Medical History:  Diagnosis Date  . Diabetes mellitus   . Esophageal stricture   . Foreign body in subcutaneous tissue    metal off of a machine, epigastric abd. area  . GERD (gastroesophageal reflux disease)   . Hyperlipidemia   . Hypertension   . Kidney stones   . Obstructive sleep apnea 05/28/2013   NO cpap use per pt    Past Surgical History:  Procedure Laterality Date  . COLONOSCOPY  10/14/2010   [place]  . DRUG INDUCED ENDOSCOPY N/A 03/31/2021   Procedure: DRUG INDUCED ENDOSCOPY;  Surgeon: Melida Quitter, MD;  Location: [facility];  Service: ENT;  Laterality: N/A;  . ESOPHAGUS SURGERY  2010   had esophagus stretched  . [place]  . neg hx    . UPPER GASTROINTESTINAL ENDOSCOPY  04/22/2013   [facility]le[facility] Perches    Family History  Problem Relation Age of Onset  . Heart disease Mother   . Hypertension Mother   . Diabetes type II Paternal Aunt        x2  . Nephrolithiasis Maternal Aunt   . Breast cancer Maternal Aunt   . Heart attack Father        x 2  . Diabetes Maternal Aunt   . Diabetes Cousin        mat. side  . Prostate cancer Brother   . Colon cancer Neg Hx   . Colon polyps Neg Hx   . Esophageal cancer Neg Hx   . Rectal cancer Neg Hx   . Stomach cancer Neg Hx    Social History:  reports that he has never smoked. He has never used smokeless tobacco. He reports that he does not drink alcohol and does not use drugs.  Allergies:  Allergies  Allergen Reactions  . Cetuximab     Pt denies      Medications Prior to Admission  Medication Sig Dispense Refill  . aspirin 81 MG tablet Take 81 mg by mouth daily.    . dapagliflozin propanediol (FARXIGA) 10 MG TABS tablet Take 1 tablet (10 mg total) by mouth daily. 90 tablet 3   . glimepiride (AMARYL) 4 MG tablet Take 1 tablet (4 mg total) by mouth daily with breakfast. 90 tablet 3  . glucosamine-chondroitin 500-400 MG tablet Take 1 tablet by mouth 3 (three) times daily.    [name] lisinopril-hydrochlorothiazide (ZESTORETIC) 20-12.5 MG tablet Take 1 tablet by mouth daily. 90 tablet 3  . meloxicam (MOBIC) 15 MG tablet Take 1 tablet (15 mg total) by mouth daily. 90 tablet 3  . Multiple Vitamin (MULTIVITAMIN) tablet Take 1 tablet by mouth daily.    . Omega-3 Fatty Acids (FISH OIL) 600 MG CAPS Take 3,000 mg by mouth daily.     [name] omeprazole (PRILOSEC) 40 MG capsule Take 1 capsule (40 mg total) by mouth daily. 90 capsule 3  . sildenafil (REVATIO) 20 MG tablet Take 1-3 tablets (20-60 mg total) by mouth as needed. 30 tablet 1  . simvastatin (ZOCOR) 40 MG tablet Take 1 tablet (40 mg total) by mouth every evening. 90 tablet 3  . sitaGLIPtin-metformin (JANUMET) 50-1000 MG tablet Take 1 tablet by mouth 2 (two) times daily with a meal. 180 tablet 3    Results for orders  placed or performed during the hospital encounter of 05/12/21 (from the past 48 hour(s))  Basic metabolic panel per protocol     Status: Abnormal   Collection Time: 05/10/21 11:00 AM  Result Value Ref Range   Sodium 135 135 - 145 mmol/L   Potassium 4.8 3.5 - 5.1 mmol/L   Chloride 103 98 - 111 mmol/L   CO2 23 22 - 32 mmol/L   Glucose, Bld 249 (H) 70 - 99 mg/dL    Comment: Glucose reference range applies only to samples taken after fasting for at least 8 hours.   BUN 15 8 - 23 mg/dL   Creatinine, Ser 0.81 0.61 - 1.24 mg/dL   Calcium 8.8 (L) 8.9 - 10.3 mg/dL   GFR, Estimated >60 >60 mL/min    Comment: (NOTE) Calculated using the CKD-EPI Creatinine Equation (2021)    Anion gap 9 5 - 15    Comment: Performed at [facility] [address], [place], [place] [address]  Glucose, capillary     Status: Abnormal   Collection Time: 05/12/21  8:26 AM  Result Value Ref Range   Glucose-Capillary 179 (H) 70 - 99  mg/dL    Comment: Glucose reference range applies only to samples taken after fasting for at least 8 hours.   No results found.  Review of Systems  All other systems reviewed and are negative.   Blood pressure 120/83, pulse 78, temperature 98.5 F (36.9 C), temperature source Oral, resp. rate 16, height 5\' 5"  (1.651 m), weight 85 kg, SpO2 100 %. Physical Exam Constitutional:      Appearance: Normal appearance. He is normal weight.  HENT:     Head: Normocephalic and atraumatic.     Right Ear: External ear normal.     Left Ear: External ear normal.     Nose: Nose normal.     Mouth/Throat:     Mouth: Mucous membranes are moist.     Pharynx: Oropharynx is clear.  Eyes:     Extraocular Movements: Extraocular movements intact.     Conjunctiva/sclera: Conjunctivae normal.     Pupils: Pupils are equal, round, and reactive to light.  Cardiovascular:     Rate and Rhythm: Normal rate.  Pulmonary:     Effort: Pulmonary effort is normal.  Musculoskeletal:        General: Normal range of motion.     Cervical back: Normal range of motion.  Skin:    General: Skin is warm and dry.  Neurological:     General: No focal deficit present.     Mental Status: He is alert and oriented to person, place, and time.  Psychiatric:        Mood and Affect: Mood normal.        Behavior: Behavior normal.        Thought Content: Thought content normal.        Judgment: Judgment normal.      Assessment/Plan Obstructive sleep apnea  To OR for placement of hypoglossal nerve stimulator.  Melida Quitter, MD 05/12/2021, 8:50 AM

## 2021-05-12 NOTE — Anesthesia Preprocedure Evaluation (Signed)
Anesthesia Evaluation  Patient identified by MRN, date of birth, ID band Patient awake    Reviewed: Allergy & Precautions, NPO status , Patient's Chart, lab work & pertinent test results  History of Anesthesia Complications Negative for: history of anesthetic complications  Airway Mallampati: III  TM Distance: <3 FB Neck ROM: Full    Dental  (+) Dental Advisory Given, Partial Upper, Partial Lower   Pulmonary sleep apnea ,    Pulmonary exam normal breath sounds clear to auscultation       Cardiovascular hypertension, Pt. on medications Normal cardiovascular exam Rhythm:Regular Rate:Normal     Neuro/Psych negative neurological ROS  negative psych ROS   GI/Hepatic Neg liver ROS, GERD  Medicated,Esophageal stricture    Endo/Other  diabetes, Type 2, Oral Hypoglycemic AgentsObesity   Renal/GU negative Renal ROS     Musculoskeletal negative musculoskeletal ROS (+)   Abdominal (+) + obese,   Peds  Hematology negative hematology ROS (+)   Anesthesia Other Findings Day of surgery medications reviewed with the patient.  Reproductive/Obstetrics                             Anesthesia Physical  Anesthesia Plan  ASA: III  Anesthesia Plan: General   Post-op Pain Management:    Induction: Intravenous  PONV Risk Score and Plan: 2 and Treatment may vary due to age or medical condition, Ondansetron and Midazolam  Airway Management Planned: Oral ETT  Additional Equipment:   Intra-op Plan:   Post-operative Plan: Extubation in OR  Informed Consent: I have reviewed the patients History and Physical, chart, labs and discussed the procedure including the risks, benefits and alternatives for the proposed anesthesia with the patient or authorized representative who has indicated his/her understanding and acceptance.     Dental advisory given  Plan Discussed with: CRNA  Anesthesia Plan  Comments:         Anesthesia Quick Evaluation

## 2021-05-12 NOTE — Anesthesia Postprocedure Evaluation (Signed)
Anesthesia Post Note  Patient: Austin Ortiz  Procedure(s) Performed: IMPLANTATION OF HYPOGLOSSAL NERVE STIMULATOR (Right Chest)     Patient location during evaluation: PACU Anesthesia Type: General Level of consciousness: awake and alert Pain management: pain level controlled Vital Signs Assessment: post-procedure vital signs reviewed and stable Respiratory status: spontaneous breathing, nonlabored ventilation and respiratory function stable Cardiovascular status: blood pressure returned to baseline and stable Postop Assessment: no apparent nausea or vomiting Anesthetic complications: no   No complications documented.  Last Vitals:  Vitals:   05/12/21 1215 05/12/21 1230  BP: 115/61 112/66  Pulse: 83 81  Resp: 16 15  Temp:    SpO2: 94% 94%    Last Pain:  Vitals:   05/12/21 1215  TempSrc:   PainSc: 0-No pain                 Lynda Rainwater

## 2021-05-12 NOTE — Anesthesia Procedure Notes (Signed)
Procedure Name: Intubation Date/Time: 05/12/2021 9:15 AM Performed by: Signe Colt, CRNA Pre-anesthesia Checklist: Patient identified, Emergency Drugs available, Suction available and Patient being monitored Patient Re-evaluated:Patient Re-evaluated prior to induction Oxygen Delivery Method: Circle system utilized Preoxygenation: Pre-oxygenation with 100% oxygen Induction Type: IV induction Ventilation: Mask ventilation without difficulty Laryngoscope Size: Mac and 3 Grade View: Grade II Tube type: Oral Tube size: 7.0 mm Number of attempts: 1 Airway Equipment and Method: Stylet and Oral airway Placement Confirmation: ETT inserted through vocal cords under direct vision,  positive ETCO2 and breath sounds checked- equal and bilateral Secured at: 22 cm Tube secured with: Tape Dental Injury: Teeth and Oropharynx as per pre-operative assessment  Difficulty Due To: Difficulty was anticipated

## 2021-05-12 NOTE — Discharge Instructions (Signed)

## 2021-05-12 NOTE — Transfer of Care (Signed)
Immediate Anesthesia Transfer of Care Note  Patient: Austin Ortiz  Procedure(s) Performed: IMPLANTATION OF HYPOGLOSSAL NERVE STIMULATOR (Right Chest)  Patient Location: PACU  Anesthesia Type:General  Level of Consciousness: drowsy and patient cooperative  Airway & Oxygen Therapy: Patient Spontanous Breathing and Patient connected to face mask oxygen  Post-op Assessment: Report given to RN and Post -op Vital signs reviewed and stable  Post vital signs: Reviewed and stable  Last Vitals:  Vitals Value Taken Time  BP    Temp    Pulse 75 05/12/21 1128  Resp    SpO2 98 % 05/12/21 1128  Vitals shown include unvalidated device data.  Last Pain:  Vitals:   05/12/21 0825  TempSrc: Oral  PainSc: 0-No pain         Complications: No complications documented.

## 2021-05-12 NOTE — Brief Op Note (Signed)
05/12/2021  11:13 AM  PATIENT:  Austin Ortiz  70 y.o. male  PRE-OPERATIVE DIAGNOSIS:  Sleep apnea  POST-OPERATIVE DIAGNOSIS:  Sleep apnea  PROCEDURE:  Procedure(s): IMPLANTATION OF HYPOGLOSSAL NERVE STIMULATOR (Right)  SURGEON:  Surgeon(s) and Role:    Melida Quitter, MD - Primary  PHYSICIAN ASSISTANT:   ASSISTANTS: none   ANESTHESIA:   general  EBL: Minimal  BLOOD ADMINISTERED:none  DRAINS: none   LOCAL MEDICATIONS USED:  LIDOCAINE   SPECIMEN:  No Specimen  DISPOSITION OF SPECIMEN:  N/A  COUNTS:  YES  TOURNIQUET:  * No tourniquets in log *  DICTATION: .Note written in EPIC  PLAN OF CARE: Discharge to home after PACU  PATIENT DISPOSITION:  PACU - hemodynamically stable.   Delay start of Pharmacological VTE agent (>24hrs) due to surgical blood loss or risk of bleeding: no

## 2021-05-17 ENCOUNTER — Encounter (HOSPITAL_BASED_OUTPATIENT_CLINIC_OR_DEPARTMENT_OTHER): Payer: Self-pay | Admitting: Otolaryngology

## 2021-06-08 DIAGNOSIS — I10 Essential (primary) hypertension: Secondary | ICD-10-CM | POA: Diagnosis not present

## 2021-06-08 DIAGNOSIS — Z7984 Long term (current) use of oral hypoglycemic drugs: Secondary | ICD-10-CM | POA: Diagnosis not present

## 2021-06-08 DIAGNOSIS — E119 Type 2 diabetes mellitus without complications: Secondary | ICD-10-CM | POA: Diagnosis not present

## 2021-06-08 DIAGNOSIS — G4733 Obstructive sleep apnea (adult) (pediatric): Secondary | ICD-10-CM | POA: Diagnosis not present

## 2021-07-06 DIAGNOSIS — I10 Essential (primary) hypertension: Secondary | ICD-10-CM | POA: Diagnosis not present

## 2021-07-06 DIAGNOSIS — G4733 Obstructive sleep apnea (adult) (pediatric): Secondary | ICD-10-CM | POA: Diagnosis not present

## 2021-07-09 ENCOUNTER — Other Ambulatory Visit: Payer: Self-pay | Admitting: Family Medicine

## 2021-07-09 DIAGNOSIS — E1169 Type 2 diabetes mellitus with other specified complication: Secondary | ICD-10-CM

## 2021-07-19 ENCOUNTER — Other Ambulatory Visit (HOSPITAL_BASED_OUTPATIENT_CLINIC_OR_DEPARTMENT_OTHER): Payer: Self-pay

## 2021-07-19 DIAGNOSIS — G4733 Obstructive sleep apnea (adult) (pediatric): Secondary | ICD-10-CM

## 2021-08-04 DIAGNOSIS — E669 Obesity, unspecified: Secondary | ICD-10-CM | POA: Diagnosis not present

## 2021-08-04 DIAGNOSIS — I10 Essential (primary) hypertension: Secondary | ICD-10-CM | POA: Diagnosis not present

## 2021-08-04 DIAGNOSIS — K08409 Partial loss of teeth, unspecified cause, unspecified class: Secondary | ICD-10-CM | POA: Diagnosis not present

## 2021-08-04 DIAGNOSIS — Z791 Long term (current) use of non-steroidal anti-inflammatories (NSAID): Secondary | ICD-10-CM | POA: Diagnosis not present

## 2021-08-04 DIAGNOSIS — E1136 Type 2 diabetes mellitus with diabetic cataract: Secondary | ICD-10-CM | POA: Diagnosis not present

## 2021-08-04 DIAGNOSIS — N529 Male erectile dysfunction, unspecified: Secondary | ICD-10-CM | POA: Diagnosis not present

## 2021-08-04 DIAGNOSIS — K219 Gastro-esophageal reflux disease without esophagitis: Secondary | ICD-10-CM | POA: Diagnosis not present

## 2021-08-04 DIAGNOSIS — E785 Hyperlipidemia, unspecified: Secondary | ICD-10-CM | POA: Diagnosis not present

## 2021-08-04 DIAGNOSIS — Z6833 Body mass index (BMI) 33.0-33.9, adult: Secondary | ICD-10-CM | POA: Diagnosis not present

## 2021-08-04 DIAGNOSIS — M1990 Unspecified osteoarthritis, unspecified site: Secondary | ICD-10-CM | POA: Diagnosis not present

## 2021-08-11 ENCOUNTER — Other Ambulatory Visit: Payer: Self-pay

## 2021-08-11 ENCOUNTER — Ambulatory Visit (INDEPENDENT_AMBULATORY_CARE_PROVIDER_SITE_OTHER): Payer: Medicare HMO | Admitting: Family Medicine

## 2021-08-11 ENCOUNTER — Encounter: Payer: Self-pay | Admitting: Family Medicine

## 2021-08-11 VITALS — BP 133/77 | HR 68 | Ht 65.0 in | Wt 190.0 lb

## 2021-08-11 DIAGNOSIS — I152 Hypertension secondary to endocrine disorders: Secondary | ICD-10-CM | POA: Diagnosis not present

## 2021-08-11 DIAGNOSIS — E785 Hyperlipidemia, unspecified: Secondary | ICD-10-CM | POA: Diagnosis not present

## 2021-08-11 DIAGNOSIS — E1159 Type 2 diabetes mellitus with other circulatory complications: Secondary | ICD-10-CM | POA: Diagnosis not present

## 2021-08-11 DIAGNOSIS — Z23 Encounter for immunization: Secondary | ICD-10-CM

## 2021-08-11 DIAGNOSIS — E1169 Type 2 diabetes mellitus with other specified complication: Secondary | ICD-10-CM | POA: Diagnosis not present

## 2021-08-11 LAB — CMP14+EGFR
ALT: 25 IU/L (ref 0–44)
AST: 21 IU/L (ref 0–40)
Albumin/Globulin Ratio: 1.8 (ref 1.2–2.2)
Albumin: 4.6 g/dL (ref 3.8–4.8)
Alkaline Phosphatase: 43 IU/L — ABNORMAL LOW (ref 44–121)
BUN/Creatinine Ratio: 25 — ABNORMAL HIGH (ref 10–24)
BUN: 23 mg/dL (ref 8–27)
Bilirubin Total: <0.2 mg/dL (ref 0.0–1.2)
CO2: 23 mmol/L (ref 20–29)
Calcium: 9.5 mg/dL (ref 8.6–10.2)
Chloride: 99 mmol/L (ref 96–106)
Creatinine, Ser: 0.91 mg/dL (ref 0.76–1.27)
Globulin, Total: 2.5 g/dL (ref 1.5–4.5)
Glucose: 151 mg/dL — ABNORMAL HIGH (ref 65–99)
Potassium: 5.2 mmol/L (ref 3.5–5.2)
Sodium: 136 mmol/L (ref 134–144)
Total Protein: 7.1 g/dL (ref 6.0–8.5)
eGFR: 92 mL/min/{1.73_m2} (ref >59)

## 2021-08-11 LAB — BAYER DCA HB A1C WAIVED: HB A1C (BAYER DCA - WAIVED): 7.5 % — ABNORMAL HIGH (ref <7.0)

## 2021-08-11 LAB — CBC WITH DIFFERENTIAL/PLATELET
Basophils Absolute: 0 10*3/uL (ref 0.0–0.2)
Basos: 1 %
EOS (ABSOLUTE): 0.3 10*3/uL (ref 0.0–0.4)
Eos: 3 %
Hematocrit: 45 % (ref 37.5–51.0)
Hemoglobin: 15.2 g/dL (ref 13.0–17.7)
Immature Grans (Abs): 0 10*3/uL (ref 0.0–0.1)
Immature Granulocytes: 0 %
Lymphocytes Absolute: 2.5 10*3/uL (ref 0.7–3.1)
Lymphs: 32 %
MCH: 29.7 pg (ref 26.6–33.0)
MCHC: 33.8 g/dL (ref 31.5–35.7)
MCV: 88 fL (ref 79–97)
Monocytes Absolute: 0.8 10*3/uL (ref 0.1–0.9)
Monocytes: 10 %
Neutrophils Absolute: 4.3 10*3/uL (ref 1.4–7.0)
Neutrophils: 54 %
Platelets: 278 10*3/uL (ref 150–450)
RBC: 5.11 x10E6/uL (ref 4.14–5.80)
RDW: 13.1 % (ref 11.6–15.4)
WBC: 7.9 10*3/uL (ref 3.4–10.8)

## 2021-08-11 LAB — LIPID PANEL
Chol/HDL Ratio: 4.1 ratio (ref 0.0–5.0)
Cholesterol, Total: 131 mg/dL (ref 100–199)
HDL: 32 mg/dL — ABNORMAL LOW (ref >39)
LDL Chol Calc (NIH): 59 mg/dL (ref 0–99)
Triglycerides: 250 mg/dL — ABNORMAL HIGH (ref 0–149)
VLDL Cholesterol Cal: 40 mg/dL (ref 5–40)

## 2021-08-11 MED ORDER — DAPAGLIFLOZIN PROPANEDIOL 10 MG PO TABS: 10.0000 mg | ORAL_TABLET | Freq: Every day | ORAL | 3 refills | Status: DC

## 2021-08-11 MED ORDER — MELOXICAM 15 MG PO TABS: 15.0000 mg | ORAL_TABLET | Freq: Every day | ORAL | 3 refills | Status: DC

## 2021-08-11 MED ORDER — SILDENAFIL CITRATE 20 MG PO TABS: 20.0000 mg | ORAL_TABLET | ORAL | 1 refills | Status: DC | PRN

## 2021-08-11 MED ORDER — SIMVASTATIN 40 MG PO TABS: 40.0000 mg | ORAL_TABLET | Freq: Every evening | ORAL | 3 refills | Status: DC

## 2021-08-11 NOTE — Progress Notes (Signed)
BP 133/77   Pulse 68   Ht _0  (1.651 m)   Wt 190 lb (86.2 kg)   SpO2 97%   BMI 31.62 kg/m    Subjective:   Patient ID: Austin Ortiz, male    DOB: 04-25-1951, 70 y.o.   MRN: [id number]  HPI: Austin Ortiz is a 70 y.o. male presenting on 08/11/2021 for Medical Management of Chronic Issues and Diabetes   HPI Type 2 diabetes mellitus Patient comes in today for recheck of his diabetes. Patient has been currently taking glimepiride and [place] and Janumet. Patient is currently on an ACE inhibitor/ARB. Patient has not seen an ophthalmologist this year. Patient denies any issues with their feet. The symptom started onset as an adult hypertension and hyperlipidemia ARE RELATED TO DM   Hypertension Patient is currently on lisinopril hydrochlorothiazide, and their blood pressure today is 133/77. Patient denies any lightheadedness or dizziness. Patient denies headaches, blurred vision, chest pains, shortness of breath, or weakness. Denies any side effects from medication and is content with current medication.   Hyperlipidemia Patient is coming in for recheck of his hyperlipidemia. The patient is currently taking simvastatin. They deny any issues with myalgias or history of liver damage from it. They deny any focal numbness or weakness or chest pain.   Relevant past medical, surgical, family and social history reviewed and updated as indicated. Interim medical history since our last visit reviewed. Allergies and medications reviewed and updated.  Review of Systems  Constitutional:  Negative for chills and fever.  Eyes:  Negative for visual disturbance.  Respiratory:  Negative for shortness of breath and wheezing.   Cardiovascular:  Negative for chest pain and leg swelling.  Musculoskeletal:  Negative for back pain and gait problem.  Skin:  Negative for rash.  Neurological:  Negative for dizziness, weakness and light-headedness.  All other systems reviewed and are negative.  Per  HPI unless specifically indicated above   Allergies as of 08/11/2021       Reactions   Cetuximab    Pt denies         Medication List        Accurate as of August 11, 2021 10:00 AM. If you have any questions, ask your nurse or doctor.          aspirin 81 MG tablet Take 81 mg by mouth daily.   dapagliflozin propanediol 10 MG Tabs tablet Commonly known as: Farxiga Take 1 tablet (10 mg total) by mouth daily.   Fish Oil 600 MG Caps Take 3,000 mg by mouth daily.   glimepiride 4 MG tablet Commonly known as: AMARYL Take 1 tablet (4 mg total) by mouth daily with breakfast.   glucosamine-chondroitin 500-400 MG tablet Take 1 tablet by mouth 3 (three) times daily.   HYDROcodone-acetaminophen 5-325 MG tablet Commonly known as: NORCO/VICODIN Take 1-2 tablets by mouth every 6 (six) hours as needed for moderate pain.   Janumet 50-1000 MG tablet Generic drug: sitaGLIPtin-metformin Take 1 tablet by mouth 2 (two) times daily with a meal.   lisinopril-hydrochlorothiazide 20-12.5 MG tablet Commonly known as: ZESTORETIC Take 1 tablet by mouth once daily   meloxicam 15 MG tablet Commonly known as: MOBIC Take 1 tablet (15 mg total) by mouth daily.   multivitamin tablet Take 1 tablet by mouth daily.   omeprazole 40 MG capsule Commonly known as: PRILOSEC Take 1 capsule (40 mg total) by mouth daily.   sildenafil 20 MG tablet Commonly known as: REVATIO Take 1-3  tablets (20-60 mg total) by mouth as needed.   simvastatin 40 MG tablet Commonly known as: ZOCOR Take 1 tablet (40 mg total) by mouth every evening.         Objective:   BP 133/77   Pulse 68   Ht _0  (1.651 m)   Wt 190 lb (86.2 kg)   SpO2 97%   BMI 31.62 kg/m   Wt Readings from Last 3 Encounters:  08/11/21 190 lb (86.2 kg)  05/12/21 187 lb 6.3 oz (85 kg)  05/10/21 195 lb (88.5 kg)    Physical Exam Vitals and nursing note reviewed.  Constitutional:      General: He is not in acute distress.     Appearance: He is well-developed. He is not diaphoretic.  Eyes:     General: No scleral icterus.    Conjunctiva/sclera: Conjunctivae normal.  Neck:     Thyroid: No thyromegaly.  Cardiovascular:     Rate and Rhythm: Normal rate and regular rhythm.     Heart sounds: Normal heart sounds. No murmur heard. Pulmonary:     Effort: Pulmonary effort is normal. No respiratory distress.     Breath sounds: Normal breath sounds. No wheezing.  Musculoskeletal:        General: No swelling. Normal range of motion.     Cervical back: Neck supple.  Lymphadenopathy:     Cervical: No cervical adenopathy.  Skin:    General: Skin is warm and dry.     Findings: No rash.  Neurological:     Mental Status: He is alert and oriented to person, place, and time.     Coordination: Coordination normal.  Psychiatric:        Behavior: Behavior normal.      Assessment & Plan:   Problem List Items Addressed This Visit       Cardiovascular and Mediastinum   Hypertension associated with diabetes ([place])   Relevant Medications   dapagliflozin propanediol (FARXIGA) 10 MG TABS tablet   sildenafil (REVATIO) 20 MG tablet   simvastatin (ZOCOR) 40 MG tablet   Other Relevant Orders   CBC with Differential/Platelet   CMP14+EGFR   Lipid panel   Bayer DCA Hb A1c Waived     Endocrine   T2DM (type 2 diabetes mellitus) (HCC) - Primary   Relevant Medications   dapagliflozin propanediol (FARXIGA) 10 MG TABS tablet   simvastatin (ZOCOR) 40 MG tablet   Other Relevant Orders   CBC with Differential/Platelet   CMP14+EGFR   Lipid panel   Bayer DCA Hb A1c Waived   Hyperlipidemia associated with type 2 diabetes mellitus (HCC)   Relevant Medications   dapagliflozin propanediol (FARXIGA) 10 MG TABS tablet   simvastatin (ZOCOR) 40 MG tablet   Other Relevant Orders   CBC with Differential/Platelet   CMP14+EGFR   Lipid panel   Bayer DCA Hb A1c Waived   Other Visit Diagnoses     Hyperlipidemia, unspecified  hyperlipidemia type       Relevant Medications   sildenafil (REVATIO) 20 MG tablet   simvastatin (ZOCOR) 40 MG tablet   Need for shingles vaccine       Relevant Orders   Varicella-zoster vaccine IM (Shingrix)     A1c is much improved 7.5, no change in medications.  We will continue to monitor for now.  Follow up plan: Return in about 3 months (around 11/11/2021), or if symptoms worsen or fail to improve, for Diabetes recheck.  Counseling provided for all of the vaccine components  Orders Placed This Encounter  Procedures   Varicella-zoster vaccine IM (Shingrix)   CBC with Differential/Platelet   CMP14+EGFR   Lipid panel   Bayer DCA Hb A1c [place] Alichia Alridge, MD [facility] 08/11/2021, 10:00 AM

## 2021-08-16 ENCOUNTER — Telehealth: Payer: Self-pay | Admitting: *Deleted

## 2021-08-16 NOTE — Telephone Encounter (Signed)
Denied today  Your request has been denied

## 2021-08-16 NOTE — Telephone Encounter (Signed)
Sildenafil Citrate '20MG'$  tablets  Key: [id number]  Sent to plan

## 2021-08-18 NOTE — Telephone Encounter (Signed)
Please let patient know that his insurance denied covering the medicine, he will likely just have to pay out-of-pocket

## 2021-08-18 NOTE — Telephone Encounter (Signed)
Left message for pt to return call.

## 2021-08-19 DIAGNOSIS — G4733 Obstructive sleep apnea (adult) (pediatric): Secondary | ICD-10-CM | POA: Diagnosis not present

## 2021-08-19 DIAGNOSIS — I10 Essential (primary) hypertension: Secondary | ICD-10-CM | POA: Diagnosis not present

## 2021-08-19 DIAGNOSIS — E119 Type 2 diabetes mellitus without complications: Secondary | ICD-10-CM | POA: Diagnosis not present

## 2021-08-19 DIAGNOSIS — Z7984 Long term (current) use of oral hypoglycemic drugs: Secondary | ICD-10-CM | POA: Diagnosis not present

## 2021-08-19 DIAGNOSIS — Z4542 Encounter for adjustment and management of neuropacemaker (brain) (peripheral nerve) (spinal cord): Secondary | ICD-10-CM | POA: Diagnosis not present

## 2021-08-20 ENCOUNTER — Encounter: Payer: Self-pay | Admitting: Family Medicine

## 2021-08-20 NOTE — Telephone Encounter (Signed)
Left detailed message per patients DPR. Advised patient to check out GoodRx for discount pricing options

## 2021-08-31 ENCOUNTER — Encounter (HOSPITAL_BASED_OUTPATIENT_CLINIC_OR_DEPARTMENT_OTHER): Payer: Medicare HMO | Admitting: Sleep Medicine

## 2021-09-16 ENCOUNTER — Ambulatory Visit (HOSPITAL_BASED_OUTPATIENT_CLINIC_OR_DEPARTMENT_OTHER): Payer: Medicare HMO | Attending: Sleep Medicine | Admitting: Sleep Medicine

## 2021-09-16 ENCOUNTER — Other Ambulatory Visit: Payer: Self-pay

## 2021-09-16 DIAGNOSIS — G4733 Obstructive sleep apnea (adult) (pediatric): Secondary | ICD-10-CM | POA: Insufficient documentation

## 2021-09-28 DIAGNOSIS — Z7984 Long term (current) use of oral hypoglycemic drugs: Secondary | ICD-10-CM | POA: Diagnosis not present

## 2021-09-28 DIAGNOSIS — E119 Type 2 diabetes mellitus without complications: Secondary | ICD-10-CM | POA: Diagnosis not present

## 2021-09-28 DIAGNOSIS — I10 Essential (primary) hypertension: Secondary | ICD-10-CM | POA: Diagnosis not present

## 2021-09-28 DIAGNOSIS — G4733 Obstructive sleep apnea (adult) (pediatric): Secondary | ICD-10-CM | POA: Diagnosis not present

## 2021-09-28 DIAGNOSIS — Z4542 Encounter for adjustment and management of neuropacemaker (brain) (peripheral nerve) (spinal cord): Secondary | ICD-10-CM | POA: Diagnosis not present

## 2021-10-18 ENCOUNTER — Other Ambulatory Visit: Payer: Self-pay | Admitting: Family Medicine

## 2021-10-18 DIAGNOSIS — E1169 Type 2 diabetes mellitus with other specified complication: Secondary | ICD-10-CM

## 2021-10-18 NOTE — Procedures (Signed)
   NAME: Austin Ortiz DATE OF BIRTH:  Sep 26, 1951 MEDICAL RECORD NUMBER [id number]  LOCATION: [facility]  PHYSICIAN: Aldahir Litaker D Kingslee Mairena  DATE OF STUDY: 09/16/2021  SLEEP STUDY TYPE: Inspire Titration Study               REFERRING PHYSICIAN: Elmarie Mainland, MD  CLINICAL INFORMATION Demarko Zeimet is a 70 year old Male and was referred to the sleep center for evaluation of Inspire therapy clinical efficacy.  MEDICATIONS Patient self administered medications include: Simvastatin, Omeprazole, Janumet. No sleep medicine was administered.  SLEEP STUDY TECHNIQUE The patient underwent an attended overnight polysomnography titration to assess the effects of Inspire therapy. The following variables were monitored: EEG(C4-A1, C3-A2, O1-A2, O2-A1), EOG, submental and leg EMG, ECG, oxyhemoglobin saturation by pulse oximetry, thoracic and abdominal respiratory effort belts, nasal/oral airflow by pressure sensor, body position sensor and snoring sensor.   SLEEP ARCHITECTURE The study was initiated at 10:32:12 PM and terminated at 5:02:23 AM. Total recorded time was 390.2 minutes. EEG confirmed total sleep time was 327 minutes yielding a sleep efficiency of 83.8%%. Sleep onset after lights out was 4.2 minutes with a REM latency of 124.5 minutes. The patient spent 13.1%% of the night in stage N1 sleep, 73.7%% in stage N2 sleep, 0.0%% in stage N3 and 13.2% in REM. The Arousal Index was 23.1/hour. RESPIRATORY PARAMETERS The overall AHI was 10.5 per hour, and the RDI was 23.1 events/hour with a central apnea index of 0.6 per hour. The most appropriate Inspire setting was an amplitude of 1.3 V. At this setting, the sleep efficiency was 92% and the patient was supine for 25%. The AHI was 7.1 events per hour, and the RDI was 12.6 events/hour (with 0.6 central events) and the arousal index was 5.6 per hour.The oxygen nadir was 90.0% during sleep.  LEG MOVEMENT DATA The total leg movements  were 0 with a resulting leg movement index of 0.0/hr. Associated arousal with leg movement index was 0.0/hr. CARDIAC DATA The underlying cardiac rhythm was most consistent with sinus rhythm. Mean heart rate during sleep was 59.9 bpm. Additional rhythm abnormalities include PVCs. IMPRESSIONS - Obstructive Sleep apnea (OSA) with therapeutic amplitude setting attained. DIAGNOSIS - Obstructive Sleep Apnea (G47.33) RECOMMENDATIONS - Return to sleep medicine office for therapeutic amplitude setting change to 1.3 V on Inspire therapy. - Avoid alcohol, sedatives and other CNS depressants that may worsen sleep apnea and disrupt normal sleep architecture. - Sleep hygiene should be reviewed to assess factors that may improve sleep quality. - Weight management and regular exercise should be initiated or continued.  Taisa Deloria D Sanya Kobrin Diplomate, American Board of Internal Medicine  ELECTRONICALLY SIGNED ON:  10/18/2021, 2:05 PM [place] PH: [phone]   FX: [phone] [place]

## 2021-11-12 ENCOUNTER — Other Ambulatory Visit: Payer: Self-pay

## 2021-11-12 ENCOUNTER — Ambulatory Visit (INDEPENDENT_AMBULATORY_CARE_PROVIDER_SITE_OTHER): Payer: Medicare HMO | Admitting: Family Medicine

## 2021-11-12 ENCOUNTER — Encounter: Payer: Self-pay | Admitting: Family Medicine

## 2021-11-12 VITALS — BP 116/71 | HR 71 | Ht 66.0 in | Wt 192.0 lb

## 2021-11-12 DIAGNOSIS — E1169 Type 2 diabetes mellitus with other specified complication: Secondary | ICD-10-CM

## 2021-11-12 DIAGNOSIS — E785 Hyperlipidemia, unspecified: Secondary | ICD-10-CM | POA: Diagnosis not present

## 2021-11-12 DIAGNOSIS — E1159 Type 2 diabetes mellitus with other circulatory complications: Secondary | ICD-10-CM

## 2021-11-12 DIAGNOSIS — I152 Hypertension secondary to endocrine disorders: Secondary | ICD-10-CM

## 2021-11-12 LAB — BAYER DCA HB A1C WAIVED: HB A1C (BAYER DCA - WAIVED): 7.4 % — ABNORMAL HIGH (ref 4.8–5.6)

## 2021-11-12 MED ORDER — LISINOPRIL-HYDROCHLOROTHIAZIDE 20-12.5 MG PO TABS: 1.0000 | ORAL_TABLET | Freq: Every day | ORAL | 3 refills | Status: DC

## 2021-11-12 NOTE — Progress Notes (Signed)
BP 116/71   Pulse 71   Ht 5\' 6"  (1.676 m)   Wt 192 lb (87.1 kg)   SpO2 99%   BMI 30.99 kg/m    Subjective:   Patient ID: Austin Ortiz, male    DOB: [birth year], 70 y.o.   MRN: [id number]  HPI: Austin Ortiz is a 70 y.o. male presenting on 11/12/2021 for Medical Management of Chronic Issues, Diabetes, and Hypertension   HPI Type 2 diabetes mellitus Patient comes in today for recheck of his diabetes. Patient has been currently taking glimepiride and Janumet. Patient is currently on an ACE inhibitor/ARB. Patient has not seen an ophthalmologist this year. Patient denies any issues with their feet. The symptom started onset as an adult hypertension and hyperlipidemia ARE RELATED TO DM   Hypertension Patient is currently on lisinopril hydrochlorothiazide, and their blood pressure today is 116/71. Patient denies any lightheadedness or dizziness. Patient denies headaches, blurred vision, chest pains, shortness of breath, or weakness. Denies any side effects from medication and is content with current medication.   Hyperlipidemia Patient is coming in for recheck of his hyperlipidemia. The patient is currently taking simvastatin. They deny any issues with myalgias or history of liver damage from it. They deny any focal numbness or weakness or chest pain.   Relevant past medical, surgical, family and social history reviewed and updated as indicated. Interim medical history since our last visit reviewed. Allergies and medications reviewed and updated.  Review of Systems  Constitutional:  Negative for chills and fever.  Eyes:  Negative for visual disturbance.  Respiratory:  Negative for shortness of breath and wheezing.   Cardiovascular:  Negative for chest pain and leg swelling.  Musculoskeletal:  Negative for back pain and gait problem.  Skin:  Negative for rash.  Neurological:  Negative for dizziness, weakness and numbness.  All other systems reviewed and are negative.  Per HPI  unless specifically indicated above   Allergies as of 11/12/2021       Reactions   Cetuximab    Pt denies         Medication List        Accurate as of November 12, 2021 11:12 AM. If you have any questions, ask your nurse or doctor.          aspirin 81 MG tablet Take 81 mg by mouth daily.   dapagliflozin propanediol 10 MG Tabs tablet Commonly known as: Farxiga Take 1 tablet (10 mg total) by mouth daily.   Fish Oil 600 MG Caps Take 3,000 mg by mouth daily.   glimepiride 4 MG tablet Commonly known as: AMARYL Take 1 tablet (4 mg total) by mouth daily with breakfast.   glucosamine-chondroitin 500-400 MG tablet Take 1 tablet by mouth 3 (three) times daily.   HYDROcodone-acetaminophen 5-325 MG tablet Commonly known as: NORCO/VICODIN Take 1-2 tablets by mouth every 6 (six) hours as needed for moderate pain.   Janumet 50-1000 MG tablet Generic drug: sitaGLIPtin-metformin Take 1 tablet by mouth 2 (two) times daily with a meal.   lisinopril-hydrochlorothiazide 20-12.5 MG tablet Commonly known as: ZESTORETIC Take 1 tablet by mouth daily.   meloxicam 15 MG tablet Commonly known as: MOBIC Take 1 tablet (15 mg total) by mouth daily.   multivitamin tablet Take 1 tablet by mouth daily.   omeprazole 40 MG capsule Commonly known as: PRILOSEC Take 1 capsule (40 mg total) by mouth daily.   sildenafil 20 MG tablet Commonly known as: REVATIO Take 1-3 tablets (20-60  mg total) by mouth as needed.   simvastatin 40 MG tablet Commonly known as: ZOCOR Take 1 tablet (40 mg total) by mouth every evening.         Objective:   BP 116/71   Pulse 71   Ht 5\' 6"  (1.676 m)   Wt 192 lb (87.1 kg)   SpO2 99%   BMI 30.99 kg/m   Wt Readings from Last 3 Encounters:  11/12/21 192 lb (87.1 kg)  09/16/21 192 lb (87.1 kg)  08/11/21 190 lb (86.2 kg)    Physical Exam Vitals and nursing note reviewed.  Constitutional:      General: He is not in acute distress.     Appearance: He is well-developed. He is not diaphoretic.  Eyes:     General: No scleral icterus.    Conjunctiva/sclera: Conjunctivae normal.  Neck:     Thyroid: No thyromegaly.  Cardiovascular:     Rate and Rhythm: Normal rate and regular rhythm.     Heart sounds: Normal heart sounds. No murmur heard. Pulmonary:     Effort: Pulmonary effort is normal. No respiratory distress.     Breath sounds: Normal breath sounds. No wheezing.  Musculoskeletal:        General: No swelling.     Cervical back: Neck supple.  Lymphadenopathy:     Cervical: No cervical adenopathy.  Skin:    General: Skin is warm and dry.     Findings: No rash.  Neurological:     Mental Status: He is alert and oriented to person, place, and time.     Coordination: Coordination normal.  Psychiatric:        Behavior: Behavior normal.      Assessment & Plan:   Problem List Items Addressed This Visit       Cardiovascular and Mediastinum   Hypertension associated with diabetes ([place])   Relevant Medications   lisinopril-hydrochlorothiazide (ZESTORETIC) 20-12.5 MG tablet     Endocrine   T2DM (type 2 diabetes mellitus) ([place]) - Primary   Relevant Medications   lisinopril-hydrochlorothiazide (ZESTORETIC) 20-12.5 MG tablet   Other Relevant Orders   Bayer DCA Hb A1c Waived   AMB Referral to [place]   AMB Referral to [facility]   Hyperlipidemia associated with type 2 diabetes mellitus ([place])   Relevant Medications   lisinopril-hydrochlorothiazide (ZESTORETIC) 20-12.5 MG tablet    A1c 7.4 today, was 7.5 last time, still slightly higher than we would like.  Discussed possible medication changes versus dietary changes, will send to [facility] referral for diabetes education, patient was a little wary of doing a shot at this point but will try it with diet and see if we can improve. Follow up plan: Return in about 3 months (around 02/12/2022), or if symptoms worsen or fail to improve, for  Diabetes and hypertension and cholesterol.  Counseling provided for all of the vaccine components Orders Placed This Encounter  Procedures   Bayer [place] Hb A1c Waived   AMB Referral to [facility]    Caryl Pina, MD [facility] 11/12/2021, 11:12 AM

## 2021-11-15 ENCOUNTER — Telehealth: Payer: Self-pay

## 2021-11-15 NOTE — Chronic Care Management (AMB) (Signed)
  Chronic Care Management   Note  11/15/2021 Name: TYRRELL STEPHENS MRN: [id number] DOB: August 23, 1951  ISAHIA HOLLERBACH is a 70 y.o. year old male who is a primary care patient of Dettinger, Fransisca Kaufmann, MD. I reached out to Lauralyn Primes by phone today in response to a referral sent by Mr. ASHWIN TIBBS PCP.  Mr. [place] was given information about Chronic Care Management services today including:  CCM service includes personalized support from designated clinical staff supervised by his physician, including individualized plan of care and coordination with other care providers 24/7 contact phone numbers for assistance for urgent and routine care needs. Service will only be billed when office clinical staff spend 20 minutes or more in a month to coordinate care. Only one practitioner may furnish and bill the service in a calendar month. The patient may stop CCM services at any time (effective at the end of the month) by phone call to the office staff. The patient is responsible for co-pay (up to 20% after annual deductible is met) if co-pay is required by the individual health plan.   Patient agreed to services and verbal consent obtained.   Follow up plan: Telephone appointment with care management team member scheduled for:12/23/2021  Noreene Larsson, [address], [place], [place] [address] Direct Dial: [phone] [email or web address]

## 2021-12-23 ENCOUNTER — Ambulatory Visit (INDEPENDENT_AMBULATORY_CARE_PROVIDER_SITE_OTHER): Payer: Medicare HMO | Admitting: Pharmacist

## 2021-12-23 DIAGNOSIS — E1169 Type 2 diabetes mellitus with other specified complication: Secondary | ICD-10-CM

## 2021-12-23 NOTE — Patient Instructions (Addendum)
Visit Information  Thank you for taking time to visit with me today. Please don't hesitate to contact me if I can be of assistance to you before our next scheduled telephone appointment.  Following are the goals we discussed today:  Current Barriers:  Unable to independently afford treatment regimen Unable to maintain control of T2DM Suboptimal therapeutic regimen for T2DM  Pharmacist Clinical Goal(s):  patient will verbalize ability to afford treatment regimen achieve improvement in T2DM as evidenced by T2DM through collaboration with PharmD and provider.    Interventions: 1:1 collaboration with Dettinger, Fransisca Kaufmann, MD regarding development and update of comprehensive plan of care as evidenced by provider attestation and co-signature Inter-disciplinary care team collaboration (see longitudinal plan of care) Comprehensive medication review performed; medication list updated in electronic medical record  Diabetes: New goal. Uncontrolled-A1C 7.4%; current treatment: FARXIGA, JANUMET, GLIMEPIRIDE (d/c);  Current glucose readings: fasting glucose: as low as 90, post prandial glucose: <200 Denies hypoglycemic/hyperglycemic symptoms Current meal patterns:  Discussed meal planning options and Plate method for healthy eating Avoid sugary drinks and desserts Incorporate balanced protein, non starchy veggies, 1 serving of carbohydrate with each meal Increase water intake Increase physical activity as able Patient is following the "eat to live"  Current exercise: n/a Recommended blood sugar now at goal with lifestyle/diet changes; will continue current regimen but d/c glimepiride; consider combining janumet and farxiga into trijardy or d/c janumet and convert to GLP1/metformin combo  Assessed patient finances. Apply to [employer] patient assistance  Lipid Panel--LDL at goal, continue current regimen     Component Value Date/Time   CHOL 131 08/11/2021 0920   TRIG 250 (H) 08/11/2021 0920   TRIG  140 09/15/2014 1529   HDL 32 (L) 08/11/2021 0920   HDL 39 (L) 09/15/2014 1529   CHOLHDL 4.1 08/11/2021 0920   LDLCALC 59 08/11/2021 0920   LDLCALC 63 09/15/2014 1529   LABVLDL 40 08/11/2021 0920     Patient Goals/Self-Care Activities patient will:  - take medications as prescribed as evidenced by patient report and record review check glucose daily, document, and provide at future appointments collaborate with provider on medication access solutions engage in dietary modifications by eat to live method    Please call the care guide team at [phone] if you need to cancel or reschedule your appointment.   The patient verbalized understanding of instructions, educational materials, and care plan provided today and declined offer to receive copy of patient instructions, educational materials, and care plan.   Signature Regina Eck, PharmD, BCPS Clinical Pharmacist, [place]  II Phone [phone]

## 2021-12-23 NOTE — Progress Notes (Signed)
Chronic Care Management Pharmacy Note  12/23/2021 Name:  Austin Ortiz MRN:  [id number] DOB:  12/30/1950  Summary: T2DM ,HLD  Recommendations/Changes made from today's visit: Diabetes: New goal. Uncontrolled-A1C 7.4%; current treatment: FARXIGA, JANUMET, GLIMEPIRIDE (d/c);  Current glucose readings: fasting glucose: as low as 90, post prandial glucose: <200 Denies hypoglycemic/hyperglycemic symptoms Current meal patterns:  Discussed meal planning options and Plate method for healthy eating Avoid sugary drinks and desserts Incorporate balanced protein, non starchy veggies, 1 serving of carbohydrate with each meal Increase water intake Increase physical activity as able Patient is following the "eat to live"  Current exercise: n/a Recommended blood sugar now at goal with lifestyle/diet changes; will continue current regimen but d/c glimepiride; consider combining janumet and farxiga into trijardy or d/c janumet and convert to GLP1/metformin combo  Assessed patient finances. Apply to az&me patient assistance  Lipid Panel--LDL at goal, continue current regimen     Component Value Date/Time   CHOL 131 08/11/2021 0920   TRIG 250 (H) 08/11/2021 0920   TRIG 140 09/15/2014 1529   HDL 32 (L) 08/11/2021 0920   HDL 39 (L) 09/15/2014 1529   CHOLHDL 4.1 08/11/2021 0920   LDLCALC 59 08/11/2021 0920   LDLCALC 63 09/15/2014 1529   LABVLDL 40 08/11/2021 0920    Patient Goals/Self-Care Activities patient will:  - take medications as prescribed as evidenced by patient report and record review check glucose daily, document, and provide at future appointments collaborate with provider on medication access solutions engage in dietary modifications by eat to live method  Plan: F/U WITH PCP IN FEB  Subjective: Austin Ortiz is an 70 y.o. year old male who is a primary patient of Dettinger, Fransisca Kaufmann, MD.  The CCM team was consulted for assistance with disease management and care  coordination needs.    Engaged with patient by telephone for initial visit in response to provider referral for pharmacy case management and/or care coordination services.   Consent to Services:  The patient was given information about Chronic Care Management services, agreed to services, and gave verbal consent prior to initiation of services.  Please see initial visit note for detailed documentation.   Patient Care Team: Dettinger, Fransisca Kaufmann, MD as PCP - General (Family Medicine) Lavera Guise, [facility] as [place] Management (Pharmacist)   Objective:  Lab Results  Component Value Date   CREATININE 0.91 08/11/2021   CREATININE 0.81 05/10/2021   CREATININE 0.85 03/29/2021    Lab Results  Component Value Date   HGBA1C 7.4 (H) 11/12/2021   Last diabetic Eye exam:  Lab Results  Component Value Date/Time   HMDIABEYEEXA No Retinopathy 07/08/2020 12:00 AM    Last diabetic Foot exam: No results found for: HMDIABFOOTEX      Component Value Date/Time   CHOL 131 08/11/2021 0920   TRIG 250 (H) 08/11/2021 0920   TRIG 140 09/15/2014 1529   HDL 32 (L) 08/11/2021 0920   HDL 39 (L) 09/15/2014 1529   CHOLHDL 4.1 08/11/2021 0920   LDLCALC 59 08/11/2021 0920   LDLCALC 63 09/15/2014 1529    Hepatic Function Latest Ref Rng & Units 08/11/2021 02/08/2021 11/05/2020  Total Protein 6.0 - 8.5 g/dL 7.1 7.2 7.3  Albumin 3.8 - 4.8 g/dL 4.6 4.5 4.7  AST 0 - 40 IU/L '21 24 25  ' ALT 0 - 44 IU/L 25 34 33  Alk Phosphatase 44 - 121 IU/L 43(L) 44 45  Total Bilirubin 0.0 - 1.2 mg/dL <0.2  0.3 0.4    Lab Results  Component Value Date/Time   TSH 2.950 08/03/2020 08:17 AM   TSH 4.860 (H) 04/30/2020 08:01 AM    CBC Latest Ref Rng & Units 08/11/2021 02/08/2021 11/05/2020  WBC 3.4 - 10.8 x10E3/uL 7.9 7.9 7.2  Hemoglobin 13.0 - 17.7 g/dL 15.2 15.4 15.5  Hematocrit 37.5 - 51.0 % 45.0 47.9 47.8  Platelets 150 - 450 x10E3/uL 278 277 289    Lab Results  Component Value Date/Time    VD25OH 34.5 04/02/2015 11:47 AM    Clinical ASCVD: No  The 10-year ASCVD risk score (Arnett DK, et al., 2019) is: 29.2%   Values used to calculate the score:     Age: 82 years     Sex: Male     Is Non-Hispanic African American: No     Diabetic: Yes     Tobacco smoker: No     Systolic Blood Pressure: 578 mmHg     Is BP treated: Yes     HDL Cholesterol: 32 mg/dL     Total Cholesterol: 131 mg/dL    Other: (CHADS2VASc if Afib, PHQ9 if depression, MMRC or CAT for COPD, ACT, DEXA)  Social History   Tobacco Use  Smoking Status Never  Smokeless Tobacco Never   BP Readings from Last 3 Encounters:  11/12/21 116/71  08/11/21 133/77  05/12/21 (!) 112/54   Pulse Readings from Last 3 Encounters:  11/12/21 71  08/11/21 68  05/12/21 79   Wt Readings from Last 3 Encounters:  11/12/21 192 lb (87.1 kg)  09/16/21 192 lb (87.1 kg)  08/11/21 190 lb (86.2 kg)    Assessment: Review of patient past medical history, allergies, medications, health status, including review of consultants reports, laboratory and other test data, was performed as part of comprehensive evaluation and provision of chronic care management services.   SDOH:  (Social Determinants of Health) assessments and interventions performed:    CCM Care Plan  Allergies  Allergen Reactions   Cetuximab     Pt denies      Medications Reviewed Today     Reviewed by Lavera Guise, [facility] (Pharmacist) on 12/23/21 at [phone]  Med List Status: <None>   Medication Order Taking? Sig Documenting Provider Last Dose Status Informant  aspirin 81 MG tablet [id number] No Take 81 mg by mouth daily. [provider] Taking Active   dapagliflozin propanediol (FARXIGA) 10 MG TABS tablet [id number] No Take 1 tablet (10 mg total) by mouth daily. Dettinger, Fransisca Kaufmann, MD Taking Active   glimepiride (AMARYL) 4 MG tablet [id number] No Take 1 tablet (4 mg total) by mouth daily with breakfast. Dettinger, Fransisca Kaufmann, MD Taking Active    glucosamine-chondroitin 500-400 MG tablet [id number] No Take 1 tablet by mouth 3 (three) times daily. [provider] Taking Active   HYDROcodone-acetaminophen (NORCO/VICODIN) 5-325 MG tablet [id number] No Take 1-2 tablets by mouth every 6 (six) hours as needed for moderate pain. Melida Quitter, MD Taking Active   lisinopril-hydrochlorothiazide (ZESTORETIC) 20-12.5 MG tablet [id number]  Take 1 tablet by mouth daily. Dettinger, Fransisca Kaufmann, MD  Active   meloxicam (MOBIC) 15 MG tablet [id number] No Take 1 tablet (15 mg total) by mouth daily. Dettinger, Fransisca Kaufmann, MD Taking Active   Multiple Vitamin (MULTIVITAMIN) tablet [id number] No Take 1 tablet by mouth daily. [provider] Taking Active   Omega-3 Fatty Acids (FISH OIL) 600 MG CAPS [id number] No Take 3,000 mg by mouth daily.  [provider] Taking Active  omeprazole (PRILOSEC) 40 MG capsule [id number] No Take 1 capsule (40 mg total) by mouth daily. Dettinger, Fransisca Kaufmann, MD Taking Active   sildenafil (REVATIO) 20 MG tablet [id number] No Take 1-3 tablets (20-60 mg total) by mouth as needed. Dettinger, Fransisca Kaufmann, MD Taking Active   simvastatin (ZOCOR) 40 MG tablet [id number] No Take 1 tablet (40 mg total) by mouth every evening. Dettinger, Fransisca Kaufmann, MD Taking Active   sitaGLIPtin-metformin (JANUMET) 50-1000 MG tablet [id number] No Take 1 tablet by mouth 2 (two) times daily with a meal. Dettinger, Fransisca Kaufmann, MD Taking Active             Patient Active Problem List   Diagnosis Date Noted   GERD (gastroesophageal reflux disease) 04/02/2015   Hyperlipidemia associated with type 2 diabetes mellitus ([place])    T2DM (type 2 diabetes mellitus) ([place]) 06/25/2013   Hypertension associated with diabetes ([place]) 06/25/2013   Obstructive sleep apnea 05/28/2013   Other dysphagia 04/18/2013    Immunization History  Administered Date(s) Administered   Fluad Quad(high Dose 65+) 10/21/2019   Influenza, High Dose Seasonal PF 11/27/2017    Influenza,inj,Quad PF,6+ Mos 09/25/2013, 10/01/2016   Influenza-Unspecified 09/26/2015, 11/30/2018, 10/06/2020, 09/27/2021   Moderna Sars-Covid-2 Vaccination 01/16/2020, 02/21/2020, 10/31/2020   Pneumococcal Conjugate-13 12/13/2013   Pneumococcal Polysaccharide-23 07/03/2018   Tdap 03/29/2012   Zoster Recombinat (Shingrix) 08/11/2021   Zoster, Live 07/15/2015    Conditions to be addressed/monitored: HLD and DMII  Care Plan : PHARMD MEDICATION MANAGEMENT  Updates made by Lavera Guise, [place] since 12/23/2021 12:00 AM     Problem: DISEASE PROGRESSION PREVENTION      Long-Range Goal: T2DM   This Visit's Progress: Not on track  Priority: High  Note:   Current Barriers:  Unable to independently afford treatment regimen Unable to maintain control of T2DM Suboptimal therapeutic regimen for T2DM  Pharmacist Clinical Goal(s):  patient will verbalize ability to afford treatment regimen achieve improvement in T2DM as evidenced by T2DM  through collaboration with PharmD and provider.    Interventions: 1:1 collaboration with Dettinger, Fransisca Kaufmann, MD regarding development and update of comprehensive plan of care as evidenced by provider attestation and co-signature Inter-disciplinary care team collaboration (see longitudinal plan of care) Comprehensive medication review performed; medication list updated in electronic medical record  Diabetes: New goal. Uncontrolled-A1C 7.4%; current treatment: FARXIGA, JANUMET, GLIMEPIRIDE (d/c);  Current glucose readings: fasting glucose: as low as 90, post prandial glucose: <200 Denies hypoglycemic/hyperglycemic symptoms Current meal patterns:  Discussed meal planning options and Plate method for healthy eating Avoid sugary drinks and desserts Incorporate balanced protein, non starchy veggies, 1 serving of carbohydrate with each meal Increase water intake Increase physical activity as able Patient is following the "eat to live"  Current  exercise: n/a Recommended blood sugar now at goal with lifestyle/diet changes; will continue current regimen but d/c glimepiride; consider combining janumet and farxiga into trijardy or d/c janumet and convert to GLP1/metformin combo  Assessed patient finances. Apply to az&me patient assistance  Lipid Panel--LDL at goal, continue current regimen     Component Value Date/Time   CHOL 131 08/11/2021 0920   TRIG 250 (H) 08/11/2021 0920   TRIG 140 09/15/2014 1529   HDL 32 (L) 08/11/2021 0920   HDL 39 (L) 09/15/2014 1529   CHOLHDL 4.1 08/11/2021 0920   LDLCALC 59 08/11/2021 0920   LDLCALC 63 09/15/2014 1529   LABVLDL 40 08/11/2021 0920    Patient Goals/Self-Care Activities patient will:  - take medications as prescribed  as evidenced by patient report and record review check glucose daily, document, and provide at future appointments collaborate with provider on medication access solutions engage in dietary modifications by eat to live method      Medication Assistance: Application for [place]  medication assistance program. in process.  Anticipated assistance start date TBD.  See plan of care for additional detail.  Patient's preferred pharmacy is:  [facility] [address] [place] [address] [place] [address] Phone: [phone] Fax: [phone]  Uses pill box? No - N/A Pt endorses 100% compliance  Follow Up:  Patient requests no follow-up at this time.  Plan: Next PCP appointment scheduled for:  01/2022  Regina Eck, PharmD, BCPS Clinical Pharmacist, [place]  [facility] Phone [phone]

## 2021-12-25 DIAGNOSIS — E1169 Type 2 diabetes mellitus with other specified complication: Secondary | ICD-10-CM

## 2021-12-25 DIAGNOSIS — E785 Hyperlipidemia, unspecified: Secondary | ICD-10-CM | POA: Diagnosis not present

## 2022-01-21 ENCOUNTER — Other Ambulatory Visit: Payer: Self-pay | Admitting: Family Medicine

## 2022-01-27 ENCOUNTER — Ambulatory Visit (INDEPENDENT_AMBULATORY_CARE_PROVIDER_SITE_OTHER): Payer: Medicare HMO | Admitting: Nurse Practitioner

## 2022-01-27 ENCOUNTER — Encounter: Payer: Self-pay | Admitting: Nurse Practitioner

## 2022-01-27 DIAGNOSIS — U071 COVID-19: Secondary | ICD-10-CM

## 2022-01-27 MED ORDER — MOLNUPIRAVIR EUA 200MG CAPSULE: 4.0000 | ORAL_CAPSULE | Freq: Two times a day (BID) | ORAL | 0 refills | Status: AC

## 2022-01-27 NOTE — Progress Notes (Signed)
Virtual Visit  Note Due to COVID-19 pandemic this visit was conducted virtually. This visit type was conducted due to national recommendations for restrictions regarding the COVID-19 Pandemic (e.g. social distancing, sheltering in place) in an effort to limit this patient's exposure and mitigate transmission in our community. All issues noted in this document were discussed and addressed.  A physical exam was not performed with this format.  I connected with Austin Ortiz on 01/27/22 at 2:20 by telephone and verified that I am speaking with the correct person using two identifiers. Austin Ortiz is currently located at home and his wife is currently with him during visit. The provider, Mary-Margaret Hassell Done, FNP is located in their office at time of visit.  I discussed the limitations, risks, security and privacy concerns of performing an evaluation and management service by telephone and the availability of in person appointments. I also discussed with the patient that there may be a patient responsible charge related to this service. The patient expressed understanding and agreed to proceed.   History and Present Illness:  URI  This is a new problem. The current episode started 1 to 4 weeks ago. The problem has been gradually improving. The maximum temperature recorded prior to his arrival was 100.4 - 100.9 F. The fever has been present for 1 to 2 days. Associated symptoms include congestion, coughing, headaches, rhinorrhea and a sore throat. He has tried decongestant and acetaminophen for the symptoms. The treatment provided mild relief.  Tested positive for covid today.    Review of Systems  HENT:  Positive for congestion, rhinorrhea and sore throat.   Respiratory:  Positive for cough.   Neurological:  Positive for headaches.    Observations/Objective: Alert and oriented- answers all questions appropriately No distress Raspy voice Dry cough  Assessment and Plan: [name]  in today with chief complaint of No chief complaint on file.   1. Lab test positive for detection of COVID-19 virus 1. Take meds as prescribed 2. Use a cool mist humidifier especially during the winter months and when heat has been humid. 3. Use saline nose sprays frequently 4. Saline irrigations of the nose can be very helpful if done frequently.  * 4X daily for 1 week*  * Use of a nettie pot can be helpful with this. Follow directions with this* 5. Drink plenty of fluids 6. Keep thermostat turn down low 7.For any cough or congestion- delsym or mucinex 8. For fever or aces or pains- take tylenol or ibuprofen appropriate for age and weight.  * for fevers greater than 101 orally you may alternate ibuprofen and tylenol every  3 hours.    - molnupiravir EUA (LAGEVRIO) 200 mg CAPS capsule; Take 4 capsules (800 mg total) by mouth 2 (two) times daily for 5 days.  Dispense: 40 capsule; Refill: 0    Follow Up Instructions: prn    I discussed the assessment and treatment plan with the patient. The patient was provided an opportunity to ask questions and all were answered. The patient agreed with the plan and demonstrated an understanding of the instructions.   The patient was advised to call back or seek an in-person evaluation if the symptoms worsen or if the condition fails to improve as anticipated.  The above assessment and management plan was discussed with the patient. The patient verbalized understanding of and has agreed to the management plan. Patient is aware to call the clinic if symptoms persist or worsen. Patient is aware when  to return to the clinic for a follow-up visit. Patient educated on when it is appropriate to go to the emergency department.   Time call ended:  2:33  I provided 12 minutes of  non face-to-face time during this encounter.    Mary-Margaret Hassell Done, FNP

## 2022-02-17 ENCOUNTER — Encounter: Payer: Self-pay | Admitting: Family Medicine

## 2022-02-17 ENCOUNTER — Ambulatory Visit (INDEPENDENT_AMBULATORY_CARE_PROVIDER_SITE_OTHER): Payer: Medicare HMO | Admitting: Family Medicine

## 2022-02-17 VITALS — BP 120/69 | HR 69 | Ht 66.0 in | Wt 185.0 lb

## 2022-02-17 DIAGNOSIS — E785 Hyperlipidemia, unspecified: Secondary | ICD-10-CM | POA: Diagnosis not present

## 2022-02-17 DIAGNOSIS — E1159 Type 2 diabetes mellitus with other circulatory complications: Secondary | ICD-10-CM

## 2022-02-17 DIAGNOSIS — E1169 Type 2 diabetes mellitus with other specified complication: Secondary | ICD-10-CM | POA: Diagnosis not present

## 2022-02-17 DIAGNOSIS — I152 Hypertension secondary to endocrine disorders: Secondary | ICD-10-CM | POA: Diagnosis not present

## 2022-02-17 DIAGNOSIS — K2100 Gastro-esophageal reflux disease with esophagitis, without bleeding: Secondary | ICD-10-CM

## 2022-02-17 DIAGNOSIS — Z23 Encounter for immunization: Secondary | ICD-10-CM | POA: Diagnosis not present

## 2022-02-17 LAB — LIPID PANEL
Chol/HDL Ratio: 3.5 ratio (ref 0.0–5.0)
Cholesterol, Total: 109 mg/dL (ref 100–199)
HDL: 31 mg/dL — ABNORMAL LOW (ref >39)
LDL Chol Calc (NIH): 48 mg/dL (ref 0–99)
Triglycerides: 184 mg/dL — ABNORMAL HIGH (ref 0–149)
VLDL Cholesterol Cal: 30 mg/dL (ref 5–40)

## 2022-02-17 LAB — CBC WITH DIFFERENTIAL/PLATELET
Basophils Absolute: 0 10*3/uL (ref 0.0–0.2)
Basos: 1 %
EOS (ABSOLUTE): 0.2 10*3/uL (ref 0.0–0.4)
Eos: 3 %
Hematocrit: 43.3 % (ref 37.5–51.0)
Hemoglobin: 14.4 g/dL (ref 13.0–17.7)
Immature Grans (Abs): 0 10*3/uL (ref 0.0–0.1)
Immature Granulocytes: 0 %
Lymphocytes Absolute: 2.3 10*3/uL (ref 0.7–3.1)
Lymphs: 33 %
MCH: 29.9 pg (ref 26.6–33.0)
MCHC: 33.3 g/dL (ref 31.5–35.7)
MCV: 90 fL (ref 79–97)
Monocytes Absolute: 0.7 10*3/uL (ref 0.1–0.9)
Monocytes: 10 %
Neutrophils Absolute: 3.9 10*3/uL (ref 1.4–7.0)
Neutrophils: 53 %
Platelets: 246 10*3/uL (ref 150–450)
RBC: 4.81 x10E6/uL (ref 4.14–5.80)
RDW: 13.2 % (ref 11.6–15.4)
WBC: 7.2 10*3/uL (ref 3.4–10.8)

## 2022-02-17 LAB — CMP14+EGFR
ALT: 25 IU/L (ref 0–44)
AST: 19 IU/L (ref 0–40)
Albumin/Globulin Ratio: 1.7 (ref 1.2–2.2)
Albumin: 4.3 g/dL (ref 3.8–4.8)
Alkaline Phosphatase: 46 IU/L (ref 44–121)
BUN/Creatinine Ratio: 23 (ref 10–24)
BUN: 20 mg/dL (ref 8–27)
Bilirubin Total: 0.2 mg/dL (ref 0.0–1.2)
CO2: 23 mmol/L (ref 20–29)
Calcium: 9.2 mg/dL (ref 8.6–10.2)
Chloride: 104 mmol/L (ref 96–106)
Creatinine, Ser: 0.88 mg/dL (ref 0.76–1.27)
Globulin, Total: 2.6 g/dL (ref 1.5–4.5)
Glucose: 124 mg/dL — ABNORMAL HIGH (ref 70–99)
Potassium: 4.8 mmol/L (ref 3.5–5.2)
Sodium: 141 mmol/L (ref 134–144)
Total Protein: 6.9 g/dL (ref 6.0–8.5)
eGFR: 93 mL/min/{1.73_m2} (ref >59)

## 2022-02-17 LAB — BAYER DCA HB A1C WAIVED: HB A1C (BAYER DCA - WAIVED): 6.6 % — ABNORMAL HIGH (ref 4.8–5.6)

## 2022-02-17 MED ORDER — OMEPRAZOLE 40 MG PO CPDR: 40.0000 mg | DELAYED_RELEASE_CAPSULE | Freq: Every day | ORAL | 3 refills | Status: DC

## 2022-02-17 NOTE — Progress Notes (Signed)
BP 120/69    Pulse 69    Ht '5\' 6"'  (1.676 m)    Wt 185 lb (83.9 kg)    SpO2 99%    BMI 29.86 kg/m    Subjective:   Patient ID: Austin Ortiz, male    DOB: 1951-01-16, 71 y.o.   MRN: [id number]  HPI: Austin Ortiz is a 71 y.o. male presenting on 02/17/2022 for Medical Management of Chronic Issues, Diabetes, Hyperlipidemia, and Hypertension   HPI Type 2 diabetes mellitus Patient comes in today for recheck of his diabetes. Patient has been currently taking glimepiride and Janumet and Farxiga and A1c looks good at 6.6. Patient is currently on an ACE inhibitor/ARB. Patient has not seen an ophthalmologist this year. Patient denies any issues with their feet. The symptom started onset as an adult hypertension and hyperlipidemia and sleep apnea GERD ARE RELATED TO DM   Hypertension Patient is currently on lisinopril hydrochlorothiazide, and their blood pressure today is 120/69. Patient denies any lightheadedness or dizziness. Patient denies headaches, blurred vision, chest pains, shortness of breath, or weakness. Denies any side effects from medication and is content with current medication.   Hyperlipidemia Patient is coming in for recheck of his hyperlipidemia. The patient is currently taking fish oil and simvastatin. They deny any issues with myalgias or history of liver damage from it. They deny any focal numbness or weakness or chest pain.   GERD Patient is currently on omeprazole.  She denies any major symptoms or abdominal pain or belching or burping. She denies any blood in her stool or lightheadedness or dizziness.   Relevant past medical, surgical, family and social history reviewed and updated as indicated. Interim medical history since our last visit reviewed. Allergies and medications reviewed and updated.  Review of Systems  Constitutional:  Negative for chills and fever.  Eyes:  Negative for visual disturbance.  Respiratory:  Negative for shortness of breath and wheezing.    Cardiovascular:  Negative for chest pain and leg swelling.  Musculoskeletal:  Negative for back pain and gait problem.  Skin:  Negative for rash.  Neurological:  Negative for dizziness, weakness and numbness.  All other systems reviewed and are negative.  Per HPI unless specifically indicated above   Allergies as of 02/17/2022       Reactions   Cetuximab    Pt denies         Medication List        Accurate as of February 17, 2022  8:59 AM. If you have any questions, ask your nurse or doctor.          aspirin 81 MG tablet Take 81 mg by mouth daily.   dapagliflozin propanediol 10 MG Tabs tablet Commonly known as: Farxiga Take 1 tablet (10 mg total) by mouth daily.   Fish Oil 600 MG Caps Take 3,000 mg by mouth daily.   glimepiride 4 MG tablet Commonly known as: AMARYL Take 1 tablet (4 mg total) by mouth daily with breakfast.   glucosamine-chondroitin 500-400 MG tablet Take 1 tablet by mouth 3 (three) times daily.   HYDROcodone-acetaminophen 5-325 MG tablet Commonly known as: NORCO/VICODIN Take 1-2 tablets by mouth every 6 (six) hours as needed for moderate pain.   Janumet 50-1000 MG tablet Generic drug: sitaGLIPtin-metformin Take 1 tablet by mouth 2 (two) times daily with a meal.   lisinopril-hydrochlorothiazide 20-12.5 MG tablet Commonly known as: ZESTORETIC Take 1 tablet by mouth daily.   meloxicam 15 MG tablet Commonly known  as: MOBIC Take 1 tablet (15 mg total) by mouth daily.   multivitamin tablet Take 1 tablet by mouth daily.   omeprazole 40 MG capsule Commonly known as: PRILOSEC Take 1 capsule (40 mg total) by mouth daily.   sildenafil 20 MG tablet Commonly known as: REVATIO TAKE 1 TO 3 TABLETS BY MOUTH AS NEEDED   simvastatin 40 MG tablet Commonly known as: ZOCOR Take 1 tablet (40 mg total) by mouth every evening.         Objective:   BP 120/69    Pulse 69    Ht '5\' 6"'  (1.676 m)    Wt 185 lb (83.9 kg)    SpO2 99%    BMI 29.86  kg/m   Wt Readings from Last 3 Encounters:  02/17/22 185 lb (83.9 kg)  11/12/21 192 lb (87.1 kg)  09/16/21 192 lb (87.1 kg)    Physical Exam Vitals and nursing note reviewed.  Constitutional:      General: He is not in acute distress.    Appearance: He is well-developed. He is not diaphoretic.  Eyes:     General: No scleral icterus.    Conjunctiva/sclera: Conjunctivae normal.  Neck:     Thyroid: No thyromegaly.  Cardiovascular:     Rate and Rhythm: Normal rate and regular rhythm.     Heart sounds: Normal heart sounds. No murmur heard. Pulmonary:     Effort: Pulmonary effort is normal. No respiratory distress.     Breath sounds: Normal breath sounds. No wheezing.  Musculoskeletal:        General: Normal range of motion.     Cervical back: Neck supple.  Lymphadenopathy:     Cervical: No cervical adenopathy.  Skin:    General: Skin is warm and dry.     Findings: No rash.  Neurological:     Mental Status: He is alert and oriented to person, place, and time.     Coordination: Coordination normal.  Psychiatric:        Behavior: Behavior normal.      Assessment & Plan:   Problem List Items Addressed This Visit       Cardiovascular and Mediastinum   Hypertension associated with diabetes ([place])   Relevant Orders   CBC with Differential/Platelet   CMP14+EGFR   Lipid panel   Bayer DCA Hb A1c Waived     Digestive   GERD (gastroesophageal reflux disease)   Relevant Medications   omeprazole (PRILOSEC) 40 MG capsule     Endocrine   T2DM (type 2 diabetes mellitus) ([place]) - Primary   Relevant Orders   CBC with Differential/Platelet   CMP14+EGFR   Lipid panel   Bayer DCA Hb A1c Waived   CBC with Differential/Platelet   CMP14+EGFR   Lipid panel   Bayer DCA Hb A1c Waived   Hyperlipidemia associated with type 2 diabetes mellitus ([facility])   Relevant Orders   CBC with Differential/Platelet   CMP14+EGFR   Lipid panel   Bayer DCA Hb A1c Waived   Other Visit Diagnoses      Need for shingles vaccine       Relevant Orders   Varicella-zoster vaccine IM (Shingrix) (Completed)       A1c looks good at 6.6, continue current medicine. Follow up plan: Return in about 3 months (around 05/17/2022), or if symptoms worsen or fail to improve, for Diabetes.  Counseling provided for all of the vaccine components Orders Placed This Encounter  Procedures   Varicella-zoster vaccine IM (Shingrix)  CBC with Differential/Platelet   CMP14+EGFR   Lipid panel   Bayer DCA Hb A1c Waived    Caryl Pina, MD [place] [facility] 02/17/2022, 8:59 AM

## 2022-03-04 ENCOUNTER — Telehealth: Payer: Self-pay

## 2022-03-04 ENCOUNTER — Ambulatory Visit (INDEPENDENT_AMBULATORY_CARE_PROVIDER_SITE_OTHER): Payer: Medicare HMO

## 2022-03-04 VITALS — Wt 185.0 lb

## 2022-03-04 DIAGNOSIS — R400 Somnolence: Secondary | ICD-10-CM | POA: Insufficient documentation

## 2022-03-04 DIAGNOSIS — Z0000 Encounter for general adult medical examination without abnormal findings: Secondary | ICD-10-CM | POA: Diagnosis not present

## 2022-03-04 NOTE — Progress Notes (Signed)
Subjective:   Austin Ortiz is a 71 y.o. male who presents for Medicare Annual/Subsequent preventive examination.  Virtual Visit via Telephone Note  I connected with  Austin Ortiz on 03/04/22 at  3:30 PM EST by telephone and verified that I am speaking with the correct person using two identifiers.  Location: Patient: Home Provider: [facility] Persons participating in the virtual visit: patient/Nurse Health Advisor   I discussed the limitations, risks, security and privacy concerns of performing an evaluation and management service by telephone and the availability of in person appointments. The patient expressed understanding and agreed to proceed.  Interactive audio and video telecommunications were attempted between this nurse and patient, however failed, due to patient having technical difficulties OR patient did not have access to video capability.  We continued and completed visit with audio only.  Some vital signs may be absent or patient reported.   Austin Loney E Fallynn Gravett, LPN   Review of Systems     Cardiac Risk Factors include: advanced age (>19mn, >>44women);diabetes mellitus;dyslipidemia;hypertension;male gender;Other (see comment), Risk factor comments: OSA - has Inspire implant     Objective:    Today's Vitals   03/04/22 1536  Weight: 185 lb (83.9 kg)   Body mass index is 29.86 kg/m.  Advanced Directives 03/04/2022 09/16/2021 05/12/2021 05/03/2021 03/03/2021 07/05/2019 07/03/2018  Does Patient Have a Medical Advance Directive? No No No No No No No  Type of Advance Directive - - - - - - -  Copy of Healthcare Power of Attorney in Chart? - - - No - copy requested - - -  Would patient like information on creating a medical advance directive? No - Patient declined Yes (MAU/Ambulatory/Procedural Areas - Information given) No - Patient declined - No - Patient declined No - Patient declined No - Patient declined  Pre-existing out of facility DNR order (yellow form or pink MOST form) - -  - - - - -    Current Medications (verified) Outpatient Encounter Medications as of 03/04/2022  Medication Sig   aspirin 81 MG tablet Take 81 mg by mouth daily.   dapagliflozin propanediol (FARXIGA) 10 MG TABS tablet Take 1 tablet (10 mg total) by mouth daily.   glimepiride (AMARYL) 4 MG tablet Take 1 tablet (4 mg total) by mouth daily with breakfast.   glucosamine-chondroitin 500-400 MG tablet Take 1 tablet by mouth 3 (three) times daily.   HYDROcodone-acetaminophen (NORCO/VICODIN) 5-325 MG tablet Take 1-2 tablets by mouth every 6 (six) hours as needed for moderate pain.   lisinopril-hydrochlorothiazide (ZESTORETIC) 20-12.5 MG tablet Take 1 tablet by mouth daily.   meloxicam (MOBIC) 15 MG tablet Take 1 tablet (15 mg total) by mouth daily.   Multiple Vitamin (MULTIVITAMIN) tablet Take 1 tablet by mouth daily.   Omega-3 Fatty Acids (FISH OIL) 600 MG CAPS Take 3,000 mg by mouth daily.    omeprazole (PRILOSEC) 40 MG capsule Take 1 capsule (40 mg total) by mouth daily.   sildenafil (REVATIO) 20 MG tablet TAKE 1 TO 3 TABLETS BY MOUTH AS NEEDED   simvastatin (ZOCOR) 40 MG tablet Take 1 tablet (40 mg total) by mouth every evening.   sitaGLIPtin-metformin (JANUMET) 50-1000 MG tablet Take 1 tablet by mouth 2 (two) times daily with a meal.   No facility-administered encounter medications on file as of 03/04/2022.    Allergies (verified) Alpha-gal and Cetuximab   History: Past Medical History:  Diagnosis Date   Diabetes mellitus    Esophageal stricture    Foreign body  in subcutaneous tissue    metal off of a machine, epigastric abd. area   GERD (gastroesophageal reflux disease)    Hyperlipidemia    Hypertension    Kidney stones    Obstructive sleep apnea 05/28/2013   NO cpap use per pt   Past Surgical History:  Procedure Laterality Date   COLONOSCOPY  10/14/2010   [facility]   DRUG INDUCED ENDOSCOPY N/A 03/31/2021   Procedure: DRUG INDUCED ENDOSCOPY;  Surgeon: Melida Quitter, MD;  Location:  [place];  Service: ENT;  Laterality: N/A;   ESOPHAGUS SURGERY  2010   had esophagus stretched   IMPLANTATION OF HYPOGLOSSAL NERVE STIMULATOR Right 05/12/2021   Procedure: IMPLANTATION OF HYPOGLOSSAL NERVE STIMULATOR;  Surgeon: Melida Quitter, MD;  Location: [place];  Service: ENT;  Laterality: Right;   [place]   neg hx     UPPER GASTROINTESTINAL ENDOSCOPY  04/22/2013   [facility]   Family History  Problem Relation Age of Onset   Heart disease Mother    Hypertension Mother    Diabetes type II Paternal Aunt        x2   Nephrolithiasis Maternal Aunt    Breast cancer Maternal Aunt    Heart attack Father        x 2   Diabetes Maternal Aunt    Diabetes Cousin        mat. side   Prostate cancer Brother    Colon cancer Neg Hx    Colon polyps Neg Hx    Esophageal cancer Neg Hx    Rectal cancer Neg Hx    Stomach cancer Neg Hx    Social History   Socioeconomic History   Marital status: Married    Spouse name: [name]   Number of children: 3   Years of education: 12   Highest education level: High school graduate  Occupational History   Occupation: self employed- farmer  Tobacco Use   Smoking status: Never   Smokeless tobacco: Never  Vaping Use   Vaping Use: Never used  Substance and Sexual Activity   Alcohol use: No   Drug use: No   Sexual activity: Yes  Other Topics Concern   Not on file  Social History Narrative   Not on file   Social Determinants of Health   Financial Resource Strain: Medium Risk   Difficulty of Paying Living Expenses: Somewhat hard  Food Insecurity: No Food Insecurity   Worried About Charity fundraiser in the Last Year: Never true   [place] in the Last Year: Never true  Transportation Needs: No Transportation Needs   Lack of Transportation (Medical): No   Lack of Transportation (Non-Medical): No  Physical Activity: Sufficiently Active   Days of Exercise per Week: 5 days   Minutes of  Exercise per Session: 60 min  Stress: No Stress Concern Present   Feeling of Stress : Not at all  Social Connections: Socially Integrated   Frequency of Communication with Friends and Family: More than three times a week   Frequency of Social Gatherings with Friends and Family: More than three times a week   Attends Religious Services: More than 4 times per year   Active Member of [employer] or Organizations: Yes   Attends Music therapist: More than 4 times per year   Marital Status: Married    Tobacco Counseling Counseling given: Not Answered   Clinical Intake:  Pre-visit preparation completed: Yes  Pain : No/denies pain     BMI - recorded: 29.86 Nutritional Status: BMI 25 -29 Overweight Nutritional Risks: None Diabetes: Yes CBG done?: No Did pt. bring in CBG monitor from home?: No  How often do you need to have someone help you when you read instructions, pamphlets, or other written materials from your doctor or pharmacy?: 1 - Never  Diabetic? Nutrition Risk Assessment:  Has the patient had any N/V/D within the last 2 months?  No  Does the patient have any non-healing wounds?  No  Has the patient had any unintentional weight loss or weight gain?  No   Diabetes:  Is the patient diabetic?  Yes  If diabetic, was a CBG obtained today?  No  Did the patient bring in their glucometer from home?  No  How often do you monitor your CBG's? 2-3 times per day - 121 this am per patient.   Financial Strains and Diabetes Management:  Are you having any financial strains with the device, your supplies or your medication? Yes .  Does the patient want to be seen by Chronic Care Management for management of their diabetes?  Yes  Would the patient like to be referred to a Nutritionist or for Diabetic Management?  No   Diabetic Exams:  Diabetic Eye Exam: Completed 07/18/2020. Overdue for diabetic eye exam. Pt has been advised about the importance in completing this exam. He  has an appt next month  Diabetic Foot Exam: Completed 05/10/2021. Pt has been advised about the importance in completing this exam. Pt is scheduled for diabetic foot exam on next appt in June 2023.    Interpreter Needed?: No  Information entered by :: Anastazja Isaac, LPN   Activities of Daily Living In your present state of health, do you have any difficulty performing the following activities: 03/04/2022 05/12/2021  Hearing? Y N  Comment mild -  Vision? N N  Difficulty concentrating or making decisions? N N  Walking or climbing stairs? Y N  Comment has to go slow and take one at a time -  Dressing or bathing? N N  Doing errands, shopping? N -  Preparing Food and eating ? N -  Using the Toilet? N -  In the past six months, have you accidently leaked urine? N -  Do you have problems with loss of bowel control? N -  Managing your Medications? N -  Managing your Finances? N -  Housekeeping or managing your Housekeeping? N -  Some recent data might be hidden    Patient Care Team: Dettinger, Fransisca Kaufmann, MD as PCP - General (Family Medicine) Lavera Guise, [facility] as Pharmacist (Family Medicine)  Indicate any recent Medical Services you may have received from other than [facility] providers in the past year (date may be approximate).     Assessment:   This is a routine wellness examination for Parker.  Hearing/Vision screen Hearing Screening - Comments:: C/o mild hearing difficulties   Vision Screening - Comments:: Wears rx glasses - behind with routine eye exams with [facility]  Dietary issues and exercise activities discussed: Current Exercise Habits: Home exercise routine, Type of exercise: walking;Other - see comments (farm work), Time (Minutes): 60, Frequency (Times/Week): 5, Weekly Exercise (Minutes/Week): 300, Intensity: Moderate, Exercise limited by: None identified   Goals Addressed               This Visit's Progress     DIET - INCREASE WATER INTAKE   On track  Try  to drink 6-8 glasses of water daily      Patient Stated   On track     03/03/2021 AWV Goal: Exercise for General Health  Patient will verbalize understanding of the benefits of increased physical activity: Exercising regularly is important. It will improve your overall fitness, flexibility, and endurance. Regular exercise also will improve your overall health. It can help you control your weight, reduce stress, and improve your bone density. Over the next year, patient will increase physical activity as tolerated with a goal of at least 150 minutes of moderate physical activity per week.  You can tell that you are exercising at a moderate intensity if your heart starts beating faster and you start breathing faster but can still hold a conversation. Moderate-intensity exercise ideas include: Walking 1 mile (1.6 km) in about 15 minutes Biking Hiking Golfing Dancing Water aerobics Patient will verbalize understanding of everyday activities that increase physical activity by providing examples like the following: Yard work, such as: Sales promotion account executive Gardening Washing windows or floors Patient will be able to explain general safety guidelines for exercising:  Before you start a new exercise program, talk with your health care provider. Do not exercise so much that you hurt yourself, feel dizzy, or get very short of breath. Wear comfortable clothes and wear shoes with good support. Drink plenty of water while you exercise to prevent dehydration or heat stroke. Work out until your breathing and your heartbeat get faster.       T2DM PHARMD GOAL (pt-stated)   On track     Current Barriers:  Unable to independently afford treatment regimen Unable to maintain control of T2DM Suboptimal therapeutic regimen for T2DM  Pharmacist Clinical Goal(s):  patient will verbalize ability to afford treatment  regimen achieve improvement in T2DM as evidenced by T2DM through collaboration with PharmD and provider.    Interventions: 1:1 collaboration with Dettinger, Fransisca Kaufmann, MD regarding development and update of comprehensive plan of care as evidenced by provider attestation and co-signature Inter-disciplinary care team collaboration (see longitudinal plan of care) Comprehensive medication review performed; medication list updated in electronic medical record  Diabetes: New goal. Uncontrolled; current treatment: FARXIGA, JANUMET, GLIMEPIRIDE (d/c);  Current glucose readings: fasting glucose: as low as 90, post prandial glucose: <200 Denies hypoglycemic/hyperglycemic symptoms Current meal patterns:  Discussed meal planning options and Plate method for healthy eating Avoid sugary drinks and desserts Incorporate balanced protein, non starchy veggies, 1 serving of carbohydrate with each meal Increase water intake Increase physical activity as able Patient is following the "eat to live"  Current exercise: n/a Recommended blood sugar now at goal with lifestyle/diet changes; will continue current regimen but d/c glimepiride; consider combining janumet and farxiga into trijardy or d/c janumet and convert to GLP1/metformin combo  Assessed patient finances. Apply to [place] patient assistance  Lipid Panel--LDL at goal, continue current regimen     Component Value Date/Time   CHOL 131 08/11/2021 0920   TRIG 250 (H) 08/11/2021 0920   TRIG 140 09/15/2014 1529   HDL 32 (L) 08/11/2021 0920   HDL 39 (L) 09/15/2014 1529   CHOLHDL 4.1 08/11/2021 0920   LDLCALC 59 08/11/2021 0920   LDLCALC 63 09/15/2014 1529   LABVLDL 40 08/11/2021 0920    Patient Goals/Self-Care Activities patient will:  - take medications as prescribed as evidenced by patient report and record review check glucose daily, document, and provide at  future appointments collaborate with provider on medication access solutions engage in  dietary modifications by eat to live method        Depression Screen PHQ 2/9 Scores 03/04/2022 02/17/2022 11/12/2021 08/11/2021 05/10/2021 03/03/2021 02/08/2021  PHQ - 2 Score 0 0 0 0 0 0 0  PHQ- 9 Score 0 - - - - - -    Fall Risk Fall Risk  03/04/2022 02/17/2022 11/12/2021 08/11/2021 05/10/2021  Falls in the past year? 0 0 0 0 0  Number falls in past yr: 0 - - - -  Injury with Fall? 0 - - - -  Risk for fall due to : Orthopedic patient - - - -  Follow up Falls prevention discussed - - - -    FALL Dibble:  Any stairs in or around the home? Yes  If so, are there any without handrails? No  Home free of loose throw rugs in walkways, pet beds, electrical cords, etc? Yes  Adequate lighting in your home to reduce risk of falls? Yes   ASSISTIVE DEVICES UTILIZED TO PREVENT FALLS:  Life alert? No  Use of a cane, walker or w/c? No  Grab bars in the bathroom? Yes  Shower chair or bench in shower? Yes  Elevated toilet seat or a handicapped toilet? No   TIMED UP AND GO:  Was the test performed? No . Telephonic visit  Cognitive Function: Normal cognitive status assessed by direct observation by this Nurse Health Advisor. No abnormalities found.    MMSE - Mini Mental State Exam 07/03/2018  Orientation to time 5  Orientation to Place 4  Registration 3  Attention/ Calculation 4  Recall 3  Language- name 2 objects 2  Language- repeat 1  Language- follow 3 step command 3  Language- read & follow direction 1  Write a sentence 1  Copy design 1  Total score 28     6CIT Screen 03/03/2021 07/05/2019  What Year? 0 points 0 points  What month? 0 points 0 points  What time? 0 points 0 points  Count back from 20 0 points 0 points  Months in reverse 0 points 0 points  Repeat phrase 0 points 2 points  Total Score 0 2    Immunizations Immunization History  Administered Date(s) Administered   Fluad Quad(high Dose 65+) 10/21/2019   Influenza, High Dose Seasonal PF  11/27/2017   Influenza,inj,Quad PF,6+ Mos 09/25/2013, 10/01/2016   Influenza-Unspecified 09/26/2015, 11/30/2018, 10/06/2020, 09/27/2021   Moderna Sars-Covid-2 Vaccination 01/16/2020, 02/21/2020, 10/31/2020   Pneumococcal Conjugate-13 12/13/2013   Pneumococcal Polysaccharide-23 07/03/2018   Tdap 03/29/2012   Zoster Recombinat (Shingrix) 08/11/2021, 02/17/2022   Zoster, Live 07/15/2015    TDAP status: Up to date  Flu Vaccine status: Up to date  Pneumococcal vaccine status: Up to date  Covid-19 vaccine status: Completed vaccines  Qualifies for Shingles Vaccine? Yes   Zostavax completed Yes   Shingrix Completed?: Yes  Screening Tests Health Maintenance  Topic Date Due   OPHTHALMOLOGY EXAM  03/14/2022 (Originally 07/08/2021)   COVID-19 Vaccine (4 - Booster for Moderna series) 08/08/2022 (Originally 12/26/2020)   TETANUS/TDAP  04/08/2022   FOOT EXAM  05/10/2022   HEMOGLOBIN A1C  08/17/2022   COLONOSCOPY (Pts 45-11yr Insurance coverage will need to be confirmed)  01/23/2024   Pneumonia Vaccine 70 Years old  Completed   INFLUENZA VACCINE  Completed   Hepatitis C Screening  Completed   Zoster Vaccines- Shingrix  Completed   HPV VACCINES  Aged  Out   COLON CANCER SCREENING ANNUAL FOBT  Discontinued    Health Maintenance  There are no preventive care reminders to display for this patient.  Colorectal cancer screening: Type of screening: Colonoscopy. Completed 01/22/2021. Repeat every 3 years  Lung Cancer Screening: (Low Dose CT Chest recommended if Age 40-80 years, 30 pack-year currently smoking OR have quit w/in 15years.) does not qualify.   Additional Screening:  Hepatitis C Screening: does qualify; Completed 01/30/2020  Vision Screening: Recommended annual ophthalmology exams for early detection of glaucoma and other disorders of the eye. Is the patient up to date with their annual eye exam?  No  Who is the provider or what is the name of the office in which the patient  attends annual eye exams? [place] If pt is not established with a provider, would they like to be referred to a provider to establish care? No .   Dental Screening: Recommended annual dental exams for proper oral hygiene  Community Resource Referral / Chronic Care Management: CRR required this visit?  No   CCM required this visit?  No      Plan:     I have personally reviewed and noted the following in the patients chart:   Medical and social history Use of alcohol, tobacco or illicit drugs  Current medications and supplements including opioid prescriptions. Patient is not currently taking opioid prescriptions. Functional ability and status Nutritional status Physical activity Advanced directives List of other physicians Hospitalizations, surgeries, and ER visits in previous 12 months Vitals Screenings to include cognitive, depression, and falls Referrals and appointments  In addition, I have reviewed and discussed with patient certain preventive protocols, quality metrics, and best practice recommendations. A written personalized care plan for preventive services as well as general preventive health recommendations were provided to patient.     Sandrea Hammond, LPN   [id number]   Nurse Notes: None

## 2022-03-04 NOTE — Patient Instructions (Signed)
Austin Ortiz , Thank you for taking time to come for your Medicare Wellness Visit. I appreciate your ongoing commitment to your health goals. Please review the following plan we discussed and let me know if I can assist you in the future.   Screening recommendations/referrals: Colonoscopy: Done 01/22/2021 - repeat in 3 years Recommended yearly ophthalmology/optometry visit for glaucoma screening and checkup Recommended yearly dental visit for hygiene and checkup  Vaccinations: Influenza vaccine: Done 09/24/21 - Repeat annually Pneumococcal vaccine: Done 12/13/2013 & 07/03/2018 Tdap vaccine: Done 03/29/2012 - Repeat in 10 years *this year Shingles vaccine: Done 08/11/2021 & 02/17/2022   Covid-19: Done  01/16/20, 02/21/20, 10/31/20  Advanced directives: Advance directive discussed with you today. Even though you declined this today, please call our office should you change your mind, and we can give you the proper paperwork for you to fill out.   Conditions/risks identified: Aim for 30 minutes of exercise or brisk walking, 6-8 glasses of water, and 5 servings of fruits and vegetables each day.   Next appointment: Follow up in one year for your annual wellness visit.   Preventive Care 3 Years and Older, Male  Preventive care refers to lifestyle choices and visits with your health care provider that can promote health and wellness. What does preventive care include? A yearly physical exam. This is also called an annual well check. Dental exams once or twice a year. Routine eye exams. Ask your health care provider how often you should have your eyes checked. Personal lifestyle choices, including: Daily care of your teeth and gums. Regular physical activity. Eating a healthy diet. Avoiding tobacco and drug use. Limiting alcohol use. Practicing safe sex. Taking low doses of aspirin every day. Taking vitamin and mineral supplements as recommended by your health care provider. What happens during an  annual well check? The services and screenings done by your health care provider during your annual well check will depend on your age, overall health, lifestyle risk factors, and family history of disease. Counseling  Your health care provider may ask you questions about your: Alcohol use. Tobacco use. Drug use. Emotional well-being. Home and relationship well-being. Sexual activity. Eating habits. History of falls. Memory and ability to understand (cognition). Work and work Statistician. Screening  You may have the following tests or measurements: Height, weight, and BMI. Blood pressure. Lipid and cholesterol levels. These may be checked every 5 years, or more frequently if you are over 53 years old. Skin check. Lung cancer screening. You may have this screening every year starting at age 70 if you have a 30-pack-year history of smoking and currently smoke or have quit within the past 15 years. Fecal occult blood test (FOBT) of the stool. You may have this test every year starting at age 85. Flexible sigmoidoscopy or colonoscopy. You may have a sigmoidoscopy every 5 years or a colonoscopy every 10 years starting at age 86. Prostate cancer screening. Recommendations will vary depending on your family history and other risks. Hepatitis C blood test. Hepatitis B blood test. Sexually transmitted disease (STD) testing. Diabetes screening. This is done by checking your blood sugar (glucose) after you have not eaten for a while (fasting). You may have this done every 1-3 years. Abdominal aortic aneurysm (AAA) screening. You may need this if you are a current or former smoker. Osteoporosis. You may be screened starting at age 41 if you are at high risk. Talk with your health care provider about your test results, treatment options, and if necessary,  the need for more tests. Vaccines  Your health care provider may recommend certain vaccines, such as: Influenza vaccine. This is recommended  every year. Tetanus, diphtheria, and acellular pertussis (Tdap, Td) vaccine. You may need a Td booster every 10 years. Zoster vaccine. You may need this after age 43. Pneumococcal 13-valent conjugate (PCV13) vaccine. One dose is recommended after age 32. Pneumococcal polysaccharide (PPSV23) vaccine. One dose is recommended after age 20. Talk to your health care provider about which screenings and vaccines you need and how often you need them. This information is not intended to replace advice given to you by your health care provider. Make sure you discuss any questions you have with your health care provider. Document Released: 01/08/2016 Document Revised: 08/31/2016 Document Reviewed: 10/13/2015 Elsevier Interactive Patient Education  2017 [place] Prevention in the Home Falls can cause injuries. They can happen to people of all ages. There are many things you can do to make your home safe and to help prevent falls. What can I do on the outside of my home? Regularly fix the edges of walkways and driveways and fix any cracks. Remove anything that might make you trip as you walk through a door, such as a raised step or threshold. Trim any bushes or trees on the path to your home. Use bright outdoor lighting. Clear any walking paths of anything that might make someone trip, such as rocks or tools. Regularly check to see if handrails are loose or broken. Make sure that both sides of any steps have handrails. Any raised decks and porches should have guardrails on the edges. Have any leaves, snow, or ice cleared regularly. Use sand or salt on walking paths during winter. Clean up any spills in your garage right away. This includes oil or grease spills. What can I do in the bathroom? Use night lights. Install grab bars by the toilet and in the tub and shower. Do not use towel bars as grab bars. Use non-skid mats or decals in the tub or shower. If you need to sit down in the shower,  use a plastic, non-slip stool. Keep the floor dry. Clean up any water that spills on the floor as soon as it happens. Remove soap buildup in the tub or shower regularly. Attach bath mats securely with double-sided non-slip rug tape. Do not have throw rugs and other things on the floor that can make you trip. What can I do in the bedroom? Use night lights. Make sure that you have a light by your bed that is easy to reach. Do not use any sheets or blankets that are too big for your bed. They should not hang down onto the floor. Have a firm chair that has side arms. You can use this for support while you get dressed. Do not have throw rugs and other things on the floor that can make you trip. What can I do in the kitchen? Clean up any spills right away. Avoid walking on wet floors. Keep items that you use a lot in easy-to-reach places. If you need to reach something above you, use a strong step stool that has a grab bar. Keep electrical cords out of the way. Do not use floor polish or wax that makes floors slippery. If you must use wax, use non-skid floor wax. Do not have throw rugs and other things on the floor that can make you trip. What can I do with my stairs? Do not leave any items on  the stairs. Make sure that there are handrails on both sides of the stairs and use them. Fix handrails that are broken or loose. Make sure that handrails are as long as the stairways. Check any carpeting to make sure that it is firmly attached to the stairs. Fix any carpet that is loose or worn. Avoid having throw rugs at the top or bottom of the stairs. If you do have throw rugs, attach them to the floor with carpet tape. Make sure that you have a light switch at the top of the stairs and the bottom of the stairs. If you do not have them, ask someone to add them for you. What else can I do to help prevent falls? Wear shoes that: Do not have high heels. Have rubber bottoms. Are comfortable and fit you  well. Are closed at the toe. Do not wear sandals. If you use a stepladder: Make sure that it is fully opened. Do not climb a closed stepladder. Make sure that both sides of the stepladder are locked into place. Ask someone to hold it for you, if possible. Clearly mark and make sure that you can see: Any grab bars or handrails. First and last steps. Where the edge of each step is. Use tools that help you move around (mobility aids) if they are needed. These include: Canes. Walkers. Scooters. Crutches. Turn on the lights when you go into a dark area. Replace any light bulbs as soon as they burn out. Set up your furniture so you have a clear path. Avoid moving your furniture around. If any of your floors are uneven, fix them. If there are any pets around you, be aware of where they are. Review your medicines with your doctor. Some medicines can make you feel dizzy. This can increase your chance of falling. Ask your doctor what other things that you can do to help prevent falls. This information is not intended to replace advice given to you by your health care provider. Make sure you discuss any questions you have with your health care provider. Document Released: 10/08/2009 Document Revised: 05/19/2016 Document Reviewed: 01/16/2015 Elsevier Interactive Patient Education  2017 [employer].

## 2022-03-07 NOTE — Telephone Encounter (Signed)
Please call patient regarding assistance with DM meds. Thanks! ?

## 2022-03-08 NOTE — Telephone Encounter (Signed)
Austin Ortiz, Can you check to see if you have patient enrolled? I will resend--[employer]me may not have gotten over the holidays ? ?Can you let patient now we will resubmit? ? ?Thank you! ?

## 2022-03-09 NOTE — Telephone Encounter (Signed)
Informed pt that application was submitted yesterday.  ?

## 2022-03-21 DIAGNOSIS — E109 Type 1 diabetes mellitus without complications: Secondary | ICD-10-CM | POA: Diagnosis not present

## 2022-03-21 DIAGNOSIS — Z0100 Encounter for examination of eyes and vision without abnormal findings: Secondary | ICD-10-CM | POA: Diagnosis not present

## 2022-03-21 DIAGNOSIS — E7800 Pure hypercholesterolemia, unspecified: Secondary | ICD-10-CM | POA: Diagnosis not present

## 2022-03-21 DIAGNOSIS — H5200 Hypermetropia, unspecified eye: Secondary | ICD-10-CM | POA: Diagnosis not present

## 2022-03-21 DIAGNOSIS — H35363 Drusen (degenerative) of macula, bilateral: Secondary | ICD-10-CM | POA: Diagnosis not present

## 2022-04-08 DIAGNOSIS — G4733 Obstructive sleep apnea (adult) (pediatric): Secondary | ICD-10-CM | POA: Diagnosis not present

## 2022-04-08 DIAGNOSIS — E119 Type 2 diabetes mellitus without complications: Secondary | ICD-10-CM | POA: Diagnosis not present

## 2022-04-08 DIAGNOSIS — I10 Essential (primary) hypertension: Secondary | ICD-10-CM | POA: Diagnosis not present

## 2022-04-08 DIAGNOSIS — E669 Obesity, unspecified: Secondary | ICD-10-CM | POA: Diagnosis not present

## 2022-04-19 ENCOUNTER — Other Ambulatory Visit: Payer: Self-pay | Admitting: Family Medicine

## 2022-04-20 ENCOUNTER — Telehealth: Payer: Self-pay

## 2022-04-20 DIAGNOSIS — E1169 Type 2 diabetes mellitus with other specified complication: Secondary | ICD-10-CM

## 2022-04-20 NOTE — Telephone Encounter (Signed)
Received notification from [employer] regarding approval for [facility]. Patient assistance approved from 03/10/22 to 12/25/22. ? ?Medication sent to pharmacy ([facility]) for shipment processing ? ?Phone: [phone] ? ?

## 2022-04-28 ENCOUNTER — Telehealth: Payer: Self-pay | Admitting: Family Medicine

## 2022-04-29 NOTE — Telephone Encounter (Signed)
Patient was approved for farxiga patient assistance--he can call number below to check on status: ? ?Received notification from [employer] regarding approval for [facility]. Patient assistance approved from 03/10/22 to 12/25/22. ?  ?Medication sent to pharmacy ([facility]) for shipment processing ?  ?Phone: [phone] ?

## 2022-04-29 NOTE — Telephone Encounter (Signed)
[name] informed. She will call [facility] to see when the [place] will ship. Number given. ?

## 2022-05-02 ENCOUNTER — Encounter: Payer: Self-pay | Admitting: Family Medicine

## 2022-05-03 MED ORDER — DAPAGLIFLOZIN PROPANEDIOL 10 MG PO TABS: 10.0000 mg | ORAL_TABLET | Freq: Every day | ORAL | 5 refills | Status: DC

## 2022-05-03 NOTE — Telephone Encounter (Signed)
Application had "Eliberto joyce" listed as name on RX for Farxiga-->should be "Roman Dubuc" ?I corrected and sent to AZ[employer]me via fax ?I also escribed farxiga to [facility] ?If you could follow please! ?

## 2022-05-03 NOTE — Addendum Note (Signed)
Addended by: Lottie Dawson D on: 05/03/2022 08:31 AM ? ? Modules accepted: Orders ? ?

## 2022-05-26 ENCOUNTER — Ambulatory Visit (INDEPENDENT_AMBULATORY_CARE_PROVIDER_SITE_OTHER): Payer: Medicare HMO | Admitting: Family Medicine

## 2022-05-26 ENCOUNTER — Encounter: Payer: Self-pay | Admitting: Family Medicine

## 2022-05-26 VITALS — BP 121/73 | HR 76 | Temp 98.0°F | Ht 66.0 in | Wt 181.0 lb

## 2022-05-26 DIAGNOSIS — Z23 Encounter for immunization: Secondary | ICD-10-CM

## 2022-05-26 DIAGNOSIS — E1159 Type 2 diabetes mellitus with other circulatory complications: Secondary | ICD-10-CM

## 2022-05-26 DIAGNOSIS — I152 Hypertension secondary to endocrine disorders: Secondary | ICD-10-CM

## 2022-05-26 DIAGNOSIS — E785 Hyperlipidemia, unspecified: Secondary | ICD-10-CM

## 2022-05-26 DIAGNOSIS — E1169 Type 2 diabetes mellitus with other specified complication: Secondary | ICD-10-CM

## 2022-05-26 LAB — BAYER DCA HB A1C WAIVED: HB A1C (BAYER DCA - WAIVED): 6.6 % — ABNORMAL HIGH (ref 4.8–5.6)

## 2022-05-26 MED ORDER — GLIMEPIRIDE 4 MG PO TABS: 4.0000 mg | ORAL_TABLET | Freq: Every day | ORAL | 3 refills | Status: DC

## 2022-05-26 MED ORDER — JANUMET 50-1000 MG PO TABS
1.0000 | ORAL_TABLET | Freq: Two times a day (BID) | ORAL | 3 refills | Status: DC
Start: 2022-05-26 — End: 2023-02-21

## 2022-05-26 MED ORDER — SIMVASTATIN 40 MG PO TABS: 40.0000 mg | ORAL_TABLET | Freq: Every evening | ORAL | 3 refills | Status: DC

## 2022-05-26 NOTE — Progress Notes (Signed)
BP 121/73   Pulse 76   Temp 98 F (36.7 C)   Ht '5\' 6"'$  (1.676 m)   Wt 181 lb (82.1 kg)   SpO2 97%   BMI 29.21 kg/m    Subjective:   Patient ID: Austin Ortiz, male    DOB: 04-19-1951, 71 y.o.   MRN: [id number]  HPI: Austin Ortiz is a 71 y.o. male presenting on 05/26/2022 for Medical Management of Chronic Issues and Diabetes   HPI Type 2 diabetes mellitus Patient comes in today for recheck of his diabetes. Patient has been currently taking Janumet and [place] and glimepiride. Patient is currently on an ACE inhibitor/ARB. Patient has not seen an ophthalmologist this year. Patient denies any issues with their feet. The symptom started onset as an adult hypertension and hyperlipidemia ARE RELATED TO DM   Hypertension Patient is currently on lisinopril hydrochlorothiazide, and their blood pressure today is 121/73. Patient denies any lightheadedness or dizziness. Patient denies headaches, blurred vision, chest pains, shortness of breath, or weakness. Denies any side effects from medication and is content with current medication.   Hyperlipidemia Patient is coming in for recheck of his hyperlipidemia. The patient is currently taking simvastatin and fish oils. They deny any issues with myalgias or history of liver damage from it. They deny any focal numbness or weakness or chest pain.   Relevant past medical, surgical, family and social history reviewed and updated as indicated. Interim medical history since our last visit reviewed. Allergies and medications reviewed and updated.  Review of Systems  Constitutional:  Negative for chills and fever.  Eyes:  Negative for visual disturbance.  Respiratory:  Negative for shortness of breath and wheezing.   Cardiovascular:  Negative for chest pain and leg swelling.  Musculoskeletal:  Negative for back pain and gait problem.  Skin:  Negative for rash.  Neurological:  Negative for dizziness, weakness and numbness.  All other systems  reviewed and are negative.  Per HPI unless specifically indicated above   Allergies as of 05/26/2022       Reactions   Alpha-gal    Cetuximab    Pt denies         Medication List        Accurate as of May 26, 2022  8:49 AM. If you have any questions, ask your nurse or doctor.          aspirin 81 MG tablet Take 81 mg by mouth daily.   dapagliflozin propanediol 10 MG Tabs tablet Commonly known as: Farxiga Take 1 tablet (10 mg total) by mouth daily.   Fish Oil 600 MG Caps Take 3,000 mg by mouth daily.   glimepiride 4 MG tablet Commonly known as: AMARYL Take 1 tablet (4 mg total) by mouth daily with breakfast.   glucosamine-chondroitin 500-400 MG tablet Take 1 tablet by mouth 3 (three) times daily.   Janumet 50-1000 MG tablet Generic drug: sitaGLIPtin-metformin Take 1 tablet by mouth 2 (two) times daily with a meal.   lisinopril-hydrochlorothiazide 20-12.5 MG tablet Commonly known as: ZESTORETIC Take 1 tablet by mouth daily.   meloxicam 15 MG tablet Commonly known as: MOBIC Take 1 tablet (15 mg total) by mouth daily.   multivitamin tablet Take 1 tablet by mouth daily.   omeprazole 40 MG capsule Commonly known as: PRILOSEC Take 1 capsule (40 mg total) by mouth daily.   sildenafil 20 MG tablet Commonly known as: REVATIO TAKE 1 TO 3 TABLETS BY MOUTH AS NEEDED   simvastatin  40 MG tablet Commonly known as: ZOCOR Take 1 tablet (40 mg total) by mouth every evening.         Objective:   BP 121/73   Pulse 76   Temp 98 F (36.7 C)   Ht '5\' 6"'$  (1.676 m)   Wt 181 lb (82.1 kg)   SpO2 97%   BMI 29.21 kg/m   Wt Readings from Last 3 Encounters:  05/26/22 181 lb (82.1 kg)  03/04/22 185 lb (83.9 kg)  02/17/22 185 lb (83.9 kg)    Physical Exam Vitals and nursing note reviewed.  Constitutional:      General: He is not in acute distress.    Appearance: He is well-developed. He is not diaphoretic.  Eyes:     General: No scleral icterus.     Conjunctiva/sclera: Conjunctivae normal.  Neck:     Thyroid: No thyromegaly.  Cardiovascular:     Rate and Rhythm: Normal rate and regular rhythm.     Heart sounds: Normal heart sounds. No murmur heard. Pulmonary:     Effort: Pulmonary effort is normal. No respiratory distress.     Breath sounds: Normal breath sounds. No wheezing.  Musculoskeletal:        General: No swelling. Normal range of motion.     Cervical back: Neck supple.  Lymphadenopathy:     Cervical: No cervical adenopathy.  Skin:    General: Skin is warm and dry.     Findings: No rash.  Neurological:     Mental Status: He is alert and oriented to person, place, and time.     Coordination: Coordination normal.  Psychiatric:        Behavior: Behavior normal.      Assessment & Plan:   Problem List Items Addressed This Visit       Cardiovascular and Mediastinum   Hypertension associated with diabetes ([place])   Relevant Medications   glimepiride (AMARYL) 4 MG tablet   simvastatin (ZOCOR) 40 MG tablet   sitaGLIPtin-metformin (JANUMET) 50-1000 MG tablet     Endocrine   T2DM (type 2 diabetes mellitus) (HCC) - Primary   Relevant Medications   glimepiride (AMARYL) 4 MG tablet   simvastatin (ZOCOR) 40 MG tablet   sitaGLIPtin-metformin (JANUMET) 50-1000 MG tablet   Other Relevant Orders   Bayer DCA Hb A1c Waived   Hyperlipidemia associated with type 2 diabetes mellitus (HCC)   Relevant Medications   glimepiride (AMARYL) 4 MG tablet   simvastatin (ZOCOR) 40 MG tablet   sitaGLIPtin-metformin (JANUMET) 50-1000 MG tablet    Continue current medicine, A1c looks good at 6.6.  He does fluctuate up sometimes at home in the 200s but most of the time runs pretty good.  Continue to focus on diet. Follow up plan: Return in about 3 months (around 08/26/2022), or if symptoms worsen or fail to improve, for Diabetes and hypertension..  Counseling provided for all of the vaccine components Orders Placed This Encounter   Procedures   Bayer [facility] Hb A1c [place] Jerimyah Vandunk, MD [facility] 05/26/2022, 8:49 AM

## 2022-05-26 NOTE — Addendum Note (Signed)
Addended by: Alphonzo Dublin on: 05/26/2022 08:53 AM   Modules accepted: Orders

## 2022-06-03 ENCOUNTER — Encounter: Payer: Self-pay | Admitting: Family Medicine

## 2022-06-03 ENCOUNTER — Ambulatory Visit (INDEPENDENT_AMBULATORY_CARE_PROVIDER_SITE_OTHER): Payer: Medicare HMO | Admitting: Family Medicine

## 2022-06-03 VITALS — BP 125/69 | HR 79 | Temp 97.8°F | Ht 66.0 in | Wt 176.1 lb

## 2022-06-03 DIAGNOSIS — K219 Gastro-esophageal reflux disease without esophagitis: Secondary | ICD-10-CM | POA: Diagnosis not present

## 2022-06-03 DIAGNOSIS — Z91018 Allergy to other foods: Secondary | ICD-10-CM

## 2022-06-03 DIAGNOSIS — R197 Diarrhea, unspecified: Secondary | ICD-10-CM

## 2022-06-03 DIAGNOSIS — R630 Anorexia: Secondary | ICD-10-CM | POA: Diagnosis not present

## 2022-06-03 LAB — CBC WITH DIFFERENTIAL/PLATELET
Basophils Absolute: 0.1 10*3/uL (ref 0.0–0.2)
Basos: 1 %
EOS (ABSOLUTE): 0.1 10*3/uL (ref 0.0–0.4)
Eos: 1 %
Hematocrit: 46.4 % (ref 37.5–51.0)
Hemoglobin: 15.6 g/dL (ref 13.0–17.7)
Immature Grans (Abs): 0 10*3/uL (ref 0.0–0.1)
Immature Granulocytes: 0 %
Lymphocytes Absolute: 2.5 10*3/uL (ref 0.7–3.1)
Lymphs: 23 %
MCH: 30.5 pg (ref 26.6–33.0)
MCHC: 33.6 g/dL (ref 31.5–35.7)
MCV: 91 fL (ref 79–97)
Monocytes Absolute: 1 10*3/uL — ABNORMAL HIGH (ref 0.1–0.9)
Monocytes: 9 %
Neutrophils Absolute: 7.4 10*3/uL — ABNORMAL HIGH (ref 1.4–7.0)
Neutrophils: 66 %
Platelets: 395 10*3/uL (ref 150–450)
RBC: 5.12 x10E6/uL (ref 4.14–5.80)
RDW: 13.1 % (ref 11.6–15.4)
WBC: 11 10*3/uL — ABNORMAL HIGH (ref 3.4–10.8)

## 2022-06-03 LAB — BMP8+EGFR
BUN/Creatinine Ratio: 26 — ABNORMAL HIGH (ref 10–24)
BUN: 22 mg/dL (ref 8–27)
CO2: 17 mmol/L — ABNORMAL LOW (ref 20–29)
Calcium: 8.9 mg/dL (ref 8.6–10.2)
Chloride: 97 mmol/L (ref 96–106)
Creatinine, Ser: 0.86 mg/dL (ref 0.76–1.27)
Glucose: 139 mg/dL — ABNORMAL HIGH (ref 70–99)
Potassium: 4.3 mmol/L (ref 3.5–5.2)
Sodium: 130 mmol/L — ABNORMAL LOW (ref 134–144)
eGFR: 94 mL/min/{1.73_m2} (ref >59)

## 2022-06-03 NOTE — Progress Notes (Signed)
   Acute Office Visit  Subjective:     Patient ID: Austin Ortiz, male    DOB: 04-17-1951, 71 y.o.   MRN: [id number]  Chief Complaint  Patient presents with   Diarrhea    Diarrhea    Here with wife today. Patient is in today for diarrhea. This started about 2 weeks ago. He has a history of alpha gal. He had eaten some pork when his diarrhea symptoms started. He also has had pintos with pork a few times since symptoms started. He was having 5-6 episodes of diarrhea a day. This improved and then worsened again about 1 week ago after getting a Boostrix vaccine. His symptoms have been improving again. He is now having 2-3 episodes of watery diarrhea a day. He has some intermittent abdominal cramping, bloating, decreased appetite, and indigestion. He does have a history of GERD. He takes prilosec for this. He has lost 5 lbs. He denies fever, nausea, or vomiting. Denies blood or mucus in stool. He has not tired any OTC remedies.   ROS As per HPI.      Objective:    BP 125/69   Pulse 79   Temp 97.8 F (36.6 C) (Temporal)   Ht _0  (1.676 m)   Wt 176 lb 2 oz (79.9 kg)   SpO2 99%   BMI 28.43 kg/m    Physical Exam Vitals and nursing note reviewed.  Constitutional:      General: He is not in acute distress.    Appearance: He is not ill-appearing, toxic-appearing or diaphoretic.  HENT:     Mouth/Throat:     Mouth: Mucous membranes are moist.     Pharynx: Oropharynx is clear.  Cardiovascular:     Rate and Rhythm: Normal rate and regular rhythm.     Heart sounds: Normal heart sounds. No murmur heard. Abdominal:     General: Bowel sounds are normal. There is no distension.     Palpations: Abdomen is soft.     Tenderness: There is no abdominal tenderness. There is no guarding or rebound.  Musculoskeletal:     Right lower leg: No edema.     Left lower leg: No edema.  Skin:    General: Skin is warm and dry.  Neurological:     General: No focal deficit present.     Mental  Status: He is alert and oriented to person, place, and time.  Psychiatric:        Mood and Affect: Mood normal.        Behavior: Behavior normal.        Thought Content: Thought content normal.    No results found for any visits on 06/03/22.      Assessment & Plan:   [name] was seen today for diarrhea.  Diagnoses and all orders for this visit:  Acute diarrhea Allergy to alpha-gal Gastroesophageal reflux disease without esophagitis Discussed diarrhea in relation to alpha gal and avoidance of pork, beef, lamb, diary, and medications with gelatin. Symptoms are improving. Discussed immodium, hydration, and pepcid for GERD symptoms. Will check labs as below. Discussed will send in abx if no better after the weekend. Strict return precautions given.  -     BMP8+EGFR -     CBC with Differential/Platelet  Return if symptoms worsen or fail to improve.  The patient indicates understanding of these issues and agrees with the plan.  Gwenlyn Perking, FNP

## 2022-06-29 ENCOUNTER — Other Ambulatory Visit: Payer: Self-pay | Admitting: Family Medicine

## 2022-08-11 DIAGNOSIS — Z6829 Body mass index (BMI) 29.0-29.9, adult: Secondary | ICD-10-CM | POA: Diagnosis not present

## 2022-08-11 DIAGNOSIS — E785 Hyperlipidemia, unspecified: Secondary | ICD-10-CM | POA: Diagnosis not present

## 2022-08-11 DIAGNOSIS — K08409 Partial loss of teeth, unspecified cause, unspecified class: Secondary | ICD-10-CM | POA: Diagnosis not present

## 2022-08-11 DIAGNOSIS — Z7982 Long term (current) use of aspirin: Secondary | ICD-10-CM | POA: Diagnosis not present

## 2022-08-11 DIAGNOSIS — M1990 Unspecified osteoarthritis, unspecified site: Secondary | ICD-10-CM | POA: Diagnosis not present

## 2022-08-11 DIAGNOSIS — Z7984 Long term (current) use of oral hypoglycemic drugs: Secondary | ICD-10-CM | POA: Diagnosis not present

## 2022-08-11 DIAGNOSIS — Z791 Long term (current) use of non-steroidal anti-inflammatories (NSAID): Secondary | ICD-10-CM | POA: Diagnosis not present

## 2022-08-11 DIAGNOSIS — E669 Obesity, unspecified: Secondary | ICD-10-CM | POA: Diagnosis not present

## 2022-08-11 DIAGNOSIS — K219 Gastro-esophageal reflux disease without esophagitis: Secondary | ICD-10-CM | POA: Diagnosis not present

## 2022-08-11 DIAGNOSIS — E119 Type 2 diabetes mellitus without complications: Secondary | ICD-10-CM | POA: Diagnosis not present

## 2022-08-11 DIAGNOSIS — I10 Essential (primary) hypertension: Secondary | ICD-10-CM | POA: Diagnosis not present

## 2022-09-01 ENCOUNTER — Ambulatory Visit (INDEPENDENT_AMBULATORY_CARE_PROVIDER_SITE_OTHER): Payer: Medicare HMO | Admitting: Family Medicine

## 2022-09-01 ENCOUNTER — Encounter: Payer: Self-pay | Admitting: Family Medicine

## 2022-09-01 VITALS — BP 96/55 | HR 64 | Temp 98.0°F | Ht 66.0 in | Wt 180.0 lb

## 2022-09-01 DIAGNOSIS — I152 Hypertension secondary to endocrine disorders: Secondary | ICD-10-CM | POA: Diagnosis not present

## 2022-09-01 DIAGNOSIS — E785 Hyperlipidemia, unspecified: Secondary | ICD-10-CM | POA: Diagnosis not present

## 2022-09-01 DIAGNOSIS — E1159 Type 2 diabetes mellitus with other circulatory complications: Secondary | ICD-10-CM

## 2022-09-01 DIAGNOSIS — E1169 Type 2 diabetes mellitus with other specified complication: Secondary | ICD-10-CM | POA: Diagnosis not present

## 2022-09-01 LAB — BAYER DCA HB A1C WAIVED: HB A1C (BAYER DCA - WAIVED): 6 % — ABNORMAL HIGH (ref 4.8–5.6)

## 2022-09-01 MED ORDER — LISINOPRIL-HYDROCHLOROTHIAZIDE 10-12.5 MG PO TABS: 1.0000 | ORAL_TABLET | Freq: Every day | ORAL | 3 refills | Status: DC

## 2022-09-01 MED ORDER — MELOXICAM 15 MG PO TABS
15.0000 mg | ORAL_TABLET | Freq: Every day | ORAL | 3 refills | Status: DC
Start: 2022-09-01 — End: 2023-06-08

## 2022-09-01 NOTE — Progress Notes (Signed)
BP (!) 96/55   Pulse 64   Temp 98 F (36.7 C)   Ht '5\' 6"'$  (1.676 m)   Wt 180 lb (81.6 kg)   SpO2 98%   BMI 29.05 kg/m    Subjective:   Patient ID: Austin Ortiz, male    DOB: 1951-03-14, 71 y.o.   MRN: [id number]  HPI: Austin Ortiz is a 71 y.o. male presenting on 09/01/2022 for Medical Management of Chronic Issues and Diabetes   HPI Type 2 diabetes mellitus Patient comes in today for recheck of his diabetes. Patient has been currently taking Farxiga, glimepiride and Janumet. Patient is currently on an ACE inhibitor/ARB. Patient has not seen an ophthalmologist this year. Patient denies any issues with their feet. The symptom started onset as an adult hypertension and hyperlipidemia ARE RELATED TO DM   Hypertension Patient is currently on lisinopril-hydrochlorothiazide, and their blood pressure today is 96/55. Patient denies any lightheadedness or dizziness. Patient denies headaches, blurred vision, chest pains, shortness of breath, or weakness. Denies any side effects from medication and is content with current medication.   Hyperlipidemia Patient is coming in for recheck of his hyperlipidemia. The patient is currently taking simvastatin and fish oils. They deny any issues with myalgias or history of liver damage from it. They deny any focal numbness or weakness or chest pain.   Relevant past medical, surgical, family and social history reviewed and updated as indicated. Interim medical history since our last visit reviewed. Allergies and medications reviewed and updated.  Review of Systems  Constitutional:  Negative for chills and fever.  Eyes:  Negative for visual disturbance.  Respiratory:  Negative for shortness of breath and wheezing.   Cardiovascular:  Negative for chest pain and leg swelling.  Musculoskeletal:  Negative for back pain and gait problem.  Skin:  Negative for rash.  Neurological:  Negative for dizziness, weakness and light-headedness.  All other systems  reviewed and are negative.   Per HPI unless specifically indicated above   Allergies as of 09/01/2022       Reactions   Alpha-gal    Cetuximab    Pt denies         Medication List        Accurate as of September 01, 2022  8:34 AM. If you have any questions, ask your nurse or doctor.          STOP taking these medications    lisinopril-hydrochlorothiazide 20-12.5 MG tablet Commonly known as: ZESTORETIC Replaced by: lisinopril-hydrochlorothiazide 10-12.5 MG tablet Stopped by: Fransisca Kaufmann Raffaella Edison, MD       TAKE these medications    aspirin 81 MG tablet Take 81 mg by mouth daily.   dapagliflozin propanediol 10 MG Tabs tablet Commonly known as: Farxiga Take 1 tablet (10 mg total) by mouth daily.   Fish Oil 600 MG Caps Take 3,000 mg by mouth daily.   glimepiride 4 MG tablet Commonly known as: AMARYL Take 1 tablet (4 mg total) by mouth daily with breakfast.   glucosamine-chondroitin 500-400 MG tablet Take 1 tablet by mouth 3 (three) times daily.   Janumet 50-1000 MG tablet Generic drug: sitaGLIPtin-metformin Take 1 tablet by mouth 2 (two) times daily with a meal.   lisinopril-hydrochlorothiazide 10-12.5 MG tablet Commonly known as: ZESTORETIC Take 1 tablet by mouth daily. Replaces: lisinopril-hydrochlorothiazide 20-12.5 MG tablet Started by: Fransisca Kaufmann Zoria Rawlinson, MD   meloxicam 15 MG tablet Commonly known as: MOBIC Take 1 tablet (15 mg total) by mouth daily.  multivitamin tablet Take 1 tablet by mouth daily.   omeprazole 40 MG capsule Commonly known as: PRILOSEC Take 1 capsule (40 mg total) by mouth daily.   sildenafil 20 MG tablet Commonly known as: REVATIO TAKE 1 TO 3 TABLETS BY MOUTH AS NEEDED   simvastatin 40 MG tablet Commonly known as: ZOCOR Take 1 tablet (40 mg total) by mouth every evening.         Objective:   BP (!) 96/55   Pulse 64   Temp 98 F (36.7 C)   Ht '5\' 6"'$  (1.676 m)   Wt 180 lb (81.6 kg)   SpO2 98%   BMI 29.05  kg/m   Wt Readings from Last 3 Encounters:  09/01/22 180 lb (81.6 kg)  06/03/22 176 lb 2 oz (79.9 kg)  05/26/22 181 lb (82.1 kg)    Physical Exam Vitals and nursing note reviewed.  Constitutional:      General: He is not in acute distress.    Appearance: He is well-developed. He is not diaphoretic.  Eyes:     General: No scleral icterus.    Conjunctiva/sclera: Conjunctivae normal.  Neck:     Thyroid: No thyromegaly.  Cardiovascular:     Rate and Rhythm: Normal rate and regular rhythm.     Heart sounds: Normal heart sounds. No murmur heard. Pulmonary:     Effort: Pulmonary effort is normal. No respiratory distress.     Breath sounds: Normal breath sounds. No wheezing.  Musculoskeletal:        General: No swelling. Normal range of motion.     Cervical back: Neck supple.  Lymphadenopathy:     Cervical: No cervical adenopathy.  Skin:    General: Skin is warm and dry.     Findings: No rash.  Neurological:     Mental Status: He is alert and oriented to person, place, and time.     Coordination: Coordination normal.  Psychiatric:        Behavior: Behavior normal.       Assessment & Plan:   Problem List Items Addressed This Visit       Cardiovascular and Mediastinum   Hypertension associated with diabetes ([place])   Relevant Medications   lisinopril-hydrochlorothiazide (ZESTORETIC) 10-12.5 MG tablet     Endocrine   T2DM (type 2 diabetes mellitus) ([place]) - Primary   Relevant Medications   lisinopril-hydrochlorothiazide (ZESTORETIC) 10-12.5 MG tablet   Other Relevant Orders   Bayer DCA Hb A1c Waived   Hyperlipidemia associated with type 2 diabetes mellitus (HCC)   Relevant Medications   lisinopril-hydrochlorothiazide (ZESTORETIC) 10-12.5 MG tablet  A1c looks good at 6.0.  Blood pressure on low side so we will lower his blood pressure dose of the lisinopril hydrochlorothiazide.  Sent a lower dose on prescription.  Follow up plan: Return in about 3 months (around  12/01/2022), or if symptoms worsen or fail to improve, for Diabetes hypertension cholesterol.  Counseling provided for all of the vaccine components Orders Placed This Encounter  Procedures   Bayer [place] Hb A1c [place] Sybrina Laning, MD [facility] 09/01/2022, 8:34 AM

## 2022-09-21 ENCOUNTER — Other Ambulatory Visit: Payer: Self-pay | Admitting: Family Medicine

## 2022-10-14 ENCOUNTER — Telehealth: Payer: Self-pay

## 2022-10-14 NOTE — Telephone Encounter (Signed)
error 

## 2022-12-02 ENCOUNTER — Telehealth: Payer: Self-pay | Admitting: Pharmacist

## 2022-12-02 ENCOUNTER — Ambulatory Visit (INDEPENDENT_AMBULATORY_CARE_PROVIDER_SITE_OTHER): Payer: Medicare HMO | Admitting: Family Medicine

## 2022-12-02 ENCOUNTER — Encounter: Payer: Self-pay | Admitting: Family Medicine

## 2022-12-02 VITALS — BP 127/73 | HR 72 | Temp 97.4°F | Ht 66.0 in | Wt 187.0 lb

## 2022-12-02 DIAGNOSIS — E1169 Type 2 diabetes mellitus with other specified complication: Secondary | ICD-10-CM | POA: Diagnosis not present

## 2022-12-02 DIAGNOSIS — Z125 Encounter for screening for malignant neoplasm of prostate: Secondary | ICD-10-CM | POA: Diagnosis not present

## 2022-12-02 DIAGNOSIS — E785 Hyperlipidemia, unspecified: Secondary | ICD-10-CM

## 2022-12-02 DIAGNOSIS — E1159 Type 2 diabetes mellitus with other circulatory complications: Secondary | ICD-10-CM

## 2022-12-02 DIAGNOSIS — I152 Hypertension secondary to endocrine disorders: Secondary | ICD-10-CM | POA: Diagnosis not present

## 2022-12-02 LAB — BAYER DCA HB A1C WAIVED: HB A1C (BAYER DCA - WAIVED): 6.8 % — ABNORMAL HIGH (ref 4.8–5.6)

## 2022-12-02 NOTE — Telephone Encounter (Signed)
Please enroll for patient assistance Janumet 50-'1000mg'$  1 tab by mouth twice daily with a meal Farxiga '10mg'$  daily  Patient aware to check mail  Samples left up front  Thank you, Rosendo Gros!

## 2022-12-02 NOTE — Progress Notes (Signed)
BP 127/73   Pulse 72   Temp (!) 97.4 F (36.3 C)   Ht _0  (1.676 m)   Wt 187 lb (84.8 kg)   SpO2 97%   BMI 30.18 kg/m    Subjective:   Patient ID: Austin Ortiz, male    DOB: 1951/03/30, 71 y.o.   MRN: [id number]  HPI: Austin Ortiz is a 71 y.o. male presenting on 12/02/2022 for Medical Management of Chronic Issues, Hyperlipidemia, Diabetes, and Hypertension   HPI Type 2 diabetes mellitus Patient comes in today for recheck of his diabetes. Patient has been currently taking [place] and glimepiride and Janumet. Patient is currently on an ACE inhibitor/ARB. Patient has seen an ophthalmologist this year. Patient denies any issues with their feet. The symptom started onset as an adult hypertension and hyperlipidemia ARE RELATED TO DM   Hypertension Patient is currently on lisinopril hydrochlorothiazide, and their blood pressure today is 127/73. Patient denies any lightheadedness or dizziness. Patient denies headaches, blurred vision, chest pains, shortness of breath, or weakness. Denies any side effects from medication and is content with current medication.   Hyperlipidemia Patient is coming in for recheck of his hyperlipidemia. The patient is currently taking fish oil and simvastatin. They deny any issues with myalgias or history of liver damage from it. They deny any focal numbness or weakness or chest pain.   Relevant past medical, surgical, family and social history reviewed and updated as indicated. Interim medical history since our last visit reviewed. Allergies and medications reviewed and updated.  Review of Systems  Constitutional:  Negative for chills and fever.  Eyes:  Negative for visual disturbance.  Respiratory:  Negative for shortness of breath and wheezing.   Cardiovascular:  Negative for chest pain and leg swelling.  Musculoskeletal:  Negative for back pain and gait problem.  Skin:  Negative for rash.  Neurological:  Negative for dizziness, weakness and  light-headedness.  All other systems reviewed and are negative.   Per HPI unless specifically indicated above   Allergies as of 12/02/2022       Reactions   Alpha-gal    Cetuximab    Pt denies         Medication List        Accurate as of December 02, 2022  8:41 AM. If you have any questions, ask your nurse or doctor.          aspirin 81 MG tablet Take 81 mg by mouth daily.   dapagliflozin propanediol 10 MG Tabs tablet Commonly known as: Farxiga Take 1 tablet (10 mg total) by mouth daily.   Fish Oil 600 MG Caps Take 3,000 mg by mouth daily.   glimepiride 4 MG tablet Commonly known as: AMARYL Take 1 tablet (4 mg total) by mouth daily with breakfast.   glucosamine-chondroitin 500-400 MG tablet Take 1 tablet by mouth 3 (three) times daily.   Janumet 50-1000 MG tablet Generic drug: sitaGLIPtin-metformin Take 1 tablet by mouth 2 (two) times daily with a meal.   lisinopril-hydrochlorothiazide 10-12.5 MG tablet Commonly known as: ZESTORETIC Take 1 tablet by mouth daily.   meloxicam 15 MG tablet Commonly known as: MOBIC Take 1 tablet (15 mg total) by mouth daily.   multivitamin tablet Take 1 tablet by mouth daily.   omeprazole 40 MG capsule Commonly known as: PRILOSEC Take 1 capsule (40 mg total) by mouth daily.   sildenafil 20 MG tablet Commonly known as: REVATIO TAKE 1 TO 3 TABLETS BY MOUTH AS NEEDED  simvastatin 40 MG tablet Commonly known as: ZOCOR Take 1 tablet (40 mg total) by mouth every evening.         Objective:   BP 127/73   Pulse 72   Temp (!) 97.4 F (36.3 C)   Ht _0  (1.676 m)   Wt 187 lb (84.8 kg)   SpO2 97%   BMI 30.18 kg/m   Wt Readings from Last 3 Encounters:  12/02/22 187 lb (84.8 kg)  09/01/22 180 lb (81.6 kg)  06/03/22 176 lb 2 oz (79.9 kg)    Physical Exam Vitals and nursing note reviewed.  Constitutional:      General: He is not in acute distress.    Appearance: He is well-developed. He is not diaphoretic.   Eyes:     General: No scleral icterus.    Conjunctiva/sclera: Conjunctivae normal.  Neck:     Thyroid: No thyromegaly.  Cardiovascular:     Rate and Rhythm: Normal rate and regular rhythm.     Heart sounds: Normal heart sounds. No murmur heard. Pulmonary:     Effort: Pulmonary effort is normal. No respiratory distress.     Breath sounds: Normal breath sounds. No wheezing.  Musculoskeletal:        General: Normal range of motion.     Cervical back: Neck supple.  Lymphadenopathy:     Cervical: No cervical adenopathy.  Skin:    General: Skin is warm and dry.     Findings: No rash.  Neurological:     Mental Status: He is alert and oriented to person, place, and time.     Coordination: Coordination normal.  Psychiatric:        Behavior: Behavior normal.       Assessment & Plan:   Problem List Items Addressed This Visit       Cardiovascular and Mediastinum   Hypertension associated with diabetes ([place])   Relevant Orders   CBC with Differential/Platelet   CMP14+EGFR   Lipid panel   Bayer DCA Hb A1c Waived   PSA, total and free     Endocrine   T2DM (type 2 diabetes mellitus) ([place]) - Primary   Relevant Orders   CBC with Differential/Platelet   CMP14+EGFR   Lipid panel   Bayer DCA Hb A1c Waived   PSA, total and free   Microalbumin / creatinine urine ratio   Hyperlipidemia associated with type 2 diabetes mellitus (HCC)   Relevant Orders   CBC with Differential/Platelet   CMP14+EGFR   Lipid panel   Bayer DCA Hb A1c Waived   PSA, total and free    A1c slightly up at 6.8, just focus back on diet, especially with the holidays and his birthday it is expected to be.  Blood pressure looks good and everything else looks good, no changes Follow up plan: Return in about 3 months (around 03/03/2023), or if symptoms worsen or fail to improve, for Diabetes hypertension and cholesterol.  Counseling provided for all of the vaccine components Orders Placed This Encounter   Procedures   CBC with Differential/Platelet   CMP14+EGFR   Lipid panel   Bayer DCA Hb A1c Waived   PSA, total and free   Microalbumin / creatinine urine ratio    Caryl Pina, MD [facility] 12/02/2022, 8:41 AM

## 2022-12-03 LAB — CMP14+EGFR
ALT: 32 IU/L (ref 0–44)
AST: 23 IU/L (ref 0–40)
Albumin/Globulin Ratio: 2 (ref 1.2–2.2)
Albumin: 4.5 g/dL (ref 3.9–4.9)
Alkaline Phosphatase: 49 IU/L (ref 44–121)
BUN/Creatinine Ratio: 25 — ABNORMAL HIGH (ref 10–24)
BUN: 22 mg/dL (ref 8–27)
Bilirubin Total: 0.3 mg/dL (ref 0.0–1.2)
CO2: 22 mmol/L (ref 20–29)
Calcium: 9.2 mg/dL (ref 8.6–10.2)
Chloride: 101 mmol/L (ref 96–106)
Creatinine, Ser: 0.87 mg/dL (ref 0.76–1.27)
Globulin, Total: 2.3 g/dL (ref 1.5–4.5)
Glucose: 146 mg/dL — ABNORMAL HIGH (ref 70–99)
Potassium: 4.8 mmol/L (ref 3.5–5.2)
Sodium: 139 mmol/L (ref 134–144)
Total Protein: 6.8 g/dL (ref 6.0–8.5)
eGFR: 93 mL/min/{1.73_m2} (ref >59)

## 2022-12-03 LAB — LIPID PANEL
Chol/HDL Ratio: 3.5 ratio (ref 0.0–5.0)
Cholesterol, Total: 112 mg/dL (ref 100–199)
HDL: 32 mg/dL — ABNORMAL LOW (ref >39)
LDL Chol Calc (NIH): 48 mg/dL (ref 0–99)
Triglycerides: 192 mg/dL — ABNORMAL HIGH (ref 0–149)
VLDL Cholesterol Cal: 32 mg/dL (ref 5–40)

## 2022-12-03 LAB — MICROALBUMIN / CREATININE URINE RATIO
Creatinine, Urine: 84.8 mg/dL
Microalb/Creat Ratio: 13 mg/g creat (ref 0–29)
Microalbumin, Urine: 10.7 ug/mL

## 2022-12-03 LAB — PSA, TOTAL AND FREE
PSA, Free Pct: 58.3 %
PSA, Free: 0.35 ng/mL
Prostate Specific Ag, Serum: 0.6 ng/mL (ref 0.0–4.0)

## 2022-12-03 LAB — CBC WITH DIFFERENTIAL/PLATELET
Basophils Absolute: 0.1 10*3/uL (ref 0.0–0.2)
Basos: 1 %
EOS (ABSOLUTE): 0.2 10*3/uL (ref 0.0–0.4)
Eos: 3 %
Hematocrit: 45.7 % (ref 37.5–51.0)
Hemoglobin: 15.1 g/dL (ref 13.0–17.7)
Immature Grans (Abs): 0 10*3/uL (ref 0.0–0.1)
Immature Granulocytes: 0 %
Lymphocytes Absolute: 2.1 10*3/uL (ref 0.7–3.1)
Lymphs: 28 %
MCH: 30.4 pg (ref 26.6–33.0)
MCHC: 33 g/dL (ref 31.5–35.7)
MCV: 92 fL (ref 79–97)
Monocytes Absolute: 0.8 10*3/uL (ref 0.1–0.9)
Monocytes: 10 %
Neutrophils Absolute: 4.5 10*3/uL (ref 1.4–7.0)
Neutrophils: 58 %
Platelets: 277 10*3/uL (ref 150–450)
RBC: 4.97 x10E6/uL (ref 4.14–5.80)
RDW: 13 % (ref 11.6–15.4)
WBC: 7.7 10*3/uL (ref 3.4–10.8)

## 2022-12-08 ENCOUNTER — Telehealth: Payer: Self-pay | Admitting: Family Medicine

## 2022-12-09 NOTE — Telephone Encounter (Signed)
Patient f/u on mailed application I know it takes awhile, but forwarding if you can f/u

## 2022-12-16 ENCOUNTER — Encounter: Payer: Self-pay | Admitting: Family Medicine

## 2022-12-16 NOTE — Telephone Encounter (Signed)
Attempted to call pt to f/u on application. Will try again next week.

## 2022-12-23 ENCOUNTER — Telehealth (INDEPENDENT_AMBULATORY_CARE_PROVIDER_SITE_OTHER): Payer: Medicare HMO | Admitting: Nurse Practitioner

## 2022-12-23 ENCOUNTER — Encounter: Payer: Self-pay | Admitting: Nurse Practitioner

## 2022-12-23 ENCOUNTER — Telehealth: Payer: Self-pay | Admitting: Nurse Practitioner

## 2022-12-23 ENCOUNTER — Other Ambulatory Visit: Payer: Self-pay | Admitting: Nurse Practitioner

## 2022-12-23 DIAGNOSIS — U071 COVID-19: Secondary | ICD-10-CM | POA: Diagnosis not present

## 2022-12-23 DIAGNOSIS — R051 Acute cough: Secondary | ICD-10-CM

## 2022-12-23 MED ORDER — MOLNUPIRAVIR EUA 200MG CAPSULE: 4.0000 | ORAL_CAPSULE | Freq: Two times a day (BID) | ORAL | 0 refills | Status: DC

## 2022-12-23 MED ORDER — MOLNUPIRAVIR EUA 200MG CAPSULE: 4.0000 | ORAL_CAPSULE | Freq: Two times a day (BID) | ORAL | 0 refills | Status: AC

## 2022-12-23 MED ORDER — GUAIFENESIN ER 600 MG PO TB12: 600.0000 mg | ORAL_TABLET | Freq: Two times a day (BID) | ORAL | 0 refills | Status: DC

## 2022-12-23 NOTE — Telephone Encounter (Signed)
Patient has no GFR documented so Paxlovid can not be sent, [facility]

## 2022-12-23 NOTE — Progress Notes (Signed)
   Virtual Visit  Note Due to COVID-19 pandemic this visit was conducted virtually. This visit type was conducted due to national recommendations for restrictions regarding the COVID-19 Pandemic (e.g. social distancing, sheltering in place) in an effort to limit this patient's exposure and mitigate transmission in our community. All issues noted in this document were discussed and addressed.  A physical exam was not performed with this format.  I connected with Austin Ortiz on 12/23/22 at 9:40 am by telephone and verified that I am speaking with the correct person using two identifiers. Austin Ortiz is currently located at home during visit. The provider, Ivy Lynn, NP is located in their office at time of visit.  I discussed the limitations, risks, security and privacy concerns of performing an evaluation and management service by telephone and the availability of in person appointments. I also discussed with the patient that there may be a patient responsible charge related to this service. The patient expressed understanding and agreed to proceed.   History and Present Illness:  URI  This is a new problem. Episode onset: in the past 3-4 days. The problem has been gradually worsening. There has been no fever. Associated symptoms include congestion, coughing and sinus pain. Pertinent negatives include no rash or sore throat. He has tried acetaminophen (OTC cold medication) for the symptoms. The treatment provided no relief.  Cough This is a new problem. The current episode started in the past 7 days. The problem has been unchanged. The problem occurs constantly. The cough is Productive of sputum. Associated symptoms include chills. Pertinent negatives include no rash or sore throat. Nothing aggravates the symptoms. He has tried OTC cough suppressant for the symptoms. The treatment provided no relief.      Review of Systems  Constitutional:  Positive for chills and malaise/fatigue.   HENT:  Positive for congestion and sinus pain. Negative for sore throat.   Respiratory:  Positive for cough.   Skin: Negative.  Negative for itching and rash.  All other systems reviewed and are negative.    Observations/Objective: Televisit patient not in distress  Assessment and Plan: Patient presents with symptoms of upper respiratory infection.  At home COVID-19 test positive Education provided to patient and COVID-19. Started patient on molnupiravir, patient knows to hold statin sildenafil while on medication. Take meds as prescribed - Use a cool mist humidifier  -Use saline nose sprays frequently -Force fluids -For fever or aches or pains- take Tylenol or ibuprofen.   Follow Up Instructions: Follow-up with worsening or unresolved symptoms.    I discussed the assessment and treatment plan with the patient. The patient was provided an opportunity to ask questions and all were answered. The patient agreed with the plan and demonstrated an understanding of the instructions.   The patient was advised to call back or seek an in-person evaluation if the symptoms worsen or if the condition fails to improve as anticipated.  The above assessment and management plan was discussed with the patient. The patient verbalized understanding of and has agreed to the management plan. Patient is aware to call the clinic if symptoms persist or worsen. Patient is aware when to return to the clinic for a follow-up visit. Patient educated on when it is appropriate to go to the emergency department.   Time call ended:  9:50 am  I provided 10 minutes of  non face-to-face time during this encounter.    Ivy Lynn, NP

## 2022-12-23 NOTE — Telephone Encounter (Signed)
[facility] in [place] does not have generic MOLNUPIRAVIR in stock but [facility] [place] [facility] want to [facility] there. [facility] only has the name brand of the [facility] in stock but its not covered by patients insurance. [facility] also has the [place] in stock if provider wants to change Rx and resend.

## 2022-12-27 NOTE — Telephone Encounter (Signed)
Pt had already [facility] and is feeling much better.

## 2023-02-03 NOTE — Telephone Encounter (Signed)
Received notification from [employer] regarding approval for [facility]. Patient assistance approved from 01/27/23 to 12/26/23.

## 2023-02-13 ENCOUNTER — Telehealth: Payer: Self-pay | Admitting: Family Medicine

## 2023-02-13 NOTE — Telephone Encounter (Signed)
Left message making spouse aware that we do not have any samples at this time. Advised to call later in the week to see if a drug rep has come by to drop of samples.

## 2023-02-21 ENCOUNTER — Telehealth: Payer: Self-pay | Admitting: Family Medicine

## 2023-02-21 DIAGNOSIS — E1169 Type 2 diabetes mellitus with other specified complication: Secondary | ICD-10-CM

## 2023-02-21 IMAGING — CR DG NECK SOFT TISSUE
1 series · 1 of 1 positions shown · non-contrast
Comparison: Chest radiograph May 12, 2021

CLINICAL DATA: Hypoglossal nerve stimulator placement

EXAM:
NECK SOFT TISSUES - 1+ VIEW

[neck lat]
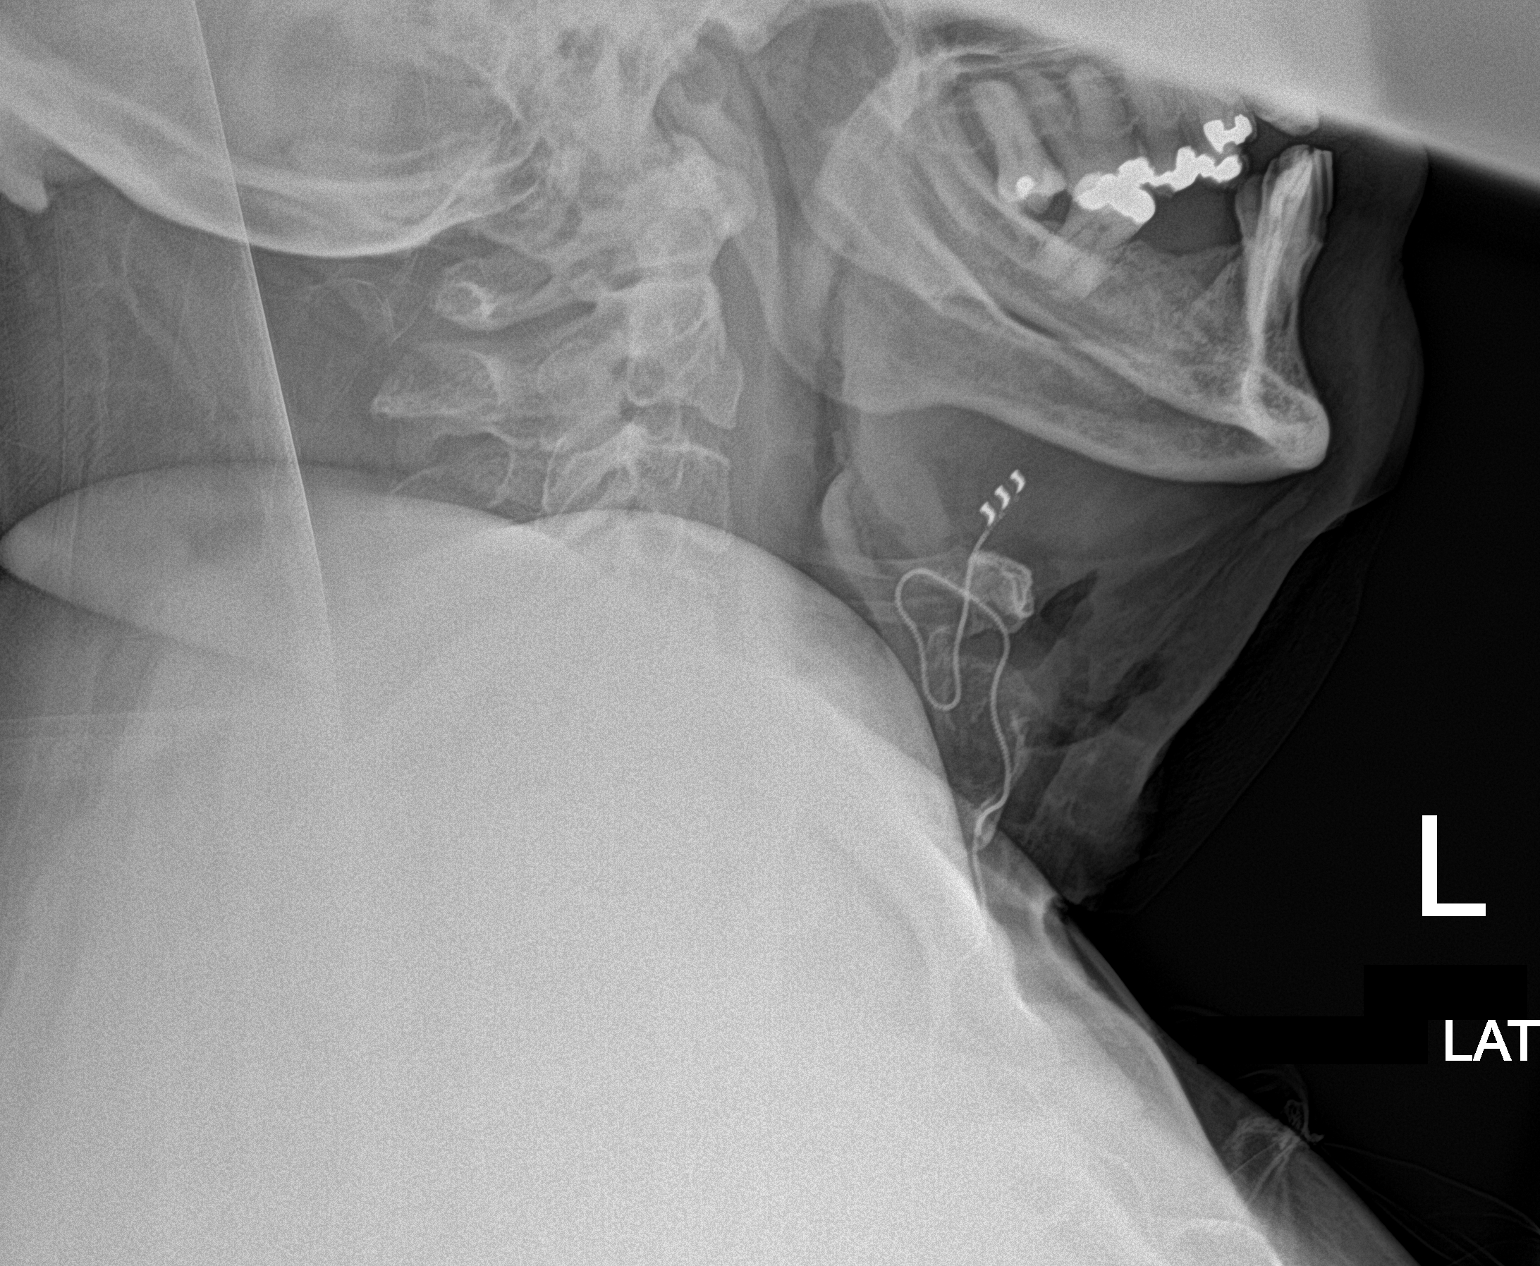

[1 of 1 positions shown; findings below may reference images not displayed]

FINDINGS: Lateral view obtained. Hypoglossal stimulator device located along
the inferior aspect of the tongue at the C2-3 level. Soft tissue air
in this area is likely of postoperative etiology. The epiglottis and
aryepiglottic fold regions appear normal. Visualized trachea appears
unremarkable. Tongue base region appears normal. Prevertebral soft
tissues are normal. Visualized bones intact.
IMPRESSION: Hypoglossal stimulator at the tongue base region at the level of
C2-3. Postoperative changes noted in the soft tissues. Epiglottis
and aryepiglottic folds appear normal. Prevertebral soft tissues
normal.

## 2023-02-21 IMAGING — CR DG CHEST 1V
1 series · 1 of 1 positions shown · non-contrast
Comparison: None.

CLINICAL DATA: Hypoglossal nerve stimulator placement

EXAM:
CHEST  1 VIEW

[chest ap]
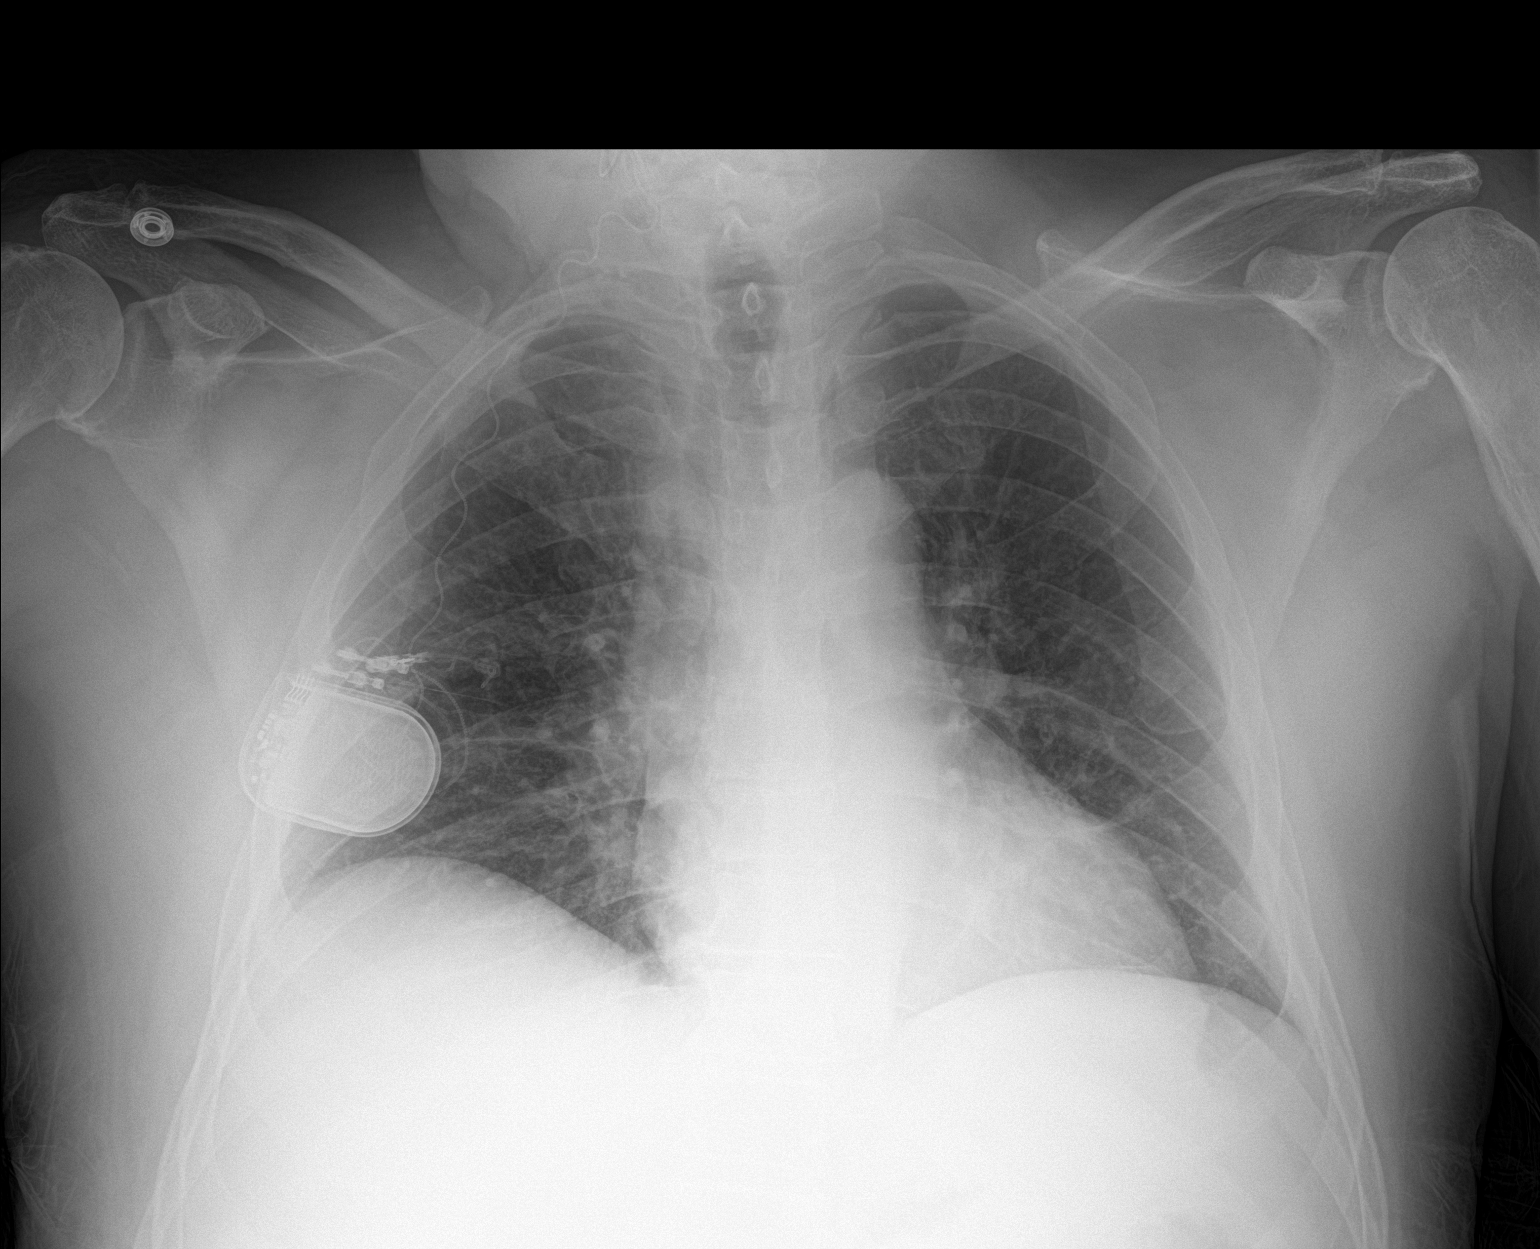

[1 of 1 positions shown; findings below may reference images not displayed]

FINDINGS: A portion of the right lung is obscured by a right hypoglossal nerve
stimulator device. Visualized lungs are clear. Heart size and
pulmonary vascularity are normal. No adenopathy. Tip of hypoglossal
stimulator device in the right medial upper cervical region.
IMPRESSION: Apparent hypoglossal stimulator on the right precluding
visualization of a portion of the right lower lobe. Visualized lungs
clear. Heart size normal.

## 2023-02-21 MED ORDER — JANUMET 50-1000 MG PO TABS: 1.0000 | ORAL_TABLET | Freq: Two times a day (BID) | ORAL | 1 refills | Status: DC

## 2023-02-21 NOTE — Telephone Encounter (Signed)
Informed that we do not have samples of Janumet. Wife states that they are applying for pt assistance. Applied about one week ago. 30d supply sent to local pharmacy to help with cost.

## 2023-03-03 ENCOUNTER — Ambulatory Visit (INDEPENDENT_AMBULATORY_CARE_PROVIDER_SITE_OTHER): Payer: Medicare HMO | Admitting: Family Medicine

## 2023-03-03 ENCOUNTER — Encounter: Payer: Self-pay | Admitting: Family Medicine

## 2023-03-03 VITALS — BP 122/74 | HR 69 | Ht 66.0 in | Wt 187.0 lb

## 2023-03-03 DIAGNOSIS — E785 Hyperlipidemia, unspecified: Secondary | ICD-10-CM | POA: Diagnosis not present

## 2023-03-03 DIAGNOSIS — E1159 Type 2 diabetes mellitus with other circulatory complications: Secondary | ICD-10-CM | POA: Diagnosis not present

## 2023-03-03 DIAGNOSIS — K2100 Gastro-esophageal reflux disease with esophagitis, without bleeding: Secondary | ICD-10-CM

## 2023-03-03 DIAGNOSIS — I152 Hypertension secondary to endocrine disorders: Secondary | ICD-10-CM | POA: Diagnosis not present

## 2023-03-03 DIAGNOSIS — E1169 Type 2 diabetes mellitus with other specified complication: Secondary | ICD-10-CM | POA: Diagnosis not present

## 2023-03-03 LAB — BAYER DCA HB A1C WAIVED: HB A1C (BAYER DCA - WAIVED): 7.3 % — ABNORMAL HIGH (ref 4.8–5.6)

## 2023-03-03 MED ORDER — OMEPRAZOLE 40 MG PO CPDR: 40.0000 mg | DELAYED_RELEASE_CAPSULE | Freq: Every day | ORAL | 3 refills | Status: DC

## 2023-03-03 NOTE — Progress Notes (Signed)
BP 122/74   Pulse 69   Ht '5\' 6"'$  (1.676 m)   Wt 187 lb (84.8 kg)   SpO2 98%   BMI 30.18 kg/m    Subjective:   Patient ID: Austin Ortiz, male    DOB: 04-May-1951, 72 y.o.   MRN: [id number]  HPI: Austin Ortiz is a 72 y.o. male presenting on 03/03/2023 for Medical Management of Chronic Issues and Diabetes   HPI Type 2 diabetes mellitus Patient comes in today for recheck of his diabetes. Patient has been currently taking [place] and glimepiride and Janumet, A1c is up slightly at 7.3. Patient is currently on an ACE inhibitor/ARB. Patient has not seen an ophthalmologist this year. Patient denies any issues with their feet. The symptom started onset as an adult hypertension and hyperlipidemia ARE RELATED TO DM   Hypertension Patient is currently on lisinopril hydrochlorothiazide, and their blood pressure today is 122/74. Patient denies any lightheadedness or dizziness. Patient denies headaches, blurred vision, chest pains, shortness of breath, or weakness. Denies any side effects from medication and is content with current medication.   Hyperlipidemia Patient is coming in for recheck of his hyperlipidemia. The patient is currently taking simvastatin. They deny any issues with myalgias or history of liver damage from it. They deny any focal numbness or weakness or chest pain.   GERD Patient is currently on omeprazole.  She denies any major symptoms or abdominal pain or belching or burping. She denies any blood in her stool or lightheadedness or dizziness.   Relevant past medical, surgical, family and social history reviewed and updated as indicated. Interim medical history since our last visit reviewed. Allergies and medications reviewed and updated.  Review of Systems  Constitutional:  Negative for chills and fever.  Eyes:  Negative for visual disturbance.  Respiratory:  Negative for shortness of breath and wheezing.   Cardiovascular:  Negative for chest pain and leg swelling.   Musculoskeletal:  Negative for back pain and gait problem.  Skin:  Negative for rash.  Neurological:  Negative for dizziness, weakness and light-headedness.  All other systems reviewed and are negative.   Per HPI unless specifically indicated above   Allergies as of 03/03/2023       Reactions   Alpha-gal    Cetuximab    Pt denies         Medication List        Accurate as of March 03, 2023  9:02 AM. If you have any questions, ask your nurse or doctor.          aspirin 81 MG tablet Take 81 mg by mouth daily.   dapagliflozin propanediol 10 MG Tabs tablet Commonly known as: Farxiga Take 1 tablet (10 mg total) by mouth daily.   Fish Oil 600 MG Caps Take 3,000 mg by mouth daily.   glimepiride 4 MG tablet Commonly known as: AMARYL Take 1 tablet (4 mg total) by mouth daily with breakfast.   glucosamine-chondroitin 500-400 MG tablet Take 1 tablet by mouth 3 (three) times daily.   guaiFENesin 600 MG 12 hr tablet Commonly known as: Mucinex Take 1 tablet (600 mg total) by mouth 2 (two) times daily.   Janumet 50-1000 MG tablet Generic drug: sitaGLIPtin-metformin Take 1 tablet by mouth 2 (two) times daily with a meal.   lisinopril-hydrochlorothiazide 10-12.5 MG tablet Commonly known as: ZESTORETIC Take 1 tablet by mouth daily.   meloxicam 15 MG tablet Commonly known as: MOBIC Take 1 tablet (15 mg total) by  mouth daily.   multivitamin tablet Take 1 tablet by mouth daily.   omeprazole 40 MG capsule Commonly known as: PRILOSEC Take 1 capsule (40 mg total) by mouth daily.   sildenafil 20 MG tablet Commonly known as: REVATIO TAKE 1 TO 3 TABLETS BY MOUTH AS NEEDED   simvastatin 40 MG tablet Commonly known as: ZOCOR Take 1 tablet (40 mg total) by mouth every evening.         Objective:   BP 122/74   Pulse 69   Ht '5\' 6"'$  (1.676 m)   Wt 187 lb (84.8 kg)   SpO2 98%   BMI 30.18 kg/m   Wt Readings from Last 3 Encounters:  03/03/23 187 lb (84.8 kg)   12/02/22 187 lb (84.8 kg)  09/01/22 180 lb (81.6 kg)    Physical Exam Vitals and nursing note reviewed.  Constitutional:      General: He is not in acute distress.    Appearance: He is well-developed. He is not diaphoretic.  Eyes:     General: No scleral icterus.    Conjunctiva/sclera: Conjunctivae normal.  Neck:     Thyroid: No thyromegaly.  Cardiovascular:     Rate and Rhythm: Normal rate and regular rhythm.     Heart sounds: Normal heart sounds. No murmur heard. Pulmonary:     Effort: Pulmonary effort is normal. No respiratory distress.     Breath sounds: Normal breath sounds. No wheezing.  Musculoskeletal:     Cervical back: Neck supple.  Lymphadenopathy:     Cervical: No cervical adenopathy.  Skin:    General: Skin is warm and dry.     Findings: No rash.  Neurological:     Mental Status: He is alert and oriented to person, place, and time.     Coordination: Coordination normal.  Psychiatric:        Behavior: Behavior normal.     Assessment & Plan:   Problem List Items Addressed This Visit       Cardiovascular and Mediastinum   Hypertension associated with diabetes ([place])     Digestive   GERD (gastroesophageal reflux disease)   Relevant Medications   omeprazole (PRILOSEC) 40 MG capsule     Endocrine   T2DM (type 2 diabetes mellitus) ([place]) - Primary   Relevant Orders   Bayer DCA Hb A1c Waived   Hyperlipidemia associated with type 2 diabetes mellitus ([facility])  A1c is up slightly at 7.3, focus on diet.  Not going to change his medicines today. Blood pressure looks good and everything else looks good so no other changes.  Follow up plan: Return in about 3 months (around 06/03/2023), or if symptoms worsen or fail to improve, for Diabetes.  Counseling provided for all of the vaccine components Orders Placed This Encounter  Procedures   Bayer [place] Hb A1c [place] Rommel Hogston, MD [facility] 03/03/2023, 9:02 AM

## 2023-03-08 ENCOUNTER — Ambulatory Visit (INDEPENDENT_AMBULATORY_CARE_PROVIDER_SITE_OTHER): Payer: Medicare HMO

## 2023-03-08 VITALS — Ht 66.0 in | Wt 187.0 lb

## 2023-03-08 DIAGNOSIS — Z0000 Encounter for general adult medical examination without abnormal findings: Secondary | ICD-10-CM | POA: Diagnosis not present

## 2023-03-08 NOTE — Progress Notes (Signed)
Subjective:   Austin Ortiz is a 72 y.o. male who presents for Medicare Annual/Subsequent preventive examination.  I connected with  Austin Ortiz on 03/08/23 by a audio enabled telemedicine application and verified that I am speaking with the correct person using two identifiers.  Patient Location: Home  Provider Location: Home Office  I discussed the limitations of evaluation and management by telemedicine. The patient expressed understanding and agreed to proceed.  Review of Systems     Cardiac Risk Factors include: advanced age (>60mn, >>24women);diabetes mellitus;dyslipidemia;male gender;hypertension     Objective:    Today's Vitals   03/08/23 1909  Weight: 187 lb (84.8 kg)  Height: '5\' 6"'$  (1.676 m)   Body mass index is 30.18 kg/m.     03/08/2023    7:12 PM 03/04/2022    3:52 PM 09/16/2021    8:14 PM 05/12/2021    8:22 AM 05/03/2021    3:41 PM 03/03/2021    4:12 PM 07/05/2019    8:38 AM  Advanced Directives  Does Patient Have a Medical Advance Directive? Yes No No No No No No  Type of Advance Directive Living will;Healthcare Power of Attorney        Does patient want to make changes to medical advance directive? No - Patient declined        Copy of [place]? No - copy requested    No - copy requested    Would patient like information on creating a medical advance directive?  No - Patient declined Yes (MAU/Ambulatory/Procedural Areas - Information given) No - Patient declined  No - Patient declined No - Patient declined    Current Medications (verified) Outpatient Encounter Medications as of 03/08/2023  Medication Sig   aspirin 81 MG tablet Take 81 mg by mouth daily.   dapagliflozin propanediol (FARXIGA) 10 MG TABS tablet Take 1 tablet (10 mg total) by mouth daily.   glimepiride (AMARYL) 4 MG tablet Take 1 tablet (4 mg total) by mouth daily with breakfast.   glucosamine-chondroitin 500-400 MG tablet Take 1 tablet by mouth 3 (three) times  daily.   guaiFENesin (MUCINEX) 600 MG 12 hr tablet Take 1 tablet (600 mg total) by mouth 2 (two) times daily.   lisinopril-hydrochlorothiazide (ZESTORETIC) 10-12.5 MG tablet Take 1 tablet by mouth daily.   meloxicam (MOBIC) 15 MG tablet Take 1 tablet (15 mg total) by mouth daily.   Multiple Vitamin (MULTIVITAMIN) tablet Take 1 tablet by mouth daily.   Omega-3 Fatty Acids (FISH OIL) 600 MG CAPS Take 3,000 mg by mouth daily.   omeprazole (PRILOSEC) 40 MG capsule Take 1 capsule (40 mg total) by mouth daily.   sildenafil (REVATIO) 20 MG tablet TAKE 1 TO 3 TABLETS BY MOUTH AS NEEDED   simvastatin (ZOCOR) 40 MG tablet Take 1 tablet (40 mg total) by mouth every evening.   sitaGLIPtin-metformin (JANUMET) 50-1000 MG tablet Take 1 tablet by mouth 2 (two) times daily with a meal.   No facility-administered encounter medications on file as of 03/08/2023.    Allergies (verified) Alpha-gal and Cetuximab   History: Past Medical History:  Diagnosis Date   Diabetes mellitus    Esophageal stricture    Foreign body in subcutaneous tissue    metal off of a machine, epigastric abd. area   GERD (gastroesophageal reflux disease)    Hyperlipidemia    Hypertension    Kidney stones    Obstructive sleep apnea 05/28/2013   NO cpap use per  pt   Past Surgical History:  Procedure Laterality Date   COLONOSCOPY  10/14/2010   [facility]   DRUG INDUCED ENDOSCOPY N/A 03/31/2021   Procedure: DRUG INDUCED ENDOSCOPY;  Surgeon: Melida Quitter, MD;  Location: [place];  Service: ENT;  Laterality: N/A;   ESOPHAGUS SURGERY  2010   had esophagus stretched   IMPLANTATION OF HYPOGLOSSAL NERVE STIMULATOR Right 05/12/2021   Procedure: IMPLANTATION OF HYPOGLOSSAL NERVE STIMULATOR;  Surgeon: Melida Quitter, MD;  Location: [place];  Service: ENT;  Laterality: Right;   [place]   neg hx     UPPER GASTROINTESTINAL ENDOSCOPY  04/22/2013   [facility]   Family History  Problem Relation  Age of Onset   Heart disease Mother    Hypertension Mother    Diabetes type II Paternal Aunt        x2   Nephrolithiasis Maternal Aunt    Breast cancer Maternal Aunt    Heart attack Father        x 2   Diabetes Maternal Aunt    Diabetes Cousin        mat. side   Prostate cancer Brother    Colon cancer Neg Hx    Colon polyps Neg Hx    Esophageal cancer Neg Hx    Rectal cancer Neg Hx    Stomach cancer Neg Hx    Social History   Socioeconomic History   Marital status: Married    Spouse name: [name]   Number of children: 3   Years of education: 12   Highest education level: High school graduate  Occupational History   Occupation: self employed- farmer  Tobacco Use   Smoking status: Never   Smokeless tobacco: Never  Vaping Use   Vaping Use: Never used  Substance and Sexual Activity   Alcohol use: No   Drug use: No   Sexual activity: Yes  Other Topics Concern   Not on file  Social History Narrative   Not on file   Social Determinants of Health   Financial Resource Strain: Low Risk  (03/08/2023)   Overall Financial Resource Strain (CARDIA)    Difficulty of Paying Living Expenses: Not hard at all  Food Insecurity: No Food Insecurity (03/08/2023)   Hunger Vital Sign    Worried About Running Out of Food in the Last Year: Never true    Ran Out of Food in the Last Year: Never true  Transportation Needs: No Transportation Needs (03/08/2023)   PRAPARE - Hydrologist (Medical): No    Lack of Transportation (Non-Medical): No  Physical Activity: Sufficiently Active (03/08/2023)   Exercise Vital Sign    Days of Exercise per Week: 5 days    Minutes of Exercise per Session: 30 min  Stress: No Stress Concern Present (03/08/2023)   [place]    Feeling of Stress : Not at all  Social Connections: [place] (03/08/2023)   Social Connection and Isolation Panel [NHANES]     Frequency of Communication with Friends and Family: More than three times a week    Frequency of Social Gatherings with Friends and Family: More than three times a week    Attends Religious Services: More than 4 times per year    Active Member of [employer] or Organizations: Yes    Attends Music therapist: More than 4 times per year    Marital Status: Married  Tobacco Counseling Counseling given: Not Answered   Clinical Intake:  Pre-visit preparation completed: Yes  Pain : No/denies pain  Diabetes: Yes CBG done?: No Did pt. bring in CBG monitor from home?: No  How often do you need to have someone help you when you read instructions, pamphlets, or other written materials from your doctor or pharmacy?: 1 - Never  Diabetic?Yes   Nutrition Risk Assessment:  Has the patient had any N/V/D within the last 2 months?  No  Does the patient have any non-healing wounds?  No  Has the patient had any unintentional weight loss or weight gain?  No   Diabetes:  Is the patient diabetic?  Yes  If diabetic, was a CBG obtained today?  No  Did the patient bring in their glucometer from home?  No  How often do you monitor your CBG's? Daily .   Financial Strains and Diabetes Management:  Are you having any financial strains with the device, your supplies or your medication? No .  Does the patient want to be seen by Chronic Care Management for management of their diabetes?  No  Would the patient like to be referred to a Nutritionist or for Diabetic Management?  No   Diabetic Exams:  Diabetic Eye Exam: Completed 04/04/22 Diabetic Foot Exam: Completed 05/26/22   Interpreter Needed?: No  Information entered by :: Denman George LPN   Activities of Daily Living    03/08/2023    7:13 PM  In your present state of health, do you have any difficulty performing the following activities:  Hearing? 0  Vision? 0  Difficulty concentrating or making decisions? 0  Walking or climbing  stairs? 0  Dressing or bathing? 0  Doing errands, shopping? 0  Preparing Food and eating ? N  Using the Toilet? N  In the past six months, have you accidently leaked urine? N  Do you have problems with loss of bowel control? N  Managing your Medications? N  Managing your Finances? N  Housekeeping or managing your Housekeeping? N    Patient Care Team: Dettinger, Fransisca Kaufmann, MD as PCP - General (Family Medicine) Lavera Guise, [facility] as Pharmacist (Family Medicine)  Indicate any recent Medical Services you may have received from other than [facility] providers in the past year (date may be approximate).     Assessment:   This is a routine wellness examination for Austin Ortiz.  Hearing/Vision screen Hearing Screening - Comments:: Denies hearing difficulties  Vision Screening - Comments:: Wears rx glasses - up to date with routine eye exams with [facility]     Dietary issues and exercise activities discussed: Current Exercise Habits: Home exercise routine, Type of exercise: walking, Time (Minutes): 30, Frequency (Times/Week): 5, Weekly Exercise (Minutes/Week): 150, Intensity: Mild   Goals Addressed               This Visit's Progress     COMPLETED: Patient Stated        03/03/2021 AWV Goal: Exercise for General Health  Patient will verbalize understanding of the benefits of increased physical activity: Exercising regularly is important. It will improve your overall fitness, flexibility, and endurance. Regular exercise also will improve your overall health. It can help you control your weight, reduce stress, and improve your bone density. Over the next year, patient will increase physical activity as tolerated with a goal of at least 150 minutes of moderate physical activity per week.  You can tell that you are exercising at a moderate intensity  if your heart starts beating faster and you start breathing faster but can still hold a conversation. Moderate-intensity exercise ideas  include: Walking 1 mile (1.6 km) in about 15 minutes Biking Hiking Golfing Dancing Water aerobics Patient will verbalize understanding of everyday activities that increase physical activity by providing examples like the following: Yard work, such as: Sales promotion account executive Gardening Washing windows or floors Patient will be able to explain general safety guidelines for exercising:  Before you start a new exercise program, talk with your health care provider. Do not exercise so much that you hurt yourself, feel dizzy, or get very short of breath. Wear comfortable clothes and wear shoes with good support. Drink plenty of water while you exercise to prevent dehydration or heat stroke. Work out until your breathing and your heartbeat get faster.       Remain active and independent        COMPLETED: T2DM PHARMD GOAL (pt-stated)        Current Barriers:  Unable to independently afford treatment regimen Unable to maintain control of T2DM Suboptimal therapeutic regimen for T2DM  Pharmacist Clinical Goal(s):  patient will verbalize ability to afford treatment regimen achieve improvement in T2DM as evidenced by T2DM through collaboration with PharmD and provider.    Interventions: 1:1 collaboration with Dettinger, Fransisca Kaufmann, MD regarding development and update of comprehensive plan of care as evidenced by provider attestation and co-signature Inter-disciplinary care team collaboration (see longitudinal plan of care) Comprehensive medication review performed; medication list updated in electronic medical record  Diabetes: New goal. Uncontrolled; current treatment: FARXIGA, JANUMET, GLIMEPIRIDE (d/c);  Current glucose readings: fasting glucose: as low as 90, post prandial glucose: <200 Denies hypoglycemic/hyperglycemic symptoms Current meal patterns:  Discussed meal planning options and Plate method for  healthy eating Avoid sugary drinks and desserts Incorporate balanced protein, non starchy veggies, 1 serving of carbohydrate with each meal Increase water intake Increase physical activity as able Patient is following the "eat to live"  Current exercise: n/a Recommended blood sugar now at goal with lifestyle/diet changes; will continue current regimen but d/c glimepiride; consider combining janumet and farxiga into trijardy or d/c janumet and convert to GLP1/metformin combo  Assessed patient finances. Apply to [place] patient assistance  Lipid Panel--LDL at goal, continue current regimen     Component Value Date/Time   CHOL 131 08/11/2021 0920   TRIG 250 (H) 08/11/2021 0920   TRIG 140 09/15/2014 1529   HDL 32 (L) 08/11/2021 0920   HDL 39 (L) 09/15/2014 1529   CHOLHDL 4.1 08/11/2021 0920   LDLCALC 59 08/11/2021 0920   LDLCALC 63 09/15/2014 1529   LABVLDL 40 08/11/2021 0920    Patient Goals/Self-Care Activities patient will:  - take medications as prescribed as evidenced by patient report and record review check glucose daily, document, and provide at future appointments collaborate with provider on medication access solutions engage in dietary modifications by eat to live method       Depression Screen    03/08/2023    7:11 PM 12/02/2022    8:19 AM 09/01/2022    8:23 AM 06/03/2022    9:48 AM 05/26/2022    8:40 AM 03/04/2022    3:41 PM 02/17/2022    8:26 AM  PHQ 2/9 Scores  PHQ - 2 Score 0 0 0 0 0 0 0  PHQ- 9 Score  0  0 0 0     Fall Risk  03/08/2023    7:10 PM 12/02/2022    8:19 AM 09/01/2022    8:23 AM 06/03/2022    9:48 AM 05/26/2022    8:38 AM  Fall Risk   Falls in the past year? 0 0 0 0 0  Number falls in past yr: 0      Injury with Fall? 0      Risk for fall due to : No Fall Risks      Follow up Falls prevention discussed;Education provided;Falls evaluation completed        FALL RISK PREVENTION PERTAINING TO THE HOME:  Any stairs in or around the home? Yes  If so,  are there any without handrails? No  Home free of loose throw rugs in walkways, pet beds, electrical cords, etc? Yes  Adequate lighting in your home to reduce risk of falls? Yes   ASSISTIVE DEVICES UTILIZED TO PREVENT FALLS:  Life alert? No  Use of a cane, walker or w/c? No  Grab bars in the bathroom? Yes  Shower chair or bench in shower? No  Elevated toilet seat or a handicapped toilet? Yes   TIMED UP AND GO:  Was the test performed? No . Telephonic visit   Cognitive Function:    07/03/2018   11:05 AM  MMSE - Mini Mental State Exam  Orientation to time 5  Orientation to Place 4  Registration 3  Attention/ Calculation 4  Recall 3  Language- name 2 objects 2  Language- repeat 1  Language- follow 3 step command 3  Language- read & follow direction 1  Write a sentence 1  Copy design 1  Total score 28        03/08/2023    7:13 PM 03/03/2021    4:13 PM 07/05/2019    8:40 AM  6CIT Screen  What Year? 0 points 0 points 0 points  What month? 0 points 0 points 0 points  What time? 0 points 0 points 0 points  Count back from 20 0 points 0 points 0 points  Months in reverse 0 points 0 points 0 points  Repeat phrase 0 points 0 points 2 points  Total Score 0 points 0 points 2 points    Immunizations Immunization History  Administered Date(s) Administered   Fluad Quad(high Dose 65+) 10/21/2019   Influenza, High Dose Seasonal PF 11/27/2017   Influenza,inj,Quad PF,6+ Mos 09/25/2013, 10/01/2016   Influenza-Unspecified 09/26/2015, 11/30/2018, 10/06/2020, 09/27/2021   Moderna Sars-Covid-2 Vaccination 01/16/2020, 02/21/2020, 10/31/2020   Pneumococcal Conjugate-13 12/13/2013   Pneumococcal Polysaccharide-23 07/03/2018   Tdap 03/29/2012, 05/26/2022   Zoster Recombinat (Shingrix) 08/11/2021, 02/17/2022   Zoster, Live 07/15/2015    TDAP status: Up to date  Flu Vaccine status: Declined, Education has been provided regarding the importance of this vaccine but patient still declined.  Advised may receive this vaccine at local pharmacy or Health Dept. Aware to provide a copy of the vaccination record if obtained from local pharmacy or Health Dept. Verbalized acceptance and understanding.  Pneumococcal vaccine status: Up to date  Covid-19 vaccine status: Information provided on how to obtain vaccines.   Qualifies for Shingles Vaccine? Yes   Zostavax completed No   Shingrix Completed?: Yes  Screening Tests Health Maintenance  Topic Date Due   COVID-19 Vaccine (4 - 2023-24 season) 08/26/2022   INFLUENZA VACCINE  03/26/2023 (Originally 07/26/2022)   OPHTHALMOLOGY EXAM  04/05/2023   FOOT EXAM  05/27/2023   HEMOGLOBIN A1C  09/03/2023   Diabetic kidney evaluation - eGFR measurement  12/03/2023   Diabetic kidney evaluation - Urine ACR  12/03/2023   COLONOSCOPY (Pts 45-17yr Insurance coverage will need to be confirmed)  01/23/2024   Medicare Annual Wellness (AWV)  03/07/2024   DTaP/Tdap/Td (3 - Td or Tdap) 05/26/2032   Pneumonia Vaccine 72 Years old  Completed   Hepatitis C Screening  Completed   Zoster Vaccines- Shingrix  Completed   HPV VACCINES  Aged Out   COLON CANCER SCREENING ANNUAL FOBT  Discontinued    Health Maintenance  Health Maintenance Due  Topic Date Due   COVID-19 Vaccine (4 - 2023-24 season) 08/26/2022    Colorectal cancer screening: Type of screening: Colonoscopy. Completed 01/22/21. Repeat every 3 years  Lung Cancer Screening: (Low Dose CT Chest recommended if Age 71-80years, 30 pack-year currently smoking OR have quit w/in 15years.) does not qualify.   Lung Cancer Screening Referral: n/a  Additional Screening:  Hepatitis C Screening: does qualify; Completed 01/30/20  Vision Screening: Recommended annual ophthalmology exams for early detection of glaucoma and other disorders of the eye. Is the patient up to date with their annual eye exam?  Yes  Who is the provider or what is the name of the office in which the patient attends annual eye  exams? [facility] Dr. MDebe Coder If pt is not established with a provider, would they like to be referred to a provider to establish care? No .   Dental Screening: Recommended annual dental exams for proper oral hygiene  Community Resource Referral / Chronic Care Management: CRR required this visit?  No   CCM required this visit?  No      Plan:     I have personally reviewed and noted the following in the patient's chart:   Medical and social history Use of alcohol, tobacco or illicit drugs  Current medications and supplements including opioid prescriptions. Patient is not currently taking opioid prescriptions. Functional ability and status Nutritional status Physical activity Advanced directives List of other physicians Hospitalizations, surgeries, and ER visits in previous 12 months Vitals Screenings to include cognitive, depression, and falls Referrals and appointments  In addition, I have reviewed and discussed with patient certain preventive protocols, quality metrics, and best practice recommendations. A written personalized care plan for preventive services as well as general preventive health recommendations were provided to patient.     SVanetta Mulders [place]  [id number]  Due to this being a virtual visit, the after visit summary with patients personalized plan was offered to patient via mail or my-chart. Patient would like to access on my-chart  Nurse Notes: No concerns

## 2023-03-08 NOTE — Patient Instructions (Addendum)
Austin Ortiz , Thank you for taking time to come for your Medicare Wellness Visit. I appreciate your ongoing commitment to your health goals. Please review the following plan we discussed and let me know if I can assist you in the future.   These are the goals we discussed:  Goals      DIET - INCREASE WATER INTAKE     Try to drink 6-8 glasses of water daily     Remain active and independent        This is a list of the screening recommended for you and due dates:  Health Maintenance  Topic Date Due   COVID-19 Vaccine (4 - 2023-24 season) 08/26/2022   Flu Shot  03/26/2023*   Eye exam for diabetics  04/05/2023   Complete foot exam   05/27/2023   Hemoglobin A1C  09/03/2023   Yearly kidney function blood test for diabetes  12/03/2023   Yearly kidney health urinalysis for diabetes  12/03/2023   Colon Cancer Screening  01/23/2024   Medicare Annual Wellness Visit  03/07/2024   DTaP/Tdap/Td vaccine (3 - Td or Tdap) 05/26/2032   Pneumonia Vaccine  Completed   Hepatitis C Screening: USPSTF Recommendation to screen - Ages 29-79 yo.  Completed   Zoster (Shingles) Vaccine  Completed   HPV Vaccine  Aged Out   Stool Blood Test  Discontinued  *Topic was postponed. The date shown is not the original due date.    Advanced directives: Please bring a copy of your health care power of attorney and living will to the office to be added to your chart at your convenience.   Conditions/risks identified: Aim for 30 minutes of exercise or brisk walking, 6-8 glasses of water, and 5 servings of fruits and vegetables each day.   Next appointment: Follow up in one year for your annual wellness visit.   Preventive Care 70 Years and Older, Male  Preventive care refers to lifestyle choices and visits with your health care provider that can promote health and wellness. What does preventive care include? A yearly physical exam. This is also called an annual well check. Dental exams once or twice a  year. Routine eye exams. Ask your health care provider how often you should have your eyes checked. Personal lifestyle choices, including: Daily care of your teeth and gums. Regular physical activity. Eating a healthy diet. Avoiding tobacco and drug use. Limiting alcohol use. Practicing safe sex. Taking low doses of aspirin every day. Taking vitamin and mineral supplements as recommended by your health care provider. What happens during an annual well check? The services and screenings done by your health care provider during your annual well check will depend on your age, overall health, lifestyle risk factors, and family history of disease. Counseling  Your health care provider may ask you questions about your: Alcohol use. Tobacco use. Drug use. Emotional well-being. Home and relationship well-being. Sexual activity. Eating habits. History of falls. Memory and ability to understand (cognition). Work and work Statistician. Screening  You may have the following tests or measurements: Height, weight, and BMI. Blood pressure. Lipid and cholesterol levels. These may be checked every 5 years, or more frequently if you are over 85 years old. Skin check. Lung cancer screening. You may have this screening every year starting at age 74 if you have a 30-pack-year history of smoking and currently smoke or have quit within the past 15 years. Fecal occult blood test (FOBT) of the stool. You may have this test  every year starting at age 27. Flexible sigmoidoscopy or colonoscopy. You may have a sigmoidoscopy every 5 years or a colonoscopy every 10 years starting at age 7. Prostate cancer screening. Recommendations will vary depending on your family history and other risks. Hepatitis C blood test. Hepatitis B blood test. Sexually transmitted disease (STD) testing. Diabetes screening. This is done by checking your blood sugar (glucose) after you have not eaten for a while (fasting). You may  have this done every 1-3 years. Abdominal aortic aneurysm (AAA) screening. You may need this if you are a current or former smoker. Osteoporosis. You may be screened starting at age 21 if you are at high risk. Talk with your health care provider about your test results, treatment options, and if necessary, the need for more tests. Vaccines  Your health care provider may recommend certain vaccines, such as: Influenza vaccine. This is recommended every year. Tetanus, diphtheria, and acellular pertussis (Tdap, Td) vaccine. You may need a Td booster every 10 years. Zoster vaccine. You may need this after age 69. Pneumococcal 13-valent conjugate (PCV13) vaccine. One dose is recommended after age 33. Pneumococcal polysaccharide (PPSV23) vaccine. One dose is recommended after age 46. Talk to your health care provider about which screenings and vaccines you need and how often you need them. This information is not intended to replace advice given to you by your health care provider. Make sure you discuss any questions you have with your health care provider. Document Released: 01/08/2016 Document Revised: 08/31/2016 Document Reviewed: 10/13/2015 Elsevier Interactive Patient Education  2017 [place] Prevention in the Home Falls can cause injuries. They can happen to people of all ages. There are many things you can do to make your home safe and to help prevent falls. What can I do on the outside of my home? Regularly fix the edges of walkways and driveways and fix any cracks. Remove anything that might make you trip as you walk through a door, such as a raised step or threshold. Trim any bushes or trees on the path to your home. Use bright outdoor lighting. Clear any walking paths of anything that might make someone trip, such as rocks or tools. Regularly check to see if handrails are loose or broken. Make sure that both sides of any steps have handrails. Any raised decks and porches  should have guardrails on the edges. Have any leaves, snow, or ice cleared regularly. Use sand or salt on walking paths during winter. Clean up any spills in your garage right away. This includes oil or grease spills. What can I do in the bathroom? Use night lights. Install grab bars by the toilet and in the tub and shower. Do not use towel bars as grab bars. Use non-skid mats or decals in the tub or shower. If you need to sit down in the shower, use a plastic, non-slip stool. Keep the floor dry. Clean up any water that spills on the floor as soon as it happens. Remove soap buildup in the tub or shower regularly. Attach bath mats securely with double-sided non-slip rug tape. Do not have throw rugs and other things on the floor that can make you trip. What can I do in the bedroom? Use night lights. Make sure that you have a light by your bed that is easy to reach. Do not use any sheets or blankets that are too big for your bed. They should not hang down onto the floor. Have a firm chair that has  side arms. You can use this for support while you get dressed. Do not have throw rugs and other things on the floor that can make you trip. What can I do in the kitchen? Clean up any spills right away. Avoid walking on wet floors. Keep items that you use a lot in easy-to-reach places. If you need to reach something above you, use a strong step stool that has a grab bar. Keep electrical cords out of the way. Do not use floor polish or wax that makes floors slippery. If you must use wax, use non-skid floor wax. Do not have throw rugs and other things on the floor that can make you trip. What can I do with my stairs? Do not leave any items on the stairs. Make sure that there are handrails on both sides of the stairs and use them. Fix handrails that are broken or loose. Make sure that handrails are as long as the stairways. Check any carpeting to make sure that it is firmly attached to the stairs.  Fix any carpet that is loose or worn. Avoid having throw rugs at the top or bottom of the stairs. If you do have throw rugs, attach them to the floor with carpet tape. Make sure that you have a light switch at the top of the stairs and the bottom of the stairs. If you do not have them, ask someone to add them for you. What else can I do to help prevent falls? Wear shoes that: Do not have high heels. Have rubber bottoms. Are comfortable and fit you well. Are closed at the toe. Do not wear sandals. If you use a stepladder: Make sure that it is fully opened. Do not climb a closed stepladder. Make sure that both sides of the stepladder are locked into place. Ask someone to hold it for you, if possible. Clearly mark and make sure that you can see: Any grab bars or handrails. First and last steps. Where the edge of each step is. Use tools that help you move around (mobility aids) if they are needed. These include: Canes. Walkers. Scooters. Crutches. Turn on the lights when you go into a dark area. Replace any light bulbs as soon as they burn out. Set up your furniture so you have a clear path. Avoid moving your furniture around. If any of your floors are uneven, fix them. If there are any pets around you, be aware of where they are. Review your medicines with your doctor. Some medicines can make you feel dizzy. This can increase your chance of falling. Ask your doctor what other things that you can do to help prevent falls. This information is not intended to replace advice given to you by your health care provider. Make sure you discuss any questions you have with your health care provider. Document Released: 10/08/2009 Document Revised: 05/19/2016 Document Reviewed: 01/16/2015 Elsevier Interactive Patient Education  2017 [employer].

## 2023-04-27 DIAGNOSIS — E119 Type 2 diabetes mellitus without complications: Secondary | ICD-10-CM | POA: Diagnosis not present

## 2023-04-27 DIAGNOSIS — G4733 Obstructive sleep apnea (adult) (pediatric): Secondary | ICD-10-CM | POA: Diagnosis not present

## 2023-04-27 DIAGNOSIS — I10 Essential (primary) hypertension: Secondary | ICD-10-CM | POA: Diagnosis not present

## 2023-05-30 ENCOUNTER — Other Ambulatory Visit: Payer: Self-pay | Admitting: Family Medicine

## 2023-06-08 ENCOUNTER — Ambulatory Visit (INDEPENDENT_AMBULATORY_CARE_PROVIDER_SITE_OTHER): Payer: Medicare HMO | Admitting: Family Medicine

## 2023-06-08 ENCOUNTER — Encounter: Payer: Self-pay | Admitting: Family Medicine

## 2023-06-08 VITALS — BP 114/71 | HR 72 | Ht 66.0 in | Wt 185.0 lb

## 2023-06-08 DIAGNOSIS — E785 Hyperlipidemia, unspecified: Secondary | ICD-10-CM

## 2023-06-08 DIAGNOSIS — I152 Hypertension secondary to endocrine disorders: Secondary | ICD-10-CM

## 2023-06-08 DIAGNOSIS — E1159 Type 2 diabetes mellitus with other circulatory complications: Secondary | ICD-10-CM

## 2023-06-08 DIAGNOSIS — E119 Type 2 diabetes mellitus without complications: Secondary | ICD-10-CM | POA: Insufficient documentation

## 2023-06-08 DIAGNOSIS — E1169 Type 2 diabetes mellitus with other specified complication: Secondary | ICD-10-CM | POA: Diagnosis not present

## 2023-06-08 DIAGNOSIS — Z7984 Long term (current) use of oral hypoglycemic drugs: Secondary | ICD-10-CM | POA: Diagnosis not present

## 2023-06-08 LAB — CBC WITH DIFFERENTIAL/PLATELET
Basophils Absolute: 0 10*3/uL (ref 0.0–0.2)
Basos: 1 %
EOS (ABSOLUTE): 0.2 10*3/uL (ref 0.0–0.4)
Eos: 3 %
Hematocrit: 46.7 % (ref 37.5–51.0)
Hemoglobin: 15.3 g/dL (ref 13.0–17.7)
Immature Grans (Abs): 0 10*3/uL (ref 0.0–0.1)
Immature Granulocytes: 0 %
Lymphocytes Absolute: 2.1 10*3/uL (ref 0.7–3.1)
Lymphs: 32 %
MCH: 29.4 pg (ref 26.6–33.0)
MCHC: 32.8 g/dL (ref 31.5–35.7)
MCV: 90 fL (ref 79–97)
Monocytes Absolute: 1 10*3/uL — ABNORMAL HIGH (ref 0.1–0.9)
Monocytes: 15 %
Neutrophils Absolute: 3.3 10*3/uL (ref 1.4–7.0)
Neutrophils: 49 %
Platelets: 284 10*3/uL (ref 150–450)
RBC: 5.21 x10E6/uL (ref 4.14–5.80)
RDW: 13.1 % (ref 11.6–15.4)
WBC: 6.6 10*3/uL (ref 3.4–10.8)

## 2023-06-08 LAB — LIPID PANEL
Chol/HDL Ratio: 3.8 ratio (ref 0.0–5.0)
Cholesterol, Total: 117 mg/dL (ref 100–199)
HDL: 31 mg/dL — ABNORMAL LOW (ref >39)
LDL Chol Calc (NIH): 52 mg/dL (ref 0–99)
Triglycerides: 209 mg/dL — ABNORMAL HIGH (ref 0–149)
VLDL Cholesterol Cal: 34 mg/dL (ref 5–40)

## 2023-06-08 LAB — CMP14+EGFR
ALT: 33 IU/L (ref 0–44)
AST: 26 IU/L (ref 0–40)
Albumin/Globulin Ratio: 1.8
Albumin: 4.4 g/dL (ref 3.9–4.9)
Alkaline Phosphatase: 52 IU/L (ref 44–121)
BUN/Creatinine Ratio: 24 (ref 10–24)
BUN: 26 mg/dL (ref 8–27)
Bilirubin Total: 0.3 mg/dL (ref 0.0–1.2)
CO2: 19 mmol/L — ABNORMAL LOW (ref 20–29)
Calcium: 9.6 mg/dL (ref 8.6–10.2)
Chloride: 102 mmol/L (ref 96–106)
Creatinine, Ser: 1.09 mg/dL (ref 0.76–1.27)
Globulin, Total: 2.4 g/dL (ref 1.5–4.5)
Glucose: 120 mg/dL — ABNORMAL HIGH (ref 70–99)
Potassium: 4.8 mmol/L (ref 3.5–5.2)
Sodium: 140 mmol/L (ref 134–144)
Total Protein: 6.8 g/dL (ref 6.0–8.5)
eGFR: 73 mL/min/{1.73_m2} (ref >59)

## 2023-06-08 LAB — BAYER DCA HB A1C WAIVED: HB A1C (BAYER DCA - WAIVED): 7.3 % — ABNORMAL HIGH (ref 4.8–5.6)

## 2023-06-08 MED ORDER — MELOXICAM 15 MG PO TABS: 15.0000 mg | ORAL_TABLET | Freq: Every day | ORAL | 3 refills | Status: DC

## 2023-06-08 MED ORDER — SIMVASTATIN 40 MG PO TABS: 40.0000 mg | ORAL_TABLET | Freq: Every evening | ORAL | 3 refills | Status: DC

## 2023-06-08 MED ORDER — JANUMET 50-1000 MG PO TABS: 1.0000 | ORAL_TABLET | Freq: Two times a day (BID) | ORAL | 1 refills | Status: DC

## 2023-06-08 MED ORDER — GLIMEPIRIDE 4 MG PO TABS: 4.0000 mg | ORAL_TABLET | Freq: Every day | ORAL | 3 refills | Status: DC

## 2023-06-08 MED ORDER — LISINOPRIL-HYDROCHLOROTHIAZIDE 10-12.5 MG PO TABS: 1.0000 | ORAL_TABLET | Freq: Every day | ORAL | 3 refills | Status: DC

## 2023-06-08 MED ORDER — DAPAGLIFLOZIN PROPANEDIOL 10 MG PO TABS: 10.0000 mg | ORAL_TABLET | Freq: Every day | ORAL | 5 refills | Status: DC

## 2023-06-08 NOTE — Progress Notes (Signed)
BP 114/71   Pulse 72   Ht 5\' 6"  (1.676 m)   Wt 185 lb (83.9 kg)   SpO2 97%   BMI 29.86 kg/m    Subjective:   Patient ID: Austin Ortiz, male    DOB: [birth year], 72 y.o.   MRN: [id number]  HPI: Austin Ortiz is a 72 y.o. male presenting on 06/08/2023 for Medical Management of Chronic Issues, Diabetes, Hyperlipidemia, and Hypertension   HPI Type 2 diabetes mellitus Patient comes in today for recheck of his diabetes. Patient has been currently taking glimepiride and [place] and Janumet. Patient is currently on an ACE inhibitor/ARB. Patient has not seen an ophthalmologist this year. Patient denies any new issues with their feet. The symptom started onset as an adult hypertension and hyperlipidemia ARE RELATED TO DM   Hypertension Patient is currently on lisinopril hydrochlorothiazide, and their blood pressure today is 114/71. Patient denies any lightheadedness or dizziness. Patient denies headaches, blurred vision, chest pains, shortness of breath, or weakness. Denies any side effects from medication and is content with current medication.   Hyperlipidemia Patient is coming in for recheck of his hyperlipidemia. The patient is currently taking fish oils and simvastatin. They deny any issues with myalgias or history of liver damage from it. They deny any focal numbness or weakness or chest pain.   Relevant past medical, surgical, family and social history reviewed and updated as indicated. Interim medical history since our last visit reviewed. Allergies and medications reviewed and updated.  Review of Systems  Constitutional:  Negative for chills and fever.  Eyes:  Negative for visual disturbance.  Respiratory:  Negative for shortness of breath and wheezing.   Cardiovascular:  Negative for chest pain and leg swelling.  Musculoskeletal:  Negative for back pain and gait problem.  Skin:  Negative for rash.  Neurological:  Negative for dizziness, weakness and light-headedness.  All  other systems reviewed and are negative.   Per HPI unless specifically indicated above   Allergies as of 06/08/2023       Reactions   Alpha-gal    Cetuximab    Pt denies         Medication List        Accurate as of June 08, 2023  8:25 AM. If you have any questions, ask your nurse or doctor.          STOP taking these medications    guaiFENesin 600 MG 12 hr tablet Commonly known as: Mucinex Stopped by: Elige Radon Annai Heick, MD       TAKE these medications    aspirin 81 MG tablet Take 81 mg by mouth daily.   dapagliflozin propanediol 10 MG Tabs tablet Commonly known as: Farxiga Take 1 tablet (10 mg total) by mouth daily.   Fish Oil 600 MG Caps Take 3,000 mg by mouth daily.   glimepiride 4 MG tablet Commonly known as: AMARYL Take 1 tablet (4 mg total) by mouth daily with breakfast.   glucosamine-chondroitin 500-400 MG tablet Take 1 tablet by mouth 3 (three) times daily.   Janumet 50-1000 MG tablet Generic drug: sitaGLIPtin-metformin Take 1 tablet by mouth 2 (two) times daily with a meal.   lisinopril-hydrochlorothiazide 10-12.5 MG tablet Commonly known as: ZESTORETIC Take 1 tablet by mouth daily.   meloxicam 15 MG tablet Commonly known as: MOBIC Take 1 tablet (15 mg total) by mouth daily.   multivitamin tablet Take 1 tablet by mouth daily.   omeprazole 40 MG capsule Commonly known as:  PRILOSEC Take 1 capsule (40 mg total) by mouth daily.   sildenafil 20 MG tablet Commonly known as: REVATIO TAKE 1 TO 3 TABLETS BY MOUTH AS NEEDED   simvastatin 40 MG tablet Commonly known as: ZOCOR Take 1 tablet (40 mg total) by mouth every evening.         Objective:   BP 114/71   Pulse 72   Ht 5\' 6"  (1.676 m)   Wt 185 lb (83.9 kg)   SpO2 97%   BMI 29.86 kg/m   Wt Readings from Last 3 Encounters:  06/08/23 185 lb (83.9 kg)  03/08/23 187 lb (84.8 kg)  03/03/23 187 lb (84.8 kg)    Physical Exam Vitals and nursing note reviewed.   Constitutional:      General: He is not in acute distress.    Appearance: He is well-developed. He is not diaphoretic.  Eyes:     General: No scleral icterus.    Conjunctiva/sclera: Conjunctivae normal.  Neck:     Thyroid: No thyromegaly.  Cardiovascular:     Rate and Rhythm: Normal rate and regular rhythm.     Heart sounds: Normal heart sounds. No murmur heard. Pulmonary:     Effort: Pulmonary effort is normal. No respiratory distress.     Breath sounds: Normal breath sounds. No wheezing.  Musculoskeletal:        General: No swelling. Normal range of motion.     Cervical back: Neck supple.  Lymphadenopathy:     Cervical: No cervical adenopathy.  Skin:    General: Skin is warm and dry.     Findings: No rash.  Neurological:     Mental Status: He is alert and oriented to person, place, and time.     Coordination: Coordination normal.  Psychiatric:        Behavior: Behavior normal.       Assessment & Plan:   Problem List Items Addressed This Visit       Cardiovascular and Mediastinum   Hypertension associated with diabetes (HCC)   Relevant Orders   CBC with Differential/Platelet   CMP14+EGFR   Lipid panel   Bayer DCA Hb A1c Waived     Endocrine   T2DM (type 2 diabetes mellitus) (HCC) - Primary   Relevant Orders   CBC with Differential/Platelet   CMP14+EGFR   Lipid panel   Bayer DCA Hb A1c Waived   Hyperlipidemia associated with type 2 diabetes mellitus (HCC)   Relevant Orders   CBC with Differential/Platelet   CMP14+EGFR   Lipid panel   Bayer DCA Hb A1c Waived   Diabetes mellitus treated with oral medication (HCC)    A1c 7.3: Diabetes is really not changed for him, continue to focus on diet, discussed dietary measures with the patient. Follow up plan: Return in about 3 months (around 09/08/2023), or if symptoms worsen or fail to improve, for Diabetes and hypertension and cholesterol.  Counseling provided for all of the vaccine components Orders Placed  This Encounter  Procedures   CBC with Differential/Platelet   CMP14+EGFR   Lipid panel   Bayer DCA Hb A1c Waived    Arville Care, MD [place] [facility] [place] [facility] 06/08/2023, 8:25 AM

## 2023-08-08 DIAGNOSIS — E785 Hyperlipidemia, unspecified: Secondary | ICD-10-CM | POA: Diagnosis not present

## 2023-08-08 DIAGNOSIS — Z833 Family history of diabetes mellitus: Secondary | ICD-10-CM | POA: Diagnosis not present

## 2023-08-08 DIAGNOSIS — N529 Male erectile dysfunction, unspecified: Secondary | ICD-10-CM | POA: Diagnosis not present

## 2023-08-08 DIAGNOSIS — G4733 Obstructive sleep apnea (adult) (pediatric): Secondary | ICD-10-CM | POA: Diagnosis not present

## 2023-08-08 DIAGNOSIS — Z7982 Long term (current) use of aspirin: Secondary | ICD-10-CM | POA: Diagnosis not present

## 2023-08-08 DIAGNOSIS — M1990 Unspecified osteoarthritis, unspecified site: Secondary | ICD-10-CM | POA: Diagnosis not present

## 2023-08-08 DIAGNOSIS — I10 Essential (primary) hypertension: Secondary | ICD-10-CM | POA: Diagnosis not present

## 2023-08-08 DIAGNOSIS — K219 Gastro-esophageal reflux disease without esophagitis: Secondary | ICD-10-CM | POA: Diagnosis not present

## 2023-08-08 DIAGNOSIS — Z8249 Family history of ischemic heart disease and other diseases of the circulatory system: Secondary | ICD-10-CM | POA: Diagnosis not present

## 2023-08-08 DIAGNOSIS — E119 Type 2 diabetes mellitus without complications: Secondary | ICD-10-CM | POA: Diagnosis not present

## 2023-08-08 DIAGNOSIS — Z7984 Long term (current) use of oral hypoglycemic drugs: Secondary | ICD-10-CM | POA: Diagnosis not present

## 2023-08-08 DIAGNOSIS — Z91014 Allergy to mammalian meats: Secondary | ICD-10-CM | POA: Diagnosis not present

## 2023-09-15 ENCOUNTER — Ambulatory Visit: Payer: Medicare HMO | Admitting: Family Medicine

## 2023-09-22 ENCOUNTER — Encounter: Payer: Self-pay | Admitting: Family Medicine

## 2023-09-22 ENCOUNTER — Ambulatory Visit (INDEPENDENT_AMBULATORY_CARE_PROVIDER_SITE_OTHER): Payer: Medicare HMO | Admitting: Family Medicine

## 2023-09-22 VITALS — BP 139/72 | HR 72 | Ht 66.0 in | Wt 189.0 lb

## 2023-09-22 DIAGNOSIS — E785 Hyperlipidemia, unspecified: Secondary | ICD-10-CM | POA: Diagnosis not present

## 2023-09-22 DIAGNOSIS — Z23 Encounter for immunization: Secondary | ICD-10-CM

## 2023-09-22 DIAGNOSIS — Z7984 Long term (current) use of oral hypoglycemic drugs: Secondary | ICD-10-CM | POA: Diagnosis not present

## 2023-09-22 DIAGNOSIS — E119 Type 2 diabetes mellitus without complications: Secondary | ICD-10-CM

## 2023-09-22 DIAGNOSIS — E1169 Type 2 diabetes mellitus with other specified complication: Secondary | ICD-10-CM

## 2023-09-22 DIAGNOSIS — E1159 Type 2 diabetes mellitus with other circulatory complications: Secondary | ICD-10-CM | POA: Diagnosis not present

## 2023-09-22 DIAGNOSIS — I152 Hypertension secondary to endocrine disorders: Secondary | ICD-10-CM

## 2023-09-22 LAB — BAYER DCA HB A1C WAIVED: HB A1C (BAYER DCA - WAIVED): 7.4 % — ABNORMAL HIGH (ref 4.8–5.6)

## 2023-09-22 NOTE — Progress Notes (Signed)
BP 139/72   Pulse 72   Ht 5\' 6"  (1.676 m)   Wt 189 lb (85.7 kg)   SpO2 95%   BMI 30.51 kg/m    Subjective:   Patient ID: Austin Ortiz, male    DOB: 10/28/1951, 72 y.o.   MRN: [id number]  HPI: Austin Ortiz is a 72 y.o. male presenting on 09/22/2023 for Medical Management of Chronic Issues and Diabetes   HPI Type 2 diabetes mellitus Patient comes in today for recheck of his diabetes. Patient has been currently taking [place] and glimepiride and Janumet. Patient is currently on an ACE inhibitor/ARB. Patient has not seen an ophthalmologist this year. Patient denies any new issues with their feet. The symptom started onset as an adult hypertension and hyperlipidemia ARE RELATED TO DM   Hypertension Patient is currently on lisinopril hydrochlorothiazide, and their blood pressure today is 139/72. Patient denies any lightheadedness or dizziness. Patient denies headaches, blurred vision, chest pains, shortness of breath, or weakness. Denies any side effects from medication and is content with current medication.   Hyperlipidemia Patient is coming in for recheck of his hyperlipidemia. The patient is currently taking fish oils and simvastatin. They deny any issues with myalgias or history of liver damage from it. They deny any focal numbness or weakness or chest pain.   Relevant past medical, surgical, family and social history reviewed and updated as indicated. Interim medical history since our last visit reviewed. Allergies and medications reviewed and updated.  Review of Systems  Constitutional:  Negative for chills and fever.  Eyes:  Negative for visual disturbance.  Respiratory:  Negative for shortness of breath and wheezing.   Cardiovascular:  Negative for chest pain and leg swelling.  Musculoskeletal:  Negative for back pain and gait problem.  Skin:  Negative for rash.  Neurological:  Negative for dizziness, weakness and light-headedness.  All other systems reviewed and are  negative.   Per HPI unless specifically indicated above   Allergies as of 09/22/2023       Reactions   Alpha-gal    Cetuximab    Pt denies         Medication List        Accurate as of September 22, 2023  8:48 AM. If you have any questions, ask your nurse or doctor.          aspirin 81 MG tablet Take 81 mg by mouth daily.   dapagliflozin propanediol 10 MG Tabs tablet Commonly known as: Farxiga Take 1 tablet (10 mg total) by mouth daily.   Fish Oil 600 MG Caps Take 3,000 mg by mouth daily.   glimepiride 4 MG tablet Commonly known as: AMARYL Take 1 tablet (4 mg total) by mouth daily with breakfast.   glucosamine-chondroitin 500-400 MG tablet Take 1 tablet by mouth 3 (three) times daily.   Janumet 50-1000 MG tablet Generic drug: sitaGLIPtin-metformin Take 1 tablet by mouth 2 (two) times daily with a meal.   lisinopril-hydrochlorothiazide 10-12.5 MG tablet Commonly known as: ZESTORETIC Take 1 tablet by mouth daily.   meloxicam 15 MG tablet Commonly known as: MOBIC Take 1 tablet (15 mg total) by mouth daily.   multivitamin tablet Take 1 tablet by mouth daily.   omeprazole 40 MG capsule Commonly known as: PRILOSEC Take 1 capsule (40 mg total) by mouth daily.   sildenafil 20 MG tablet Commonly known as: REVATIO TAKE 1 TO 3 TABLETS BY MOUTH AS NEEDED   simvastatin 40 MG tablet Commonly known  as: ZOCOR Take 1 tablet (40 mg total) by mouth every evening.         Objective:   BP 139/72   Pulse 72   Ht 5\' 6"  (1.676 m)   Wt 189 lb (85.7 kg)   SpO2 95%   BMI 30.51 kg/m   Wt Readings from Last 3 Encounters:  09/22/23 189 lb (85.7 kg)  06/08/23 185 lb (83.9 kg)  03/08/23 187 lb (84.8 kg)    Physical Exam Vitals and nursing note reviewed.  Constitutional:      General: He is not in acute distress.    Appearance: He is well-developed. He is not diaphoretic.  Eyes:     General: No scleral icterus.    Conjunctiva/sclera: Conjunctivae normal.   Neck:     Thyroid: No thyromegaly.  Cardiovascular:     Rate and Rhythm: Normal rate and regular rhythm.     Heart sounds: Normal heart sounds. No murmur heard. Pulmonary:     Effort: Pulmonary effort is normal. No respiratory distress.     Breath sounds: Normal breath sounds. No wheezing.  Musculoskeletal:        General: Normal range of motion.     Cervical back: Neck supple.  Lymphadenopathy:     Cervical: No cervical adenopathy.  Skin:    General: Skin is warm and dry.     Findings: No rash.  Neurological:     Mental Status: He is alert and oriented to person, place, and time.     Coordination: Coordination normal.  Psychiatric:        Behavior: Behavior normal.       Assessment & Plan:   Problem List Items Addressed This Visit       Cardiovascular and Mediastinum   Hypertension associated with diabetes (HCC)     Endocrine   T2DM (type 2 diabetes mellitus) (HCC) - Primary   Relevant Orders   Bayer DCA Hb A1c Waived   Hyperlipidemia associated with type 2 diabetes mellitus (HCC)   Diabetes mellitus treated with oral medication (HCC)    A1c 7.4, about the same as before.  Discussed possibly going on to an injectable and we will continue to discuss it for the future.  Blood pressure and everything else looks decent.  No changes Follow up plan: Return in about 3 months (around 12/22/2023), or if symptoms worsen or fail to improve, for diabetes.  Counseling provided for all of the vaccine components Orders Placed This Encounter  Procedures   Bayer DCA Hb A1c Waived    Arville Care, MD [facility] 09/22/2023, 8:48 AM

## 2023-11-13 ENCOUNTER — Telehealth: Payer: Self-pay

## 2023-11-13 ENCOUNTER — Ambulatory Visit: Payer: Medicare HMO

## 2023-11-13 DIAGNOSIS — E1169 Type 2 diabetes mellitus with other specified complication: Secondary | ICD-10-CM

## 2023-11-13 LAB — HM DIABETES EYE EXAM

## 2023-11-13 NOTE — Progress Notes (Signed)
Austin Ortiz arrived 11/13/2023 and has given verbal consent to obtain images and complete their overdue diabetic retinal screening.  The images have been sent to an ophthalmologist or optometrist for review and interpretation.  Results will be sent back to Dettinger, Elige Radon, MD for review.  Patient has been informed they will be contacted when we receive the results via telephone or MyChart

## 2023-11-13 NOTE — Progress Notes (Signed)
Pharmacy Medication Assistance Program Note    11/13/2023  Patient ID: Austin Ortiz, male   DOB: 1951-10-21, 72 y.o.   MRN: [id number]     11/13/2023  Outreach [facility] [facility]  Manufacturer [employer] Nurse, adult Drugs Marcelline Deist  Dose of Farxiga 10mg   Type of Radiographer, therapeutic Assistance  Patient Assistance Determination Approved  Approval Start Date 12/27/2023  Approval End Date 12/25/2024

## 2023-11-27 NOTE — Telephone Encounter (Signed)
Copied from CRM [id number]. Topic: General - Other >> Nov 27, 2023  9:42 AM Amy B wrote: Reason for CRM: Patient's wife, Babette Relic, called asking if the [employer] paperwork had been filled out yet.  She would like to pick it up and requests a call back to discuss, [phone].

## 2023-11-28 ENCOUNTER — Encounter: Payer: Self-pay | Admitting: Pharmacist

## 2023-11-28 NOTE — Telephone Encounter (Signed)
[employer] requires [employer] application I'll check my box on Thursday My chart message sent to patient today

## 2023-11-28 NOTE — Telephone Encounter (Signed)
Copied from CRM [id number]. Topic: General - Other >> Nov 28, 2023 10:28 AM Amy B wrote: Reason for CRM: Patient's wife, Babette Relic, called asking if the [employer] paperwork had been filled out yet.  She would like to pick it up and requests a call back to discuss.  She does state that all of the patient's portion of the paperwork had been filled out and the rest needs to be completed by the provider.  Please call her at [phone].

## 2023-12-04 NOTE — Telephone Encounter (Signed)
Patient elected to send completed [employer] app to company.   Copy scanned to patients media.

## 2023-12-15 ENCOUNTER — Telehealth: Payer: Self-pay | Admitting: Family Medicine

## 2023-12-15 ENCOUNTER — Encounter: Payer: Self-pay | Admitting: Family Medicine

## 2023-12-15 MED ORDER — METFORMIN HCL 1000 MG PO TABS: 1000.0000 mg | ORAL_TABLET | Freq: Every day | ORAL | 0 refills | Status: DC

## 2023-12-15 NOTE — Telephone Encounter (Signed)
His blood sugars have not been under control so I would not reduce it to 1/day instead of 2 unless he absolutely has to.  I guess taking 1 a day is better than none per day.  The only thing that we have samples of that works similar to [employer] but is stronger are Museum/gallery exhibitions officer which are injections.  I have no problem switching him to injections but I do not think giving samples for 1 or 2 weeks of injections would be beneficial and less we are making the entire switch.  Send him a prescription of metformin 1000 daily and give him 30 days so that if he cannot take the Januvia twice a day he can at least take the Januvia once a day and take the metformin the other time.  His Januvia has metformin and it

## 2023-12-15 NOTE — Telephone Encounter (Signed)
Copied from CRM [id number]. Topic: Clinical - Prescription Issue >> Dec 15, 2023 10:07 AM Carlatta H wrote: Reason for CRM: sitaGLIPtin-metformin (JANUMET) 50-1000 MG tablet//Patients wife called to advised he only has a week supply left//She wants to know if she can pick up samples to last thru the holiday//Please call [phone]

## 2023-12-15 NOTE — Telephone Encounter (Signed)
Pts wife made aware. Avoiding injections. Request metformin instead.  Metformin 1000mg  every day sent to [facility]

## 2023-12-15 NOTE — Telephone Encounter (Signed)
Informed wife that we do not receive samples of Janumet.  Wants to know if another medication can take the place of Janumet that we do have samples of.  Or can pt take Janumet one per day instead of 2 per day. So he can make it until his shipment comes.

## 2023-12-28 ENCOUNTER — Encounter: Payer: Self-pay | Admitting: Family Medicine

## 2023-12-28 ENCOUNTER — Ambulatory Visit: Payer: Medicare HMO | Admitting: Family Medicine

## 2023-12-28 ENCOUNTER — Encounter: Payer: Self-pay | Admitting: Gastroenterology

## 2023-12-28 VITALS — BP 161/79 | HR 69 | Ht 66.0 in | Wt 188.0 lb

## 2023-12-28 DIAGNOSIS — E785 Hyperlipidemia, unspecified: Secondary | ICD-10-CM

## 2023-12-28 DIAGNOSIS — Z7984 Long term (current) use of oral hypoglycemic drugs: Secondary | ICD-10-CM

## 2023-12-28 DIAGNOSIS — I152 Hypertension secondary to endocrine disorders: Secondary | ICD-10-CM | POA: Diagnosis not present

## 2023-12-28 DIAGNOSIS — E1159 Type 2 diabetes mellitus with other circulatory complications: Secondary | ICD-10-CM

## 2023-12-28 DIAGNOSIS — E1169 Type 2 diabetes mellitus with other specified complication: Secondary | ICD-10-CM | POA: Diagnosis not present

## 2023-12-28 LAB — BAYER DCA HB A1C WAIVED: HB A1C (BAYER DCA - WAIVED): 6.8 % — ABNORMAL HIGH (ref 4.8–5.6)

## 2023-12-28 MED ORDER — JANUMET 50-1000 MG PO TABS: 1.0000 | ORAL_TABLET | Freq: Two times a day (BID) | ORAL | 4 refills | Status: DC

## 2023-12-28 MED ORDER — DAPAGLIFLOZIN PROPANEDIOL 10 MG PO TABS: 10.0000 mg | ORAL_TABLET | Freq: Every day | ORAL | 4 refills | Status: DC

## 2023-12-28 NOTE — Patient Instructions (Signed)
 Please call [facility] for your colonoscopy. [phone].

## 2023-12-28 NOTE — Progress Notes (Signed)
 BP (!) 161/79   Pulse 69   Ht 5' 6 (1.676 m)   Wt 188 lb (85.3 kg)   SpO2 98%   BMI 30.34 kg/m    Subjective:   Patient ID: Austin Ortiz, male    DOB: 1951/09/01, 73 y.o.   MRN: [id number]  HPI: Austin Ortiz is a 73 y.o. male presenting on 12/28/2023 for Medical Management of Chronic Issues, Hyperlipidemia, Hypertension, and Diabetes   HPI Type 2 diabetes mellitus Patient comes in today for recheck of his diabetes. Patient has been currently taking Janumet  and glimepiride  and Farxiga . Patient is currently on an ACE inhibitor/ARB. Patient has seen an ophthalmologist this year. Patient denies any new issues with their feet. The symptom started onset as an adult hypertension and hyperlipidemia ARE RELATED TO DM   Hypertension Patient is currently on lisinopril  hydrochlorothiazide , and their blood pressure today is 136/59. Patient denies any lightheadedness or dizziness. Patient denies headaches, blurred vision, chest pains, shortness of breath, or weakness. Denies any side effects from medication and is content with current medication.   Hyperlipidemia Patient is coming in for recheck of his hyperlipidemia. The patient is currently taking simvastatin . They deny any issues with myalgias or history of liver damage from it. They deny any focal numbness or weakness or chest pain.   Relevant past medical, surgical, family and social history reviewed and updated as indicated. Interim medical history since our last visit reviewed. Allergies and medications reviewed and updated.  Review of Systems  Constitutional:  Negative for chills and fever.  Eyes:  Negative for visual disturbance.  Respiratory:  Negative for shortness of breath and wheezing.   Cardiovascular:  Negative for chest pain and leg swelling.  Musculoskeletal:  Positive for back pain (Has a little bit of low back pain over the past day since lifting a tire but he is going to rest and see if it can get better on its own).  Negative for gait problem.  Skin:  Negative for rash.  Neurological:  Negative for dizziness and light-headedness.  All other systems reviewed and are negative.   Per HPI unless specifically indicated above   Allergies as of 12/28/2023       Reactions   Alpha-gal    Cetuximab    Pt denies         Medication List        Accurate as of December 28, 2023 11:11 AM. If you have any questions, ask your nurse or doctor.          STOP taking these medications    metFORMIN  1000 MG tablet Commonly known as: GLUCOPHAGE  Stopped by: Fonda LABOR Mariette Cowley       TAKE these medications    aspirin 81 MG tablet Take 81 mg by mouth daily.   dapagliflozin  propanediol 10 MG Tabs tablet Commonly known as: Farxiga  Take 1 tablet (10 mg total) by mouth daily.   Fish Oil 600 MG Caps Take 3,000 mg by mouth daily.   glimepiride  4 MG tablet Commonly known as: AMARYL  Take 1 tablet (4 mg total) by mouth daily with breakfast.   glucosamine-chondroitin 500-400 MG tablet Take 1 tablet by mouth 3 (three) times daily.   Janumet  50-1000 MG tablet Generic drug: sitaGLIPtin -metformin  Take 1 tablet by mouth 2 (two) times daily with a meal.   lisinopril -hydrochlorothiazide  10-12.5 MG tablet Commonly known as: ZESTORETIC  Take 1 tablet by mouth daily.   meloxicam  15 MG tablet Commonly known as: MOBIC  Take 1 tablet (  15 mg total) by mouth daily.   multivitamin tablet Take 1 tablet by mouth daily.   omeprazole  40 MG capsule Commonly known as: PRILOSEC Take 1 capsule (40 mg total) by mouth daily.   sildenafil  20 MG tablet Commonly known as: REVATIO  TAKE 1 TO 3 TABLETS BY MOUTH AS NEEDED   simvastatin  40 MG tablet Commonly known as: ZOCOR  Take 1 tablet (40 mg total) by mouth every evening.         Objective:   BP (!) 161/79   Pulse 69   Ht 5' 6 (1.676 m)   Wt 188 lb (85.3 kg)   SpO2 98%   BMI 30.34 kg/m   Wt Readings from Last 3 Encounters:  12/28/23 188 lb (85.3 kg)   09/22/23 189 lb (85.7 kg)  06/08/23 185 lb (83.9 kg)    Physical Exam Vitals and nursing note reviewed.  Constitutional:      General: He is not in acute distress.    Appearance: He is well-developed. He is not diaphoretic.  Eyes:     General: No scleral icterus.    Conjunctiva/sclera: Conjunctivae normal.  Neck:     Thyroid : No thyromegaly.  Cardiovascular:     Rate and Rhythm: Normal rate and regular rhythm.     Heart sounds: Normal heart sounds. No murmur heard. Pulmonary:     Effort: Pulmonary effort is normal. No respiratory distress.     Breath sounds: Normal breath sounds. No wheezing.  Musculoskeletal:        General: No swelling.     Cervical back: Neck supple.  Lymphadenopathy:     Cervical: No cervical adenopathy.  Skin:    General: Skin is warm and dry.     Findings: No rash.  Neurological:     Mental Status: He is alert and oriented to person, place, and time.     Coordination: Coordination normal.  Psychiatric:        Behavior: Behavior normal.     Results for orders placed or performed in visit on 11/20/23  HM DIABETES EYE EXAM   Collection Time: 11/13/23 12:00 AM  Result Value Ref Range   HM Diabetic Eye Exam No Retinopathy No Retinopathy    Assessment & Plan:   Problem List Items Addressed This Visit       Cardiovascular and Mediastinum   Hypertension associated with diabetes (HCC)   Relevant Medications   dapagliflozin  propanediol (FARXIGA ) 10 MG TABS tablet   sitaGLIPtin -metformin  (JANUMET ) 50-1000 MG tablet   Other Relevant Orders   CBC with Differential/Platelet   CMP14+EGFR   Lipid panel   Bayer DCA Hb A1c Waived     Endocrine   T2DM (type 2 diabetes mellitus) (HCC) - Primary   Relevant Medications   dapagliflozin  propanediol (FARXIGA ) 10 MG TABS tablet   sitaGLIPtin -metformin  (JANUMET ) 50-1000 MG tablet   Other Relevant Orders   CBC with Differential/Platelet   CMP14+EGFR   Lipid panel   Bayer DCA Hb A1c Waived    Hyperlipidemia associated with type 2 diabetes mellitus (HCC)   Relevant Medications   dapagliflozin  propanediol (FARXIGA ) 10 MG TABS tablet   sitaGLIPtin -metformin  (JANUMET ) 50-1000 MG tablet   Other Relevant Orders   CBC with Differential/Platelet   CMP14+EGFR   Lipid panel   Bayer DCA Hb A1c Waived    Blood sugars been running around 150 in the mornings and then taper down throughout the day and run better through the day. Blood pressure on recheck was good.  A1c is  6.8, it is looking a lot better, continue with current medicine and continue to focus on diet. Follow up plan: Return in about 3 months (around 03/27/2024), or if symptoms worsen or fail to improve, for Diabetes recheck.  Counseling provided for all of the vaccine components Orders Placed This Encounter  Procedures   CBC with Differential/Platelet   CMP14+EGFR   Lipid panel   Bayer DCA Hb A1c Waived    Fonda Levins, MD [facility] 12/28/2023, 11:11 AM

## 2023-12-29 ENCOUNTER — Telehealth: Payer: Self-pay | Admitting: Family Medicine

## 2023-12-29 DIAGNOSIS — E1169 Type 2 diabetes mellitus with other specified complication: Secondary | ICD-10-CM

## 2023-12-29 LAB — CBC WITH DIFFERENTIAL/PLATELET
Basophils Absolute: 0 10*3/uL (ref 0.0–0.2)
Basos: 1 %
EOS (ABSOLUTE): 0.1 10*3/uL (ref 0.0–0.4)
Eos: 1 %
Hematocrit: 47.9 % (ref 37.5–51.0)
Hemoglobin: 15.2 g/dL (ref 13.0–17.7)
Immature Grans (Abs): 0 10*3/uL (ref 0.0–0.1)
Immature Granulocytes: 0 %
Lymphocytes Absolute: 2.6 10*3/uL (ref 0.7–3.1)
Lymphs: 30 %
MCH: 28.8 pg (ref 26.6–33.0)
MCHC: 31.7 g/dL (ref 31.5–35.7)
MCV: 91 fL (ref 79–97)
Monocytes Absolute: 0.8 10*3/uL (ref 0.1–0.9)
Monocytes: 9 %
Neutrophils Absolute: 5.1 10*3/uL (ref 1.4–7.0)
Neutrophils: 59 %
Platelets: 306 10*3/uL (ref 150–450)
RBC: 5.27 x10E6/uL (ref 4.14–5.80)
RDW: 13 % (ref 11.6–15.4)
WBC: 8.6 10*3/uL (ref 3.4–10.8)

## 2023-12-29 LAB — CMP14+EGFR
ALT: 33 [IU]/L (ref 0–44)
AST: 28 [IU]/L (ref 0–40)
Albumin: 4.1 g/dL (ref 3.8–4.8)
Alkaline Phosphatase: 52 [IU]/L (ref 44–121)
BUN/Creatinine Ratio: 14 (ref 10–24)
BUN: 13 mg/dL (ref 8–27)
Bilirubin Total: 0.2 mg/dL (ref 0.0–1.2)
CO2: 23 mmol/L (ref 20–29)
Calcium: 9.6 mg/dL (ref 8.6–10.2)
Chloride: 102 mmol/L (ref 96–106)
Creatinine, Ser: 0.94 mg/dL (ref 0.76–1.27)
Globulin, Total: 3 g/dL (ref 1.5–4.5)
Glucose: 145 mg/dL — ABNORMAL HIGH (ref 70–99)
Potassium: 5.2 mmol/L (ref 3.5–5.2)
Sodium: 140 mmol/L (ref 134–144)
Total Protein: 7.1 g/dL (ref 6.0–8.5)
eGFR: 87 mL/min/{1.73_m2} (ref >59)

## 2023-12-29 LAB — LIPID PANEL
Chol/HDL Ratio: 3.8 {ratio} (ref 0.0–5.0)
Cholesterol, Total: 106 mg/dL (ref 100–199)
HDL: 28 mg/dL — ABNORMAL LOW (ref >39)
LDL Chol Calc (NIH): 46 mg/dL (ref 0–99)
Triglycerides: 192 mg/dL — ABNORMAL HIGH (ref 0–149)
VLDL Cholesterol Cal: 32 mg/dL (ref 5–40)

## 2023-12-29 MED ORDER — DAPAGLIFLOZIN PROPANEDIOL 10 MG PO TABS: 10.0000 mg | ORAL_TABLET | Freq: Every day | ORAL | 4 refills | Status: DC

## 2023-12-29 NOTE — Telephone Encounter (Unsigned)
 Copied from CRM [id number]. Topic: Clinical - Prescription Issue >> Dec 29, 2023  1:31 PM Jamee ORN wrote: Reason for CRM: Patient's wife called in requesting that the patients needs his dapagliflozin  propanediol (FARXIGA ) 10 MG TABS tablet, but it was over $600. [facility] needs a referral for the medication so the patient can receive it at a discounted price. Please contact patient at [phone] if there are any questions.

## 2023-12-29 NOTE — Telephone Encounter (Signed)
 Sent refill to [facility] takes for the patient.

## 2024-01-02 ENCOUNTER — Encounter: Payer: Self-pay | Admitting: Family Medicine

## 2024-01-02 DIAGNOSIS — E1169 Type 2 diabetes mellitus with other specified complication: Secondary | ICD-10-CM

## 2024-01-03 MED ORDER — DAPAGLIFLOZIN PROPANEDIOL 10 MG PO TABS: 10.0000 mg | ORAL_TABLET | Freq: Every day | ORAL | 4 refills | Status: DC

## 2024-01-26 ENCOUNTER — Ambulatory Visit (AMBULATORY_SURGERY_CENTER): Payer: Medicare HMO

## 2024-01-26 VITALS — Ht 65.0 in | Wt 185.0 lb

## 2024-01-26 DIAGNOSIS — Z860100 Personal history of colon polyps, unspecified: Secondary | ICD-10-CM

## 2024-01-26 MED ORDER — SUFLAVE 178.7 G PO SOLR: 1.0000 | ORAL | 0 refills | Status: DC

## 2024-01-26 NOTE — Progress Notes (Signed)

## 2024-02-06 ENCOUNTER — Telehealth: Payer: Self-pay | Admitting: Pharmacist

## 2024-02-06 ENCOUNTER — Encounter: Payer: Self-pay | Admitting: Gastroenterology

## 2024-02-06 DIAGNOSIS — E1169 Type 2 diabetes mellitus with other specified complication: Secondary | ICD-10-CM

## 2024-02-06 MED ORDER — DAPAGLIFLOZIN PROPANEDIOL 10 MG PO TABS: 10.0000 mg | ORAL_TABLET | Freq: Every day | ORAL | 4 refills | Status: DC

## 2024-02-06 NOTE — Telephone Encounter (Signed)
   Patient [place] the [place] for [place].  Updated RX escribed to [facility] mail order (pharmacy for [place] patient assistance).  Patient is stable on current regimen.  Route me PAP if needed.  Thank you! Raynelle Fanning

## 2024-02-13 ENCOUNTER — Ambulatory Visit (AMBULATORY_SURGERY_CENTER): Payer: Medicare HMO | Admitting: Gastroenterology

## 2024-02-13 ENCOUNTER — Encounter: Payer: Self-pay | Admitting: Gastroenterology

## 2024-02-13 VITALS — BP 111/62 | HR 69 | Temp 98.2°F | Resp 14 | Ht 65.0 in | Wt 185.0 lb

## 2024-02-13 DIAGNOSIS — Z860101 Personal history of adenomatous and serrated colon polyps: Secondary | ICD-10-CM | POA: Diagnosis not present

## 2024-02-13 DIAGNOSIS — Z1211 Encounter for screening for malignant neoplasm of colon: Secondary | ICD-10-CM | POA: Diagnosis present

## 2024-02-13 DIAGNOSIS — G4733 Obstructive sleep apnea (adult) (pediatric): Secondary | ICD-10-CM | POA: Diagnosis not present

## 2024-02-13 DIAGNOSIS — Z860100 Personal history of colon polyps, unspecified: Secondary | ICD-10-CM

## 2024-02-13 DIAGNOSIS — E119 Type 2 diabetes mellitus without complications: Secondary | ICD-10-CM | POA: Diagnosis not present

## 2024-02-13 DIAGNOSIS — I10 Essential (primary) hypertension: Secondary | ICD-10-CM | POA: Diagnosis not present

## 2024-02-13 MED ORDER — SODIUM CHLORIDE 0.9 % IV SOLN
500.0000 mL | Freq: Once | INTRAVENOUS | Status: DC
Start: 2024-02-13 — End: 2024-02-13

## 2024-02-13 NOTE — Progress Notes (Signed)
History and Physical:  This patient presents for endoscopic testing for: Encounter Diagnosis  Name Primary?   Hx of colonic polyps Yes    Surveillance colonoscopy - 5mm TSA in Jan 2022 Patient denies chronic abdominal pain, rectal bleeding, constipation or diarrhea.   Patient is otherwise without complaints or active issues today.   Past Medical History: Past Medical History:  Diagnosis Date   Arthritis    Diabetes mellitus    Esophageal stricture    Foreign body in subcutaneous tissue    metal off of a machine, epigastric abd. area   GERD (gastroesophageal reflux disease)    Hyperlipidemia    Hypertension    Kidney stones    Obstructive sleep apnea 05/28/2013   NO cpap use per pt   Sleep apnea      Past Surgical History: Past Surgical History:  Procedure Laterality Date   COLONOSCOPY  10/14/2010   [facility]   DRUG INDUCED ENDOSCOPY N/A 03/31/2021   Procedure: DRUG INDUCED ENDOSCOPY;  Surgeon: Christia Reading, MD;  Location: [facility];  Service: ENT;  Laterality: N/A;   ESOPHAGUS SURGERY  2010   had esophagus stretched   IMPLANTATION OF HYPOGLOSSAL NERVE STIMULATOR Right 05/12/2021   Procedure: IMPLANTATION OF HYPOGLOSSAL NERVE STIMULATOR;  Surgeon: Christia Reading, MD;  Location: [facility];  Service: ENT;  Laterality: Right;   KIDNEY STONE SURGERY  1979   neg hx     UPPER GASTROINTESTINAL ENDOSCOPY  04/22/2013   [facility]    Allergies: Allergies  Allergen Reactions   Alpha-Gal Other (See Comments)   Cetuximab Other (See Comments)    Pt denies      Outpatient Meds: Current Outpatient Medications  Medication Sig Dispense Refill   aspirin 81 MG tablet Take 81 mg by mouth daily.     dapagliflozin propanediol (FARXIGA) 10 MG TABS tablet Take 1 tablet (10 mg total) by mouth daily. 90 tablet 4   glimepiride (AMARYL) 4 MG tablet Take 1 tablet (4 mg total) by mouth daily with breakfast. 90 tablet 3   glucosamine-chondroitin 500-400 MG tablet  Take 1 tablet by mouth 3 (three) times daily.     lisinopril-hydrochlorothiazide (ZESTORETIC) 10-12.5 MG tablet Take 1 tablet by mouth daily. 90 tablet 3   meloxicam (MOBIC) 15 MG tablet Take 1 tablet (15 mg total) by mouth daily. 90 tablet 3   Multiple Vitamin (MULTIVITAMIN) tablet Take 1 tablet by mouth daily.     Omega-3 Fatty Acids (FISH OIL) 600 MG CAPS Take 3,000 mg by mouth daily.     omeprazole (PRILOSEC) 40 MG capsule Take 1 capsule (40 mg total) by mouth daily. 90 capsule 3   simvastatin (ZOCOR) 40 MG tablet Take 1 tablet (40 mg total) by mouth every evening. 90 tablet 3   sitaGLIPtin-metformin (JANUMET) 50-1000 MG tablet Take 1 tablet by mouth 2 (two) times daily with a meal. 180 tablet 4   sildenafil (REVATIO) 20 MG tablet TAKE 1 TO 3 TABLETS BY MOUTH AS NEEDED 30 tablet 2   Current Facility-Administered Medications  Medication Dose Route Frequency Provider Last Rate Last Admin   0.9 %  sodium chloride infusion  500 mL Intravenous Once Sherrilyn Rist, MD          ___________________________________________________________________ Objective   Exam:  BP 114/64   Pulse 71   Temp 98.2 F (36.8 C)   Ht 5\' 5"  (1.651 m)   Wt 185 lb (83.9 kg)   SpO2 96%   BMI 30.79  kg/m   CV: regular , S1/S2 Resp: clear to auscultation bilaterally, normal RR and effort noted GI: soft, no tenderness, with active bowel sounds.   Assessment: Encounter Diagnosis  Name Primary?   Hx of colonic polyps Yes     Plan: Colonoscopy   The benefits and risks of the planned procedure were described in detail with the patient or (when appropriate) their health care proxy.  Risks were outlined as including, but not limited to, bleeding, infection, perforation, adverse medication reaction leading to cardiac or pulmonary decompensation, pancreatitis (if ERCP).  The limitation of incomplete mucosal visualization was also discussed.  No guarantees or warranties were given.  The patient is  appropriate for an endoscopic procedure in the ambulatory setting.   - Amada Jupiter, MD

## 2024-02-13 NOTE — Op Note (Signed)
[facility] Patient Name: Austin Ortiz Procedure Date: 02/13/2024 8:33 AM MRN: [id number] Endoscopist: Sherilyn Cooter L. Myrtie Neither , MD, [id number] Age: 73 Referring MD:  Date of Birth: 24-Nov-1951 Gender: Male Account #: [id number] Procedure:                Colonoscopy Indications:              Surveillance: Personal history of adenomatous                            polyps on last colonoscopy 3 years ago                           5mm TSA Jan 2022                           no SSP or TA in 2011 Medicines:                Monitored Anesthesia Care Procedure:                Pre-Anesthesia Assessment:                           - Prior to the procedure, a History and Physical                            was performed, and patient medications and                            allergies were reviewed. The patient's tolerance of                            previous anesthesia was also reviewed. The risks                            and benefits of the procedure and the sedation                            options and risks were discussed with the patient.                            All questions were answered, and informed consent                            was obtained. Prior Anticoagulants: The patient has                            taken no anticoagulant or antiplatelet agents. ASA                            Grade Assessment: II - A patient with mild systemic                            disease. After reviewing the risks and benefits,  the patient was deemed in satisfactory condition to                            undergo the procedure.                           After obtaining informed consent, the colonoscope                            was passed under direct vision. Throughout the                            procedure, the patient's blood pressure, pulse, and                            oxygen saturations were monitored continuously. The                            Olympus  CF-HQ190L ([id number]) Colonoscope was                            introduced through the anus and advanced to the the                            cecum, identified by appendiceal orifice and                            ileocecal valve. The colonoscopy was performed                            without difficulty. The patient tolerated the                            procedure well. The quality of the bowel                            preparation was excellent. The ileocecal valve,                            appendiceal orifice, and rectum were photographed. Scope In: 8:46:24 AM Scope Out: 8:58:06 AM Scope Withdrawal Time: 0 hours 9 minutes 7 seconds  Total Procedure Duration: 0 hours 11 minutes 42 seconds  Findings:                 The perianal and digital rectal examinations were                            normal.                           Repeat examination of right colon under NBI                            performed.  The exam was otherwise without abnormality on                            direct and retroflexion views. Complications:            No immediate complications. Estimated Blood Loss:     Estimated blood loss: none. Impression:               - The examination was otherwise normal on direct                            and retroflexion views.                           - No specimens collected. Recommendation:           - Patient has a contact number available for                            emergencies. The signs and symptoms of potential                            delayed complications were discussed with the                            patient. Return to normal activities tomorrow.                            Written discharge instructions were provided to the                            patient.                           - Resume previous diet.                           - Continue present medications.                           - No repeat surveillance  colonoscopy recommended                            due to age, current guidelines and low risk                            findings today. Austin Ortiz L. Myrtie Neither, MD 02/13/2024 9:05:18 AM This report has been signed electronically.

## 2024-02-13 NOTE — Progress Notes (Signed)
 Pt's states no medical or surgical changes since previsit or office visit.

## 2024-02-13 NOTE — Progress Notes (Signed)
 Sedate, gd SR, tolerated procedure well, VSS, report to RN

## 2024-02-13 NOTE — Patient Instructions (Signed)

## 2024-02-14 ENCOUNTER — Telehealth: Payer: Self-pay

## 2024-02-14 NOTE — Telephone Encounter (Signed)
  Follow up Call-     02/13/2024    7:56 AM  Call back number  Post procedure Call Back phone  # [phone]  Permission to leave phone message Yes     Patient questions:  Do you have a fever, pain , or abdominal swelling? No. Pain Score  0 *  Have you tolerated food without any problems? Yes.    Have you been able to return to your normal activities? Yes.    Do you have any questions about your discharge instructions: Diet   No. Medications  No. Follow up visit  No.  Do you have questions or concerns about your Care? No.  Actions: * If pain score is 4 or above: No action needed, pain <4.

## 2024-03-01 ENCOUNTER — Other Ambulatory Visit: Payer: Self-pay | Admitting: Family Medicine

## 2024-03-01 DIAGNOSIS — K2100 Gastro-esophageal reflux disease with esophagitis, without bleeding: Secondary | ICD-10-CM

## 2024-03-12 ENCOUNTER — Ambulatory Visit (INDEPENDENT_AMBULATORY_CARE_PROVIDER_SITE_OTHER): Payer: Medicare HMO

## 2024-03-12 VITALS — Ht 65.0 in | Wt 185.0 lb

## 2024-03-12 DIAGNOSIS — Z0000 Encounter for general adult medical examination without abnormal findings: Secondary | ICD-10-CM

## 2024-03-12 NOTE — Progress Notes (Addendum)
 Subjective:   Austin Ortiz is a 73 y.o. who presents for a Medicare Wellness preventive visit.  Visit Complete: Virtual I connected with  Austin Ortiz on 03/12/24 by a audio enabled telemedicine application and verified that I am speaking with the correct person using two identifiers.  Patient Location: Home  Provider Location: Home Office  I discussed the limitations of evaluation and management by telemedicine. The patient expressed understanding and agreed to proceed.  Vital Signs: Because this visit was a virtual/telehealth visit, some criteria may be missing or patient reported. Any vitals not documented were not able to be obtained and vitals that have been documented are patient reported.  VideoDeclined- This patient declined Librarian, academic. Therefore the visit was completed with audio only.  Persons Participating in Visit: Patient.  AWV Questionnaire: No: Patient Medicare AWV questionnaire was not completed prior to this visit.  Cardiac Risk Factors include: advanced age (>69men, >75 women);diabetes mellitus;dyslipidemia;hypertension;male gender     Objective:    Today's Vitals   03/12/24 0935  Weight: 185 lb (83.9 kg)  Height: 5\' 5"  (1.651 m)   Body mass index is 30.79 kg/m.     03/12/2024    9:27 AM 03/08/2023    7:12 PM 03/04/2022    3:52 PM 09/16/2021    8:14 PM 05/12/2021    8:22 AM 05/03/2021    3:41 PM 03/03/2021    4:12 PM  Advanced Directives  Does Patient Have a Medical Advance Directive? No Yes No No No No No  Type of Advance Directive  Living will;Healthcare Power of Attorney       Does patient want to make changes to medical advance directive?  No - Patient declined       Copy of Healthcare Power of Attorney in Chart?  No - copy requested    No - copy requested   Would patient like information on creating a medical advance directive? Yes (MAU/Ambulatory/Procedural Areas - Information given)  No - Patient declined Yes  (MAU/Ambulatory/Procedural Areas - Information given) No - Patient declined  No - Patient declined    Current Medications (verified) Outpatient Encounter Medications as of 03/12/2024  Medication Sig   aspirin 81 MG tablet Take 81 mg by mouth daily.   dapagliflozin propanediol (FARXIGA) 10 MG TABS tablet Take 1 tablet (10 mg total) by mouth daily.   glimepiride (AMARYL) 4 MG tablet Take 1 tablet (4 mg total) by mouth daily with breakfast.   glucosamine-chondroitin 500-400 MG tablet Take 1 tablet by mouth 3 (three) times daily.   lisinopril-hydrochlorothiazide (ZESTORETIC) 10-12.5 MG tablet Take 1 tablet by mouth daily.   meloxicam (MOBIC) 15 MG tablet Take 1 tablet (15 mg total) by mouth daily.   Multiple Vitamin (MULTIVITAMIN) tablet Take 1 tablet by mouth daily.   Omega-3 Fatty Acids (FISH OIL) 600 MG CAPS Take 3,000 mg by mouth daily.   omeprazole (PRILOSEC) 40 MG capsule Take 1 capsule by mouth once daily   sildenafil (REVATIO) 20 MG tablet TAKE 1 TO 3 TABLETS BY MOUTH AS NEEDED   simvastatin (ZOCOR) 40 MG tablet Take 1 tablet (40 mg total) by mouth every evening.   sitaGLIPtin-metformin (JANUMET) 50-1000 MG tablet Take 1 tablet by mouth 2 (two) times daily with a meal.   No facility-administered encounter medications on file as of 03/12/2024.    Allergies (verified) Alpha-gal and Cetuximab   History: Past Medical History:  Diagnosis Date   Arthritis    Diabetes mellitus  Esophageal stricture    Foreign body in subcutaneous tissue    metal off of a machine, epigastric abd. area   GERD (gastroesophageal reflux disease)    Hyperlipidemia    Hypertension    Kidney stones    Obstructive sleep apnea 05/28/2013   NO cpap use per pt   Sleep apnea    Past Surgical History:  Procedure Laterality Date   COLONOSCOPY  10/14/2010   [facility]   DRUG INDUCED ENDOSCOPY N/A 03/31/2021   Procedure: DRUG INDUCED ENDOSCOPY;  Surgeon: Christia Reading, MD;  Location: [facility];   Service: ENT;  Laterality: N/A;   ESOPHAGUS SURGERY  2010   had esophagus stretched   IMPLANTATION OF HYPOGLOSSAL NERVE STIMULATOR Right 05/12/2021   Procedure: IMPLANTATION OF HYPOGLOSSAL NERVE STIMULATOR;  Surgeon: Christia Reading, MD;  Location:  SURGERY CENTER;  Service: ENT;  Laterality: Right;   KIDNEY STONE SURGERY  1979   neg hx     UPPER GASTROINTESTINAL ENDOSCOPY  04/22/2013   [facility]   Family History  Problem Relation Age of Onset   Heart disease Mother    Hypertension Mother    Diabetes type II Paternal Aunt        x2   Nephrolithiasis Maternal Aunt    Breast cancer Maternal Aunt    Heart attack Father        x 2   Diabetes Maternal Aunt    Diabetes Cousin        mat. side   Prostate cancer Brother    Colon cancer Neg Hx    Colon polyps Neg Hx    Esophageal cancer Neg Hx    Rectal cancer Neg Hx    Stomach cancer Neg Hx    Social History   Socioeconomic History   Marital status: Married    Spouse name: [name]   Number of children: 3   Years of education: 12   Highest education level: High school graduate  Occupational History   Occupation: self employed- farmer  Tobacco Use   Smoking status: Never   Smokeless tobacco: Never  Vaping Use   Vaping status: Never Used  Substance and Sexual Activity   Alcohol use: No   Drug use: No   Sexual activity: Yes  Other Topics Concern   Not on file  Social History Narrative   Not on file   Social Drivers of Health   Financial Resource Strain: Low Risk  (03/12/2024)   Overall Financial Resource Strain (CARDIA)    Difficulty of Paying Living Expenses: Not hard at all  Food Insecurity: No Food Insecurity (03/12/2024)   Hunger Vital Sign    Worried About Running Out of Food in the Last Year: Never true    Ran Out of Food in the Last Year: Never true  Transportation Needs: No Transportation Needs (03/12/2024)   PRAPARE - Administrator, Civil Service (Medical): No    Lack of Transportation  (Non-Medical): No  Physical Activity: Insufficiently Active (03/12/2024)   Exercise Vital Sign    Days of Exercise per Week: 3 days    Minutes of Exercise per Session: 30 min  Stress: No Stress Concern Present (03/12/2024)   [employer] of Occupational Health - Occupational Stress Questionnaire    Feeling of Stress : Not at all  Social Connections: Socially Integrated (03/12/2024)   Social Connection and Isolation Panel [NHANES]    Frequency of Communication with Friends and Family: More than three times a week  Frequency of Social Gatherings with Friends and Family: Three times a week    Attends Religious Services: More than 4 times per year    Active Member of Clubs or Organizations: Yes    Attends Engineer, structural: More than 4 times per year    Marital Status: Married    Tobacco Counseling Counseling given: Not Answered    Clinical Intake:  Pre-visit preparation completed: Yes  Pain : No/denies pain     Diabetes: Yes CBG done?: No Did pt. bring in CBG monitor from home?: No  How often do you need to have someone help you when you read instructions, pamphlets, or other written materials from your doctor or pharmacy?: 1 - Never  Interpreter Needed?: No  Information entered by :: Kandis Fantasia LPN   Activities of Daily Living     03/12/2024    9:26 AM  In your present state of health, do you have any difficulty performing the following activities:  Hearing? 0  Vision? 0  Difficulty concentrating or making decisions? 0  Walking or climbing stairs? 0  Dressing or bathing? 0  Doing errands, shopping? 0  Preparing Food and eating ? N  Using the Toilet? N  In the past six months, have you accidently leaked urine? N  Do you have problems with loss of bowel control? N  Managing your Medications? N  Managing your Finances? N  Housekeeping or managing your Housekeeping? N    Patient Care Team: Dettinger, Elige Radon, MD as PCP - General (Family  Medicine) Danella Maiers, [facility] as Pharmacist (Family Medicine) Alanda Slim, MD as Consulting Physician (Sleep Medicine)  Indicate any recent Medical Services you may have received from other than [facility] providers in the past year (date may be approximate).     Assessment:   This is a routine wellness examination for [place].  Hearing/Vision screen Hearing Screening - Comments:: Denies hearing difficulties   Vision Screening - Comments:: Wears rx glasses - up to date with routine eye exams with in office diabetic eye exam     Goals Addressed             This Visit's Progress    Remain active and independent   On track      Depression Screen     03/12/2024    9:36 AM 12/28/2023   11:01 AM 09/22/2023    8:31 AM 06/08/2023    8:07 AM 03/08/2023    7:11 PM 12/02/2022    8:19 AM 09/01/2022    8:23 AM  PHQ 2/9 Scores  PHQ - 2 Score 0 0 0 0 0 0 0  PHQ- 9 Score  0    0     Fall Risk     03/12/2024    9:38 AM 12/28/2023   11:01 AM 09/22/2023    8:31 AM 06/08/2023    8:07 AM 03/08/2023    7:10 PM  Fall Risk   Falls in the past year? 0 0 0 0 0  Number falls in past yr: 0    0  Injury with Fall? 0    0  Risk for fall due to : No Fall Risks    No Fall Risks  Follow up Falls prevention discussed;Education provided;Falls evaluation completed    Falls prevention discussed;Education provided;Falls evaluation completed    MEDICARE RISK AT HOME:  Medicare Risk at Home Any stairs in or around the home?: No If so, are there any without handrails?: No  Home free of loose throw rugs in walkways, pet beds, electrical cords, etc?: Yes Adequate lighting in your home to reduce risk of falls?: Yes Life alert?: No Use of a cane, walker or w/c?: No Grab bars in the bathroom?: Yes Shower chair or bench in shower?: No Elevated toilet seat or a handicapped toilet?: Yes  TIMED UP AND GO:  Was the test performed?  No  Cognitive Function: 6CIT completed    07/03/2018   11:05 AM  MMSE - Mini  Mental State Exam  Orientation to time 5  Orientation to Place 4  Registration 3  Attention/ Calculation 4  Recall 3  Language- name 2 objects 2  Language- repeat 1  Language- follow 3 step command 3  Language- read & follow direction 1  Write a sentence 1  Copy design 1  Total score 28        03/12/2024    9:38 AM 03/08/2023    7:13 PM 03/03/2021    4:13 PM 07/05/2019    8:40 AM  6CIT Screen  What Year? 0 points 0 points 0 points 0 points  What month? 0 points 0 points 0 points 0 points  What time? 0 points 0 points 0 points 0 points  Count back from 20 0 points 0 points 0 points 0 points  Months in reverse 0 points 0 points 0 points 0 points  Repeat phrase 0 points 0 points 0 points 2 points  Total Score 0 points 0 points 0 points 2 points    Immunizations Immunization History  Administered Date(s) Administered   Fluad Quad(high Dose 65+) 10/21/2019   Fluad Trivalent(High Dose 65+) 09/22/2023   Influenza, High Dose Seasonal PF 11/27/2017   Influenza,inj,Quad PF,6+ Mos 09/25/2013, 10/01/2016   Influenza-Unspecified 09/26/2015, 11/30/2018, 10/06/2020, 09/27/2021   Moderna Sars-Covid-2 Vaccination 01/16/2020, 02/21/2020, 10/31/2020   Pneumococcal Conjugate-13 12/13/2013   Pneumococcal Polysaccharide-23 07/03/2018   Tdap 03/29/2012, 05/26/2022   Zoster Recombinant(Shingrix) 08/11/2021, 02/17/2022   Zoster, Live 07/15/2015    Screening Tests Health Maintenance  Topic Date Due   COVID-19 Vaccine (4 - 2024-25 season) 08/27/2023   Diabetic kidney evaluation - Urine ACR  12/03/2023   FOOT EXAM  06/07/2024   HEMOGLOBIN A1C  06/26/2024   OPHTHALMOLOGY EXAM  11/12/2024   Diabetic kidney evaluation - eGFR measurement  12/27/2024   Medicare Annual Wellness (AWV)  03/12/2025   DTaP/Tdap/Td (3 - Td or Tdap) 05/26/2032   Pneumonia Vaccine 91+ Years old  Completed   INFLUENZA VACCINE  Completed   Hepatitis C Screening  Completed   Zoster Vaccines- Shingrix  Completed   HPV  VACCINES  Aged Out   Colonoscopy  Discontinued    Health Maintenance  Health Maintenance Due  Topic Date Due   COVID-19 Vaccine (4 - 2024-25 season) 08/27/2023   Diabetic kidney evaluation - Urine ACR  12/03/2023    Additional Screening:  Vision Screening: Recommended annual ophthalmology exams for early detection of glaucoma and other disorders of the eye.  Dental Screening: Recommended annual dental exams for proper oral hygiene  Community Resource Referral / Chronic Care Management: CRR required this visit?  No   CCM required this visit?  No     Plan:     I have personally reviewed and noted the following in the patient's chart:   Medical and social history Use of alcohol, tobacco or illicit drugs  Current medications and supplements including opioid prescriptions. Patient is not currently taking opioid prescriptions. Functional ability and status Nutritional  status Physical activity Advanced directives List of other physicians Hospitalizations, surgeries, and ER visits in previous 12 months Vitals Screenings to include cognitive, depression, and falls Referrals and appointments  In addition, I have reviewed and discussed with patient certain preventive protocols, quality metrics, and best practice recommendations. A written personalized care plan for preventive services as well as general preventive health recommendations were provided to patient.     Kandis Fantasia [place], [place]   1/61/0960   After Visit Summary: (MyChart) Due to this being a telephonic visit, the after visit summary with patients personalized plan was offered to patient via MyChart   Notes: Nothing significant to report at this time.   I have reviewed and agree with the above AWV documentation.   Mary-Margaret Daphine Deutscher, FNP

## 2024-03-12 NOTE — Patient Instructions (Signed)
 [name] , Thank you for taking time to come for your Medicare Wellness Visit. I appreciate your ongoing commitment to your health goals. Please review the following plan we discussed and let me know if I can assist you in the future.   Referrals/Orders/Follow-Ups/Clinician Recommendations: Aim for 30 minutes of exercise or brisk walking, 6-8 glasses of water, and 5 servings of fruits and vegetables each day.  This is a list of the screening recommended for you and due dates:  Health Maintenance  Topic Date Due   COVID-19 Vaccine (4 - 2024-25 season) 08/27/2023   Yearly kidney health urinalysis for diabetes  12/03/2023   Complete foot exam   06/07/2024   Hemoglobin A1C  06/26/2024   Eye exam for diabetics  11/12/2024   Yearly kidney function blood test for diabetes  12/27/2024   Medicare Annual Wellness Visit  03/12/2025   DTaP/Tdap/Td vaccine (3 - Td or Tdap) 05/26/2032   Pneumonia Vaccine  Completed   Flu Shot  Completed   Hepatitis C Screening  Completed   Zoster (Shingles) Vaccine  Completed   HPV Vaccine  Aged Out   Colon Cancer Screening  Discontinued    Advanced directives: (ACP Link)Information on Advanced Care Planning can be found at [facility] Advance Health Care Directives Advance Health Care Directives. [email or web address]   Next Medicare Annual Wellness Visit scheduled for next year: Yes

## 2024-04-03 ENCOUNTER — Ambulatory Visit (INDEPENDENT_AMBULATORY_CARE_PROVIDER_SITE_OTHER): Payer: Medicare HMO | Admitting: Family Medicine

## 2024-04-03 ENCOUNTER — Encounter: Payer: Self-pay | Admitting: Family Medicine

## 2024-04-03 VITALS — BP 124/66 | HR 69 | Ht 65.0 in | Wt 187.0 lb

## 2024-04-03 DIAGNOSIS — K2100 Gastro-esophageal reflux disease with esophagitis, without bleeding: Secondary | ICD-10-CM

## 2024-04-03 DIAGNOSIS — I152 Hypertension secondary to endocrine disorders: Secondary | ICD-10-CM | POA: Diagnosis not present

## 2024-04-03 DIAGNOSIS — E1169 Type 2 diabetes mellitus with other specified complication: Secondary | ICD-10-CM | POA: Diagnosis not present

## 2024-04-03 DIAGNOSIS — E785 Hyperlipidemia, unspecified: Secondary | ICD-10-CM | POA: Diagnosis not present

## 2024-04-03 DIAGNOSIS — Z7984 Long term (current) use of oral hypoglycemic drugs: Secondary | ICD-10-CM

## 2024-04-03 DIAGNOSIS — E1159 Type 2 diabetes mellitus with other circulatory complications: Secondary | ICD-10-CM | POA: Diagnosis not present

## 2024-04-03 LAB — BAYER DCA HB A1C WAIVED: HB A1C (BAYER DCA - WAIVED): 6.9 % — ABNORMAL HIGH (ref 4.8–5.6)

## 2024-04-03 MED ORDER — SIMVASTATIN 40 MG PO TABS: 40.0000 mg | ORAL_TABLET | Freq: Every evening | ORAL | 3 refills | Status: AC

## 2024-04-03 MED ORDER — MELOXICAM 15 MG PO TABS: 15.0000 mg | ORAL_TABLET | Freq: Every day | ORAL | 3 refills | Status: AC

## 2024-04-03 MED ORDER — OMEPRAZOLE 40 MG PO CPDR
40.0000 mg | DELAYED_RELEASE_CAPSULE | Freq: Every day | ORAL | 3 refills | Status: AC
Start: 2024-04-03 — End: ?

## 2024-04-03 MED ORDER — LISINOPRIL-HYDROCHLOROTHIAZIDE 10-12.5 MG PO TABS: 1.0000 | ORAL_TABLET | Freq: Every day | ORAL | 3 refills | Status: AC

## 2024-04-03 MED ORDER — GLIMEPIRIDE 4 MG PO TABS: 4.0000 mg | ORAL_TABLET | Freq: Every day | ORAL | 3 refills | Status: AC

## 2024-04-03 MED ORDER — JANUMET 50-1000 MG PO TABS: 1.0000 | ORAL_TABLET | Freq: Two times a day (BID) | ORAL | 3 refills | Status: AC

## 2024-04-03 NOTE — Progress Notes (Signed)
 BP 124/66   Pulse 69   Ht 5\' 5"  (1.651 m)   Wt 187 lb (84.8 kg)   SpO2 98%   BMI 31.12 kg/m    Subjective:   Patient ID: Austin Ortiz, male    DOB: 1951/02/01, 73 y.o.   MRN: [id number]  HPI: Austin Ortiz is a 73 y.o. male presenting on 04/03/2024 for Medical Management of Chronic Issues and Diabetes   HPI Type 2 diabetes mellitus Patient comes in today for recheck of his diabetes. Patient has been currently taking glimepiride and Janumet and [place]. Patient is currently on an ACE inhibitor/ARB. Patient has not seen an ophthalmologist this year. Patient denies any new issues with their feet. The symptom started onset as an adult hypertension and hyperlipidemia ARE RELATED TO DM   Hypertension Patient is currently on lisinopril-hydrochlorothiazide, and their blood pressure today is 124/66. Patient denies any lightheadedness or dizziness. Patient denies headaches, blurred vision, chest pains, shortness of breath, or weakness. Denies any side effects from medication and is content with current medication.   Hyperlipidemia Patient is coming in for recheck of his hyperlipidemia. The patient is currently taking simvastatin. They deny any issues with myalgias or history of liver damage from it. They deny any focal numbness or weakness or chest pain.   Relevant past medical, surgical, family and social history reviewed and updated as indicated. Interim medical history since our last visit reviewed. Allergies and medications reviewed and updated.  Review of Systems  Constitutional:  Negative for chills and fever.  Eyes:  Negative for visual disturbance.  Respiratory:  Negative for shortness of breath and wheezing.   Cardiovascular:  Negative for chest pain and leg swelling.  Musculoskeletal:  Negative for back pain and gait problem.  Skin:  Negative for rash.  Neurological:  Negative for dizziness and light-headedness.  All other systems reviewed and are negative.   Per HPI  unless specifically indicated above   Allergies as of 04/03/2024       Reactions   Alpha-gal Other (See Comments)   Cetuximab Other (See Comments)   Pt denies         Medication List        Accurate as of April 03, 2024 10:03 AM. If you have any questions, ask your nurse or doctor.          aspirin 81 MG tablet Take 81 mg by mouth daily.   dapagliflozin propanediol 10 MG Tabs tablet Commonly known as: Farxiga Take 1 tablet (10 mg total) by mouth daily.   Fish Oil 600 MG Caps Take 3,000 mg by mouth daily.   glimepiride 4 MG tablet Commonly known as: AMARYL Take 1 tablet (4 mg total) by mouth daily with breakfast.   glucosamine-chondroitin 500-400 MG tablet Take 1 tablet by mouth 3 (three) times daily.   Janumet 50-1000 MG tablet Generic drug: sitaGLIPtin-metformin Take 1 tablet by mouth 2 (two) times daily with a meal.   lisinopril-hydrochlorothiazide 10-12.5 MG tablet Commonly known as: ZESTORETIC Take 1 tablet by mouth daily.   meloxicam 15 MG tablet Commonly known as: MOBIC Take 1 tablet (15 mg total) by mouth daily.   multivitamin tablet Take 1 tablet by mouth daily.   omeprazole 40 MG capsule Commonly known as: PRILOSEC Take 1 capsule (40 mg total) by mouth daily.   sildenafil 20 MG tablet Commonly known as: REVATIO TAKE 1 TO 3 TABLETS BY MOUTH AS NEEDED   simvastatin 40 MG tablet Commonly known as: ZOCOR  Take 1 tablet (40 mg total) by mouth every evening.         Objective:   BP 124/66   Pulse 69   Ht 5\' 5"  (1.651 m)   Wt 187 lb (84.8 kg)   SpO2 98%   BMI 31.12 kg/m   Wt Readings from Last 3 Encounters:  04/03/24 187 lb (84.8 kg)  03/12/24 185 lb (83.9 kg)  02/13/24 185 lb (83.9 kg)    Physical Exam Vitals and nursing note reviewed.  Constitutional:      General: He is not in acute distress.    Appearance: He is well-developed. He is not diaphoretic.  Eyes:     General: No scleral icterus.    Conjunctiva/sclera:  Conjunctivae normal.  Neck:     Thyroid: No thyromegaly.  Cardiovascular:     Rate and Rhythm: Normal rate and regular rhythm.     Heart sounds: Normal heart sounds. No murmur heard. Pulmonary:     Effort: Pulmonary effort is normal. No respiratory distress.     Breath sounds: Normal breath sounds. No wheezing.  Musculoskeletal:        General: No swelling. Normal range of motion.     Cervical back: Neck supple.  Lymphadenopathy:     Cervical: No cervical adenopathy.  Skin:    General: Skin is warm and dry.     Findings: No rash.  Neurological:     Mental Status: He is alert and oriented to person, place, and time.     Coordination: Coordination normal.  Psychiatric:        Behavior: Behavior normal.       Assessment & Plan:   Problem List Items Addressed This Visit       Cardiovascular and Mediastinum   Hypertension associated with diabetes (HCC)   Relevant Medications   glimepiride (AMARYL) 4 MG tablet   lisinopril-hydrochlorothiazide (ZESTORETIC) 10-12.5 MG tablet   simvastatin (ZOCOR) 40 MG tablet   sitaGLIPtin-metformin (JANUMET) 50-1000 MG tablet     Digestive   GERD (gastroesophageal reflux disease)   Relevant Medications   omeprazole (PRILOSEC) 40 MG capsule     Endocrine   T2DM (type 2 diabetes mellitus) (HCC) - Primary   Relevant Medications   glimepiride (AMARYL) 4 MG tablet   lisinopril-hydrochlorothiazide (ZESTORETIC) 10-12.5 MG tablet   simvastatin (ZOCOR) 40 MG tablet   sitaGLIPtin-metformin (JANUMET) 50-1000 MG tablet   Other Relevant Orders   Bayer DCA Hb A1c Waived   Microalbumin/Creatinine Ratio, Urine   Hyperlipidemia associated with type 2 diabetes mellitus (HCC)   Relevant Medications   glimepiride (AMARYL) 4 MG tablet   lisinopril-hydrochlorothiazide (ZESTORETIC) 10-12.5 MG tablet   simvastatin (ZOCOR) 40 MG tablet   sitaGLIPtin-metformin (JANUMET) 50-1000 MG tablet    A1c is 6.9.  Looks good, blood pressure looks good. Follow up  plan: Return in about 3 months (around 07/03/2024), or if symptoms worsen or fail to improve, for Diabetes.  Counseling provided for all of the vaccine components Orders Placed This Encounter  Procedures   Bayer DCA Hb A1c Waived   Microalbumin/Creatinine Ratio, Urine    Arville Care, MD [facility] 04/03/2024, 10:03 AM

## 2024-04-04 LAB — MICROALBUMIN / CREATININE URINE RATIO
Creatinine, Urine: 53.5 mg/dL
Microalb/Creat Ratio: 26 mg/g{creat} (ref 0–29)
Microalbumin, Urine: 13.9 ug/mL

## 2024-04-05 ENCOUNTER — Encounter: Payer: Self-pay | Admitting: Family Medicine

## 2024-05-15 DIAGNOSIS — Z4542 Encounter for adjustment and management of neuropacemaker (brain) (peripheral nerve) (spinal cord): Secondary | ICD-10-CM | POA: Diagnosis not present

## 2024-05-15 DIAGNOSIS — G4733 Obstructive sleep apnea (adult) (pediatric): Secondary | ICD-10-CM | POA: Diagnosis not present

## 2024-05-21 ENCOUNTER — Other Ambulatory Visit: Payer: Self-pay

## 2024-05-21 ENCOUNTER — Telehealth: Payer: Self-pay | Admitting: Family Medicine

## 2024-05-21 ENCOUNTER — Encounter (HOSPITAL_COMMUNITY): Payer: Self-pay

## 2024-05-21 ENCOUNTER — Ambulatory Visit (INDEPENDENT_AMBULATORY_CARE_PROVIDER_SITE_OTHER): Admitting: Family Medicine

## 2024-05-21 ENCOUNTER — Observation Stay (HOSPITAL_COMMUNITY)
Admission: EM | Admit: 2024-05-21 | Discharge: 2024-05-23 | Disposition: A | Discharge status: Home / Self Care | Attending: Internal Medicine | Admitting: Internal Medicine

## 2024-05-21 ENCOUNTER — Ambulatory Visit (HOSPITAL_COMMUNITY)
Admission: RE | Admit: 2024-05-21 | Discharge: 2024-05-21 | Disposition: A | Discharge status: Home / Self Care | Source: Ambulatory Visit | Attending: Family Medicine

## 2024-05-21 ENCOUNTER — Encounter: Payer: Self-pay | Admitting: Family Medicine

## 2024-05-21 VITALS — BP 137/81 | HR 80 | Temp 97.0°F | Ht 65.0 in | Wt 186.4 lb

## 2024-05-21 DIAGNOSIS — E119 Type 2 diabetes mellitus without complications: Secondary | ICD-10-CM | POA: Insufficient documentation

## 2024-05-21 DIAGNOSIS — K828 Other specified diseases of gallbladder: Secondary | ICD-10-CM | POA: Diagnosis not present

## 2024-05-21 DIAGNOSIS — Z7982 Long term (current) use of aspirin: Secondary | ICD-10-CM | POA: Insufficient documentation

## 2024-05-21 DIAGNOSIS — E1159 Type 2 diabetes mellitus with other circulatory complications: Secondary | ICD-10-CM | POA: Diagnosis present

## 2024-05-21 DIAGNOSIS — N200 Calculus of kidney: Secondary | ICD-10-CM | POA: Diagnosis not present

## 2024-05-21 DIAGNOSIS — I10 Essential (primary) hypertension: Secondary | ICD-10-CM | POA: Insufficient documentation

## 2024-05-21 DIAGNOSIS — R82998 Other abnormal findings in urine: Secondary | ICD-10-CM | POA: Diagnosis not present

## 2024-05-21 DIAGNOSIS — R1031 Right lower quadrant pain: Secondary | ICD-10-CM | POA: Diagnosis not present

## 2024-05-21 DIAGNOSIS — Z7984 Long term (current) use of oral hypoglycemic drugs: Secondary | ICD-10-CM | POA: Insufficient documentation

## 2024-05-21 DIAGNOSIS — K8012 Calculus of gallbladder with acute and chronic cholecystitis without obstruction: Secondary | ICD-10-CM | POA: Diagnosis not present

## 2024-05-21 DIAGNOSIS — E785 Hyperlipidemia, unspecified: Secondary | ICD-10-CM | POA: Diagnosis not present

## 2024-05-21 DIAGNOSIS — E669 Obesity, unspecified: Secondary | ICD-10-CM | POA: Insufficient documentation

## 2024-05-21 DIAGNOSIS — Z79899 Other long term (current) drug therapy: Secondary | ICD-10-CM | POA: Diagnosis not present

## 2024-05-21 DIAGNOSIS — R109 Unspecified abdominal pain: Secondary | ICD-10-CM | POA: Diagnosis present

## 2024-05-21 DIAGNOSIS — I152 Hypertension secondary to endocrine disorders: Secondary | ICD-10-CM | POA: Diagnosis present

## 2024-05-21 DIAGNOSIS — G4733 Obstructive sleep apnea (adult) (pediatric): Secondary | ICD-10-CM | POA: Diagnosis not present

## 2024-05-21 DIAGNOSIS — K429 Umbilical hernia without obstruction or gangrene: Secondary | ICD-10-CM | POA: Diagnosis not present

## 2024-05-21 DIAGNOSIS — E1169 Type 2 diabetes mellitus with other specified complication: Secondary | ICD-10-CM | POA: Diagnosis present

## 2024-05-21 DIAGNOSIS — K819 Cholecystitis, unspecified: Principal | ICD-10-CM | POA: Diagnosis present

## 2024-05-21 DIAGNOSIS — K810 Acute cholecystitis: Secondary | ICD-10-CM | POA: Diagnosis present

## 2024-05-21 DIAGNOSIS — K449 Diaphragmatic hernia without obstruction or gangrene: Secondary | ICD-10-CM | POA: Diagnosis not present

## 2024-05-21 LAB — CBC WITH DIFFERENTIAL/PLATELET
Abs Immature Granulocytes: 0.1 10*3/uL — ABNORMAL HIGH (ref 0.00–0.07)
Basophils Absolute: 0 10*3/uL (ref 0.0–0.1)
Basophils Relative: 0 %
Eosinophils Absolute: 0 10*3/uL (ref 0.0–0.5)
Eosinophils Relative: 0 %
HCT: 40.7 % (ref 39.0–52.0)
Hemoglobin: 13.4 g/dL (ref 13.0–17.0)
Immature Granulocytes: 1 %
Lymphocytes Relative: 14 %
Lymphs Abs: 2.2 10*3/uL (ref 0.7–4.0)
MCH: 28.8 pg (ref 26.0–34.0)
MCHC: 32.9 g/dL (ref 30.0–36.0)
MCV: 87.5 fL (ref 80.0–100.0)
Monocytes Absolute: 1.3 10*3/uL — ABNORMAL HIGH (ref 0.1–1.0)
Monocytes Relative: 8 %
Neutro Abs: 12.3 10*3/uL — ABNORMAL HIGH (ref 1.7–7.7)
Neutrophils Relative %: 77 %
Platelets: 252 10*3/uL (ref 150–400)
RBC: 4.65 MIL/uL (ref 4.22–5.81)
RDW: 14.2 % (ref 11.5–15.5)
WBC: 16 10*3/uL — ABNORMAL HIGH (ref 4.0–10.5)
nRBC: 0 % (ref 0.0–0.2)

## 2024-05-21 LAB — COMPREHENSIVE METABOLIC PANEL WITH GFR
ALT: 27 U/L (ref 0–44)
AST: 25 U/L (ref 15–41)
Albumin: 3.1 g/dL — ABNORMAL LOW (ref 3.5–5.0)
Alkaline Phosphatase: 48 U/L (ref 38–126)
Anion gap: 11 (ref 5–15)
BUN: 24 mg/dL — ABNORMAL HIGH (ref 8–23)
CO2: 23 mmol/L (ref 22–32)
Calcium: 8.9 mg/dL (ref 8.9–10.3)
Chloride: 100 mmol/L (ref 98–111)
Creatinine, Ser: 0.87 mg/dL (ref 0.61–1.24)
GFR, Estimated: >60 mL/min (ref >60)
Glucose, Bld: 203 mg/dL — ABNORMAL HIGH (ref 70–99)
Potassium: 3.9 mmol/L (ref 3.5–5.1)
Sodium: 134 mmol/L — ABNORMAL LOW (ref 135–145)
Total Bilirubin: 0.7 mg/dL (ref 0.0–1.2)
Total Protein: 7.1 g/dL (ref 6.5–8.1)

## 2024-05-21 LAB — URINALYSIS, ROUTINE W REFLEX MICROSCOPIC
Bilirubin, UA: NEGATIVE
Leukocytes,UA: NEGATIVE
Nitrite, UA: NEGATIVE
Specific Gravity, UA: 1.015 (ref 1.005–1.030)
Urobilinogen, Ur: 0.2 mg/dL (ref 0.2–1.0)
pH, UA: 5.5 (ref 5.0–7.5)

## 2024-05-21 LAB — MICROSCOPIC EXAMINATION
Bacteria, UA: NONE SEEN
Renal Epithel, UA: NONE SEEN /HPF
Yeast, UA: NONE SEEN

## 2024-05-21 LAB — POCT I-STAT CREATININE: Creatinine, Ser: 1.1 mg/dL (ref 0.61–1.24)

## 2024-05-21 LAB — LIPASE, BLOOD: Lipase: 31 U/L (ref 11–51)

## 2024-05-21 MED ORDER — SODIUM CHLORIDE 0.9 % IV SOLN
2.0000 g | INTRAVENOUS | Status: DC
  Administered 2024-05-22: 2 g via INTRAVENOUS
  Filled 2024-05-21: qty 20

## 2024-05-21 MED ORDER — ONDANSETRON HCL 4 MG PO TABS: 4.0000 mg | ORAL_TABLET | Freq: Four times a day (QID) | ORAL | Status: DC | PRN

## 2024-05-21 MED ORDER — SENNOSIDES-DOCUSATE SODIUM 8.6-50 MG PO TABS
1.0000 | ORAL_TABLET | Freq: Every evening | ORAL | Status: DC | PRN
Start: 2024-05-21 — End: 2024-05-23

## 2024-05-21 MED ORDER — PIPERACILLIN-TAZOBACTAM 3.375 G IVPB 30 MIN
3.3750 g | Freq: Once | INTRAVENOUS | Status: AC
  Administered 2024-05-21: 3.375 g via INTRAVENOUS
  Filled 2024-05-21: qty 50

## 2024-05-21 MED ORDER — ACETAMINOPHEN 325 MG PO TABS: 650.0000 mg | ORAL_TABLET | Freq: Four times a day (QID) | ORAL | Status: DC | PRN

## 2024-05-21 MED ORDER — BISACODYL 5 MG PO TBEC: 5.0000 mg | DELAYED_RELEASE_TABLET | Freq: Every day | ORAL | Status: DC | PRN

## 2024-05-21 MED ORDER — HYDROMORPHONE HCL 1 MG/ML IJ SOLN
0.5000 mg | Freq: Once | INTRAMUSCULAR | Status: AC
  Administered 2024-05-21: 0.5 mg via INTRAVENOUS
  Filled 2024-05-21: qty 1

## 2024-05-21 MED ORDER — HYDROCODONE-ACETAMINOPHEN 5-325 MG PO TABS
1.0000 | ORAL_TABLET | ORAL | Status: DC | PRN
  Administered 2024-05-22: 1 via ORAL
  Filled 2024-05-21: qty 1

## 2024-05-21 MED ORDER — ACETAMINOPHEN 650 MG RE SUPP: 650.0000 mg | Freq: Four times a day (QID) | RECTAL | Status: DC | PRN

## 2024-05-21 MED ORDER — MORPHINE SULFATE (PF) 2 MG/ML IV SOLN
2.0000 mg | INTRAVENOUS | Status: DC | PRN
  Administered 2024-05-22: 2 mg via INTRAVENOUS
  Filled 2024-05-21: qty 1

## 2024-05-21 MED ORDER — ONDANSETRON HCL 4 MG/2ML IJ SOLN
4.0000 mg | Freq: Once | INTRAMUSCULAR | Status: AC
  Administered 2024-05-21: 4 mg via INTRAVENOUS
  Filled 2024-05-21: qty 2

## 2024-05-21 MED ORDER — IOHEXOL 300 MG/ML  SOLN
100.0000 mL | Freq: Once | INTRAMUSCULAR | Status: AC | PRN
Start: 2024-05-21 — End: 2024-05-21
  Administered 2024-05-21: 100 mL via INTRAVENOUS

## 2024-05-21 MED ORDER — ONDANSETRON HCL 4 MG/2ML IJ SOLN: 4.0000 mg | Freq: Four times a day (QID) | INTRAMUSCULAR | Status: DC | PRN

## 2024-05-21 MED ORDER — INSULIN ASPART 100 UNIT/ML IJ SOLN
0.0000 [IU] | INTRAMUSCULAR | Status: DC
  Administered 2024-05-22: 2 [IU] via SUBCUTANEOUS
  Administered 2024-05-22: 1 [IU] via SUBCUTANEOUS
  Administered 2024-05-22: 3 [IU] via SUBCUTANEOUS
  Administered 2024-05-22 – 2024-05-23 (×3): 2 [IU] via SUBCUTANEOUS
  Filled 2024-05-21: qty 0.09

## 2024-05-21 MED ORDER — LACTATED RINGERS IV SOLN
INTRAVENOUS | Status: AC
Start: 2024-05-21 — End: 2024-05-22

## 2024-05-21 MED ORDER — METRONIDAZOLE 500 MG/100ML IV SOLN
500.0000 mg | Freq: Two times a day (BID) | INTRAVENOUS | Status: DC
  Administered 2024-05-22: 500 mg via INTRAVENOUS
  Filled 2024-05-21: qty 100

## 2024-05-21 NOTE — ED Provider Notes (Signed)
 [place] EMERGENCY DEPARTMENT AT [facility] Provider Note   CSN: [id number] Arrival date & time: 05/21/24  2110     History  Chief Complaint  Patient presents with   Abdominal Pain    Austin Ortiz is a 73 y.o. male.  73 year old male history of diabetes who presents to the emergency department with right-sided abdominal pain.  Patient reports that on Sunday night he was asleep after eating dinner and started feeling some right sided abdominal pain.  Says that initially was a bloating sensation that was generalized and then started migrating to his right side.  Also had a fever of 101 today and saw his primary doctor.  They performed a CT scan that showed possible cholecystitis.  Normal appendix.  Not on blood thinners.  No abdominal surgeries.       Home Medications Prior to Admission medications   Medication Sig Start Date End Date Taking? Authorizing Provider  aspirin 81 MG tablet Take 81 mg by mouth daily.   Yes [provider]  dapagliflozin  propanediol (FARXIGA ) 10 MG TABS tablet Take 1 tablet (10 mg total) by mouth daily. 02/06/24  Yes Dettinger, Lucio Sabin, MD  glimepiride  (AMARYL ) 4 MG tablet Take 1 tablet (4 mg total) by mouth daily with breakfast. 04/03/24  Yes Dettinger, Lucio Sabin, MD  glucosamine-chondroitin 500-400 MG tablet Take 1 tablet by mouth daily.   Yes [provider]  lisinopril -hydrochlorothiazide  (ZESTORETIC ) 10-12.5 MG tablet Take 1 tablet by mouth daily. 04/03/24  Yes Dettinger, Lucio Sabin, MD  meloxicam  (MOBIC ) 15 MG tablet Take 1 tablet (15 mg total) by mouth daily. 04/03/24  Yes Dettinger, Lucio Sabin, MD  Multiple Vitamin (MULTIVITAMIN) tablet Take 1 tablet by mouth daily.   Yes [provider]  Omega-3 Fatty Acids (FISH OIL) 600 MG CAPS Take 1,200 mg by mouth daily.   Yes [provider]  omeprazole  (PRILOSEC) 40 MG capsule Take 1 capsule (40 mg total) by mouth daily. 04/03/24  Yes Dettinger, Lucio Sabin, MD   sildenafil  (REVATIO ) 20 MG tablet TAKE 1 TO 3 TABLETS BY MOUTH AS NEEDED 03/01/24  Yes Dettinger, Lucio Sabin, MD  simvastatin  (ZOCOR ) 40 MG tablet Take 1 tablet (40 mg total) by mouth every evening. 04/03/24  Yes Dettinger, Lucio Sabin, MD  sitaGLIPtin -metformin  (JANUMET ) 50-1000 MG tablet Take 1 tablet by mouth 2 (two) times daily with a meal. 04/03/24  Yes Dettinger, Lucio Sabin, MD      Allergies    Alpha-gal and Cetuximab    Review of Systems   Review of Systems  Physical Exam Updated Vital Signs BP (!) 117/95   Pulse 81   Temp 99.1 F (37.3 C) (Oral)   Resp 16   Ht 5\' 5"  (1.651 m)   Wt 84.5 kg   SpO2 97%   BMI 31.02 kg/m  Physical Exam Vitals and nursing note reviewed.  Constitutional:      General: He is not in acute distress.    Appearance: He is well-developed.  HENT:     Head: Normocephalic and atraumatic.     Right Ear: External ear normal.     Left Ear: External ear normal.     Nose: Nose normal.  Eyes:     Extraocular Movements: Extraocular movements intact.     Conjunctiva/sclera: Conjunctivae normal.     Pupils: Pupils are equal, round, and reactive to light.  Abdominal:     General: There is no distension.     Palpations: Abdomen is soft. There  is no mass.     Tenderness: There is abdominal tenderness (Right upper quadrant.  Positive Murphy sign.  Right lower quadrant tender to palpation as well.). There is no guarding.  Musculoskeletal:     Cervical back: Normal range of motion and neck supple.  Skin:    General: Skin is warm and dry.  Neurological:     Mental Status: He is alert. Mental status is at baseline.  Psychiatric:        Mood and Affect: Mood normal.        Behavior: Behavior normal.     ED Results / Procedures / Treatments   Labs (all labs ordered are listed, but only abnormal results are displayed) Labs Reviewed  COMPREHENSIVE METABOLIC PANEL WITH GFR - Abnormal; Notable for the following components:      Result Value   Sodium 134 (*)     Glucose, Bld 203 (*)    BUN 24 (*)    Albumin 3.1 (*)    All other components within normal limits  CBC WITH DIFFERENTIAL/PLATELET - Abnormal; Notable for the following components:   WBC 16.0 (*)    Neutro Abs 12.3 (*)    Monocytes Absolute 1.3 (*)    Abs Immature Granulocytes 0.10 (*)    All other components within normal limits  LIPASE, BLOOD    EKG None  Radiology CT ABDOMEN PELVIS W CONTRAST Result Date: 05/21/2024 CLINICAL DATA:  Right lower quadrant pain. EXAM: CT ABDOMEN AND PELVIS WITH CONTRAST TECHNIQUE: Multidetector CT imaging of the abdomen and pelvis was performed using the standard protocol following bolus administration of intravenous contrast. RADIATION DOSE REDUCTION: This exam was performed according to the departmental dose-optimization program which includes automated exposure control, adjustment of the mA and/or kV according to patient size and/or use of iterative reconstruction technique. CONTRAST:  100mL OMNIPAQUE IOHEXOL 300 MG/ML  SOLN COMPARISON:  None Available. FINDINGS: Lower Chest: No acute findings. Hepatobiliary: No suspicious hepatic masses identified. The gallbladder is distended and contains a small amount of lesion ring sludge or tiny gallstones. Mild gallbladder wall thickening is seen with hazy inflammatory changes in the pericholecystic fat. These findings are consistent with acute cholecystitis. No No evidence of biliary ductal dilatation. Pancreas:  No mass or inflammatory changes. Spleen: Within normal limits in size and appearance. Adrenals/Urinary Tract: No suspicious masses identified. 8 mm nonobstructing calculus in lower pole of right kidney. No evidence of ureteral calculi or hydronephrosis. Stomach/Bowel: Tiny hiatal hernia. No evidence of obstruction, inflammatory process or abnormal fluid collections. Normal appendix visualized. Vascular/Lymphatic: No pathologically enlarged lymph nodes. No acute vascular findings. Reproductive:  No mass or other  significant abnormality. Other:  Tiny umbilical hernia which contains only fat. Musculoskeletal:  No suspicious bone lesions identified. IMPRESSION: Findings consistent with acute cholecystitis. 8 mm nonobstructing right renal calculus. No evidence of ureteral calculi or hydronephrosis. Tiny hiatal hernia and umbilical hernia. These results will be called to the ordering clinician or representative by the Radiologist Assistant, and communication documented in the PACS or [employer]. Electronically Signed   By: Marlyce Sine M.D.   On: 05/21/2024 17:56    Procedures Procedures    Medications Ordered in ED Medications  insulin aspart (novoLOG) injection 0-9 Units (has no administration in time range)  piperacillin-tazobactam (ZOSYN) IVPB 3.375 g (3.375 g Intravenous New Bag/Given 05/21/24 2253)  HYDROmorphone  (DILAUDID ) injection 0.5 mg (0.5 mg Intravenous Given 05/21/24 2252)  ondansetron  (ZOFRAN ) injection 4 mg (4 mg Intravenous Given 05/21/24 2251)  ED Course/ Medical Decision Making/ A&P Clinical Course as of 05/21/24 2339  Tue May 21, 2024  2152 Dr Lanell Pinta from general surgery recommends admission to hospitalist and they will perform a cholecystectomy tomorrow. [RP]  2316 Dr Lydia Sams from hospitalist consulted.  [RP]    Clinical Course User Index [RP] Ninetta Basket, MD                                 Medical Decision Making Amount and/or Complexity of Data Reviewed Labs: ordered.  Risk Prescription drug management. Decision regarding hospitalization.   BERTRAND VOWELS is a 73 y.o. male with comorbidities that complicate the patient evaluation including diabetes who presents to the emergency department with right-sided abdominal pain  Initial Ddx:  Cholecystitis, appendicitis, kidney stone, colitis  MDM/Course:  Patient presents emergency department right-sided abdominal pain as well as fevers.  Had an outpatient CT scan that shows possible cholecystitis without  evidence of appendicitis.  On exam is tender all over the right side of his abdomen including his right upper quadrant.  He does have a positive Murphy sign.  Lab work shows a white blood cell count of 16.  Started on Zosyn discussed with surgery who will evaluate the patient for cholecystectomy in the morning and likely take to the OR.  Admitted to hospitalist for further management.    This patient presents to the ED for concern of complaints listed in HPI, this involves an extensive number of treatment options, and is a complaint that carries with it a high risk of complications and morbidity. Disposition including potential need for admission considered.   Dispo: Admit to Floor  Additional history obtained from spouse Records reviewed Outpatient Clinic Notes The following labs were independently interpreted: Chemistry and show no acute abnormality I have reviewed the patients home medications and made adjustments as needed Consults: General Surgery Social Determinants of health:  Geriatric  Portions of this note were generated with Scientist, clinical (histocompatibility and immunogenetics). Dictation errors may occur despite best attempts at proofreading.     Final Clinical Impression(s) / ED Diagnoses Final diagnoses:  Cholecystitis    Rx / DC Orders ED Discharge Orders     None         Ninetta Basket, MD 05/21/24 2339

## 2024-05-21 NOTE — ED Triage Notes (Signed)
 Pt has been having right sided abdominal pain with fever x 2 days. Pt went to PMD and had a CT and they called him and told him to come here for cholecystitis.

## 2024-05-21 NOTE — Telephone Encounter (Signed)
 Cholecystitis, acute.  Patient sent to the ER.  See telephone message.  Roise Cleaver MD.

## 2024-05-21 NOTE — Hospital Course (Signed)
 Austin Ortiz is a 73 y.o. male with medical history significant for T2DM, HTN, HLD, OSA who is admitted with acute cholecystitis.

## 2024-05-21 NOTE — Progress Notes (Signed)
 Subjective:  Patient ID: Austin Ortiz, male    DOB: May 01, 1951, 73 y.o.   MRN: [id number]  Patient Care Team: Dettinger, Lucio Sabin, MD as PCP - General (Family Medicine) Delilah Fend, [facility] as Pharmacist (Family Medicine) Woodson He, MD as Consulting Physician (Sleep Medicine)   Chief Complaint:  Nausea, Diarrhea, Fever, and Abdominal Pain (X 2 days)   HPI: Austin Ortiz is a 73 y.o. male presenting on 05/21/2024 for Nausea, Diarrhea, Fever, and Abdominal Pain (X 2 days)  Diarrhea  Associated symptoms include abdominal pain and a fever.  Fever  Associated symptoms include abdominal pain and diarrhea.  Abdominal Pain Associated symptoms include diarrhea and a fever.   1. Dark urine Symptoms started on Sunday with bloating, took gas pill, which helped his symptoms some. Reports that he had nausea with it but never vomited. States that he slept most of the day. He was visiting family in [place] . Woke up 3 am on Monday morning and had diarrhea. Had to drive home and took Kaopectate. Had temperature of 101.1 this morning at home, took tylenol . States that his stomach is sore. Reports that this morning he has darker stool and urine. Denies burning, discharge. Has history of kidney stones, but this feels different. Denies retention, hematuria, hesitancy. Continues to have gas and decreased appetite. Pain is mainly on his right side. Pain sitting 5/10, with 8/10 with pressing on it.  BG this am 161.   Relevant past medical, surgical, family, and social history reviewed and updated as indicated.  Allergies and medications reviewed and updated. Data reviewed: Chart in Epic.   Past Medical History:  Diagnosis Date   Arthritis    Diabetes mellitus    Esophageal stricture    Foreign body in subcutaneous tissue    metal off of a machine, epigastric abd. area   GERD (gastroesophageal reflux disease)    Hyperlipidemia    Hypertension    Kidney stones    Obstructive sleep apnea  05/28/2013   NO cpap use per pt   Sleep apnea     Past Surgical History:  Procedure Laterality Date   COLONOSCOPY  10/14/2010   [facility]   DRUG INDUCED ENDOSCOPY N/A 03/31/2021   Procedure: DRUG INDUCED ENDOSCOPY;  Surgeon: Virgina Grills, MD;  Location: [facility];  Service: ENT;  Laterality: N/A;   ESOPHAGUS SURGERY  2010   had esophagus stretched   IMPLANTATION OF HYPOGLOSSAL NERVE STIMULATOR Right 05/12/2021   Procedure: IMPLANTATION OF HYPOGLOSSAL NERVE STIMULATOR;  Surgeon: Virgina Grills, MD;  Location: [facility];  Service: ENT;  Laterality: Right;   KIDNEY STONE SURGERY  1979   neg hx     UPPER GASTROINTESTINAL ENDOSCOPY  04/22/2013   [facility]    Social History   Socioeconomic History   Marital status: Married    Spouse name: [name]   Number of children: 3   Years of education: 12   Highest education level: High school graduate  Occupational History   Occupation: self employed- farmer  Tobacco Use   Smoking status: Never   Smokeless tobacco: Never  Vaping Use   Vaping status: Never Used  Substance and Sexual Activity   Alcohol use: No   Drug use: No   Sexual activity: Yes  Other Topics Concern   Not on file  Social History Narrative   Not on file   Social Drivers of Health   Financial Resource Strain: Low Risk  (03/12/2024)  Overall Financial Resource Strain (CARDIA)    Difficulty of Paying Living Expenses: Not hard at all  Food Insecurity: No Food Insecurity (03/12/2024)   Hunger Vital Sign    Worried About Running Out of Food in the Last Year: Never true    Ran Out of Food in the Last Year: Never true  Transportation Needs: No Transportation Needs (03/12/2024)   PRAPARE - Administrator, Civil Service (Medical): No    Lack of Transportation (Non-Medical): No  Physical Activity: Insufficiently Active (03/12/2024)   Exercise Vital Sign    Days of Exercise per Week: 3 days    Minutes of Exercise per Session: 30 min   Stress: No Stress Concern Present (03/12/2024)   [employer] of Occupational Health - Occupational Stress Questionnaire    Feeling of Stress : Not at all  Social Connections: Socially Integrated (03/12/2024)   Social Connection and Isolation Panel [NHANES]    Frequency of Communication with Friends and Family: More than three times a week    Frequency of Social Gatherings with Friends and Family: Three times a week    Attends Religious Services: More than 4 times per year    Active Member of Clubs or Organizations: Yes    Attends Banker Meetings: More than 4 times per year    Marital Status: Married  Catering manager Violence: Not At Risk (03/12/2024)   Humiliation, Afraid, Rape, and Kick questionnaire    Fear of Current or Ex-Partner: No    Emotionally Abused: No    Physically Abused: No    Sexually Abused: No    Outpatient Encounter Medications as of 05/21/2024  Medication Sig   aspirin 81 MG tablet Take 81 mg by mouth daily.   dapagliflozin  propanediol (FARXIGA ) 10 MG TABS tablet Take 1 tablet (10 mg total) by mouth daily.   glimepiride  (AMARYL ) 4 MG tablet Take 1 tablet (4 mg total) by mouth daily with breakfast.   glucosamine-chondroitin 500-400 MG tablet Take 1 tablet by mouth 3 (three) times daily.   lisinopril -hydrochlorothiazide  (ZESTORETIC ) 10-12.5 MG tablet Take 1 tablet by mouth daily.   meloxicam  (MOBIC ) 15 MG tablet Take 1 tablet (15 mg total) by mouth daily.   Multiple Vitamin (MULTIVITAMIN) tablet Take 1 tablet by mouth daily.   Omega-3 Fatty Acids (FISH OIL) 600 MG CAPS Take 3,000 mg by mouth daily.   omeprazole  (PRILOSEC) 40 MG capsule Take 1 capsule (40 mg total) by mouth daily.   sildenafil  (REVATIO ) 20 MG tablet TAKE 1 TO 3 TABLETS BY MOUTH AS NEEDED   simvastatin  (ZOCOR ) 40 MG tablet Take 1 tablet (40 mg total) by mouth every evening.   sitaGLIPtin -metformin  (JANUMET ) 50-1000 MG tablet Take 1 tablet by mouth 2 (two) times daily with a meal.    No facility-administered encounter medications on file as of 05/21/2024.    Allergies  Allergen Reactions   Alpha-Gal Other (See Comments)   Cetuximab Other (See Comments)    Pt denies      Review of Systems  Constitutional:  Positive for fever.  Gastrointestinal:  Positive for abdominal pain and diarrhea.   Objective:  BP 137/81   Pulse 80   Temp (!) 97 F (36.1 C)   Ht 5\' 5"  (1.651 m)   Wt 186 lb 6.4 oz (84.6 kg)   SpO2 98%   BMI 31.02 kg/m    Wt Readings from Last 3 Encounters:  05/21/24 186 lb 6.4 oz (84.6 kg)  04/03/24 187 lb (84.8 kg)  03/12/24 185 lb (83.9 kg)   Physical Exam Constitutional:      General: He is awake. He is not in acute distress.    Appearance: Normal appearance. He is well-developed and well-groomed. He is obese. He is ill-appearing. He is not toxic-appearing or diaphoretic.  Cardiovascular:     Rate and Rhythm: Normal rate and regular rhythm.     Pulses: Normal pulses.          Radial pulses are 2+ on the right side and 2+ on the left side.       Posterior tibial pulses are 2+ on the right side and 2+ on the left side.     Heart sounds: Normal heart sounds. No murmur heard.    No gallop.  Pulmonary:     Effort: Pulmonary effort is normal. No respiratory distress.     Breath sounds: Normal breath sounds. No stridor. No wheezing, rhonchi or rales.  Abdominal:     General: Bowel sounds are normal. There is distension.     Palpations: There is no mass.     Tenderness: There is abdominal tenderness in the right lower quadrant. There is right CVA tenderness and guarding. There is no left CVA tenderness. Positive signs include Murphy's sign.     Hernia: No hernia is present.  Musculoskeletal:     Cervical back: Full passive range of motion without pain and neck supple.     Right lower leg: No edema.     Left lower leg: No edema.  Skin:    General: Skin is warm.     Capillary Refill: Capillary refill takes less than 2 seconds.   Neurological:     General: No focal deficit present.     Mental Status: He is alert, oriented to person, place, and time and easily aroused. Mental status is at baseline.     GCS: GCS eye subscore is 4. GCS verbal subscore is 5. GCS motor subscore is 6.     Motor: No weakness.  Psychiatric:        Attention and Perception: Attention and perception normal.        Mood and Affect: Mood and affect normal.        Speech: Speech normal.        Behavior: Behavior normal. Behavior is cooperative.        Thought Content: Thought content normal. Thought content does not include homicidal or suicidal ideation. Thought content does not include homicidal or suicidal plan.        Cognition and Memory: Cognition and memory normal.        Judgment: Judgment normal.     Results for orders placed or performed in visit on 04/03/24  Bayer DCA Hb A1c Waived   Collection Time: 04/03/24  9:33 AM  Result Value Ref Range   HB A1C (BAYER DCA - WAIVED) 6.9 (H) 4.8 - 5.6 %  Microalbumin/Creatinine Ratio, Urine   Collection Time: 04/03/24 10:14 AM  Result Value Ref Range   Creatinine, Urine 53.5 Not Estab. mg/dL   Microalbumin, Urine 96.0 Not Estab. ug/mL   Microalb/Creat Ratio 26 0 - 29 mg/g creat       04/03/2024    9:28 AM 03/12/2024    9:36 AM 12/28/2023   11:01 AM 09/22/2023    8:32 AM 09/22/2023    8:31 AM  Depression screen PHQ 2/9  Decreased Interest 0 0 0  0  Down, Depressed, Hopeless 0 0 0  0  PHQ -  2 Score 0 0 0  0  Altered sleeping   0 0   Tired, decreased energy   0 0   Change in appetite   0 0   Feeling bad or failure about yourself    0 0   Trouble concentrating   0 0   Moving slowly or fidgety/restless   0 0   Suicidal thoughts   0 0   PHQ-9 Score   0    Difficult doing work/chores   Not difficult at all Not difficult at all        12/28/2023   11:01 AM 09/22/2023    8:31 AM 06/08/2023    8:07 AM 12/02/2022    8:19 AM  GAD 7 : Generalized Anxiety Score  Nervous, Anxious, on Edge 0  0 0 0  Control/stop worrying 0 0 0 0  Worry too much - different things 0 0 0 0  Trouble relaxing 0 0 0 0  Restless 0 0 0 0  Easily annoyed or irritable 0 0 0 0  Afraid - awful might happen 0 0 0 0  Total GAD 7 Score 0 0 0 0  Anxiety Difficulty Not difficult at all Not difficult at all Not difficult at all Not difficult at all      Pertinent labs & imaging results that were available during my care of the patient were reviewed by me and considered in my medical decision making.  Assessment & Plan:  Kayceon was seen today for nausea, diarrhea, fever and abdominal pain.  Diagnoses and all orders for this visit:  1. Right lower quadrant abdominal pain (Primary) Imaging and labs as below. Will communicate results to patient once available. Will await results to determine next steps.  Based on UA, will await results of culture to treat for UTI.  Concerned for appendicitis, diverticulitis. Discussed at length with patient and wife red flag symptoms and when to follow up or seek emergency care. They verbalized understanding.  - CT ABDOMEN PELVIS W CONTRAST; Future - CBC with Differential/Platelet - CMP14+EGFR  2. Dark urine As above.  - Urinalysis, Routine w reflex microscopic - Urine Culture   Continue all other maintenance medications.  Follow up plan: Return if symptoms worsen or fail to improve.   Continue healthy lifestyle choices, including diet (rich in fruits, vegetables, and lean proteins, and low in salt and simple carbohydrates) and exercise (at least 30 minutes of moderate physical activity daily).  Written and verbal instructions provided   The above assessment and management plan was discussed with the patient. The patient verbalized understanding of and has agreed to the management plan. Patient is aware to call the clinic if they develop any new symptoms or if symptoms persist or worsen. Patient is aware when to return to the clinic for a follow-up visit. Patient  educated on when it is appropriate to go to the emergency department.   Jacqualyn Mates, DNP-FNP [facility] [address] [place], [place] [address] [phone]

## 2024-05-21 NOTE — H&P (Addendum)
 History and Physical    EWARD Ortiz OZH:[id number] DOB: Jul 26, 1951 DOA: 05/21/2024  PCP: Dettinger, Lucio Sabin, MD  Patient coming from: Home  I have personally briefly reviewed patient's old medical records in [facility]  Chief Complaint: Cholecystitis  HPI: Austin Ortiz is a 73 y.o. male with medical history significant for T2DM, HTN, HLD, OSA who presented to the ED for evaluation of cholecystitis.  Patient has been experiencing right-sided abdominal pain associated with nausea, loose stools, and fevers at home for the last 2 days.  He was seen by his PCP earlier today.  He was sent for outpatient CT abdomen/pelvis with contrast which showed findings consistent with acute cholecystitis.  He was notified of results and recommended to come to the ED for further evaluation.  Patient states that initially abdominal pain was in the right lower quadrant but now more localized to the right upper quadrant.  He is still having some pain but improved compared to earlier.  He denies chest pain, dyspnea, emesis, lower extremity edema.  ED Course  Labs/Imaging on admission: I have personally reviewed following labs and imaging studies.  Initial vitals showed BP 141/68, pulse 85, RR 16, temp 99.1 F, SpO2 95% on room air.  Labs showed WBC 16.0, hemoglobin 13.4, platelets 252, lipase 31, sodium 134, potassium 3.9, bicarb 23, BUN 24, creatinine 0.87, serum glucose 203, LFTs within normal limits.  Patient was given IV Zosyn and IV Dilaudid .  EDP spoke with general surgery (Dr. Lanell Pinta) who requested medical admission and they will see in a.m. with plan for cholecystectomy tomorrow.  The hospitalist service was consulted to admit.  Review of Systems: All systems reviewed and are negative except as documented in history of present illness above.   Past Medical History:  Diagnosis Date   Arthritis    Diabetes mellitus    Esophageal stricture    Foreign body in subcutaneous tissue     metal off of a machine, epigastric abd. area   GERD (gastroesophageal reflux disease)    Hyperlipidemia    Hypertension    Kidney stones    Obstructive sleep apnea 05/28/2013   NO cpap use per pt   Sleep apnea     Past Surgical History:  Procedure Laterality Date   COLONOSCOPY  10/14/2010   [facility]   DRUG INDUCED ENDOSCOPY N/A 03/31/2021   Procedure: DRUG INDUCED ENDOSCOPY;  Surgeon: Virgina Grills, MD;  Location: [facility];  Service: ENT;  Laterality: N/A;   ESOPHAGUS SURGERY  2010   had esophagus stretched   IMPLANTATION OF HYPOGLOSSAL NERVE STIMULATOR Right 05/12/2021   Procedure: IMPLANTATION OF HYPOGLOSSAL NERVE STIMULATOR;  Surgeon: Virgina Grills, MD;  Location: [facility];  Service: ENT;  Laterality: Right;   KIDNEY STONE SURGERY  1979   neg hx     UPPER GASTROINTESTINAL ENDOSCOPY  04/22/2013   [facility]    Social History: Social History   Tobacco Use   Smoking status: Never   Smokeless tobacco: Never  Vaping Use   Vaping status: Never Used  Substance Use Topics   Alcohol use: No   Drug use: No   Allergies  Allergen Reactions   Alpha-Gal Other (See Comments)   Cetuximab Other (See Comments)    Pt denies      Family History  Problem Relation Age of Onset   Heart disease Mother    Hypertension Mother    Diabetes type II Paternal Aunt  x2   Nephrolithiasis Maternal Aunt    Breast cancer Maternal Aunt    Heart attack Father        x 2   Diabetes Maternal Aunt    Diabetes Cousin        mat. side   Prostate cancer Brother    Colon cancer Neg Hx    Colon polyps Neg Hx    Esophageal cancer Neg Hx    Rectal cancer Neg Hx    Stomach cancer Neg Hx      Prior to Admission medications   Medication Sig Start Date End Date Taking? Authorizing Provider  aspirin 81 MG tablet Take 81 mg by mouth daily.    [provider]  dapagliflozin  propanediol (FARXIGA ) 10 MG TABS tablet Take 1 tablet (10 mg total) by mouth daily.  02/06/24   Dettinger, Lucio Sabin, MD  glimepiride  (AMARYL ) 4 MG tablet Take 1 tablet (4 mg total) by mouth daily with breakfast. 04/03/24   Dettinger, Lucio Sabin, MD  glucosamine-chondroitin 500-400 MG tablet Take 1 tablet by mouth 3 (three) times daily.    [provider]  lisinopril -hydrochlorothiazide  (ZESTORETIC ) 10-12.5 MG tablet Take 1 tablet by mouth daily. 04/03/24   Dettinger, Lucio Sabin, MD  meloxicam  (MOBIC ) 15 MG tablet Take 1 tablet (15 mg total) by mouth daily. 04/03/24   Dettinger, Lucio Sabin, MD  Multiple Vitamin (MULTIVITAMIN) tablet Take 1 tablet by mouth daily.    [provider]  Omega-3 Fatty Acids (FISH OIL) 600 MG CAPS Take 3,000 mg by mouth daily.    [provider]  omeprazole  (PRILOSEC) 40 MG capsule Take 1 capsule (40 mg total) by mouth daily. 04/03/24   Dettinger, Lucio Sabin, MD  sildenafil  (REVATIO ) 20 MG tablet TAKE 1 TO 3 TABLETS BY MOUTH AS NEEDED 03/01/24   Dettinger, Lucio Sabin, MD  simvastatin  (ZOCOR ) 40 MG tablet Take 1 tablet (40 mg total) by mouth every evening. 04/03/24   Dettinger, Lucio Sabin, MD  sitaGLIPtin -metformin  (JANUMET ) 50-1000 MG tablet Take 1 tablet by mouth 2 (two) times daily with a meal. 04/03/24   Dettinger, Lucio Sabin, MD    Physical Exam: Vitals:   05/21/24 2114 05/21/24 2300 05/21/24 2329  BP: (!) 141/68 (!) 117/95   Pulse: 85 81   Resp: 16    Temp: 99.1 F (37.3 C)    TempSrc: Oral    SpO2: 95% 97%   Weight:   84.5 kg  Height:   5\' 5"  (1.651 m)   Constitutional: Resting in bed, NAD, calm, comfortable Eyes: EOMI, lids and conjunctivae normal ENMT: Mucous membranes are moist. Posterior pharynx clear of any exudate or lesions.Normal dentition.  Neck: normal, supple, no masses. Respiratory: clear to auscultation bilaterally, no wheezing, no crackles. Normal respiratory effort. No accessory muscle use.  Cardiovascular: Regular rate and rhythm, no murmurs / rubs / gallops. No extremity edema. 2+ pedal pulses. Abdomen: Soft, RUQ  tenderness, no masses palpated. Musculoskeletal: no clubbing / cyanosis. No joint deformity upper and lower extremities. Good ROM, no contractures. Normal muscle tone.  Skin: no rashes, lesions, ulcers. No induration Neurologic: Sensation intact. Strength 5/5 in all 4.  Psychiatric: Normal judgment and insight. Alert and oriented x 3. Normal mood.   EKG: Not performed. Assessment/Plan Principal Problem:   Acute cholecystitis Active Problems:   T2DM (type 2 diabetes mellitus) (HCC)   Obstructive sleep apnea   Hypertension associated with diabetes (HCC)   Hyperlipidemia associated with type 2 diabetes mellitus (HCC)   Renal calculus,  right   Austin Ortiz is a 73 y.o. male with medical history significant for T2DM, HTN, HLD, OSA who is admitted with acute cholecystitis.  Assessment and Plan: Acute cholecystitis: Outpatient CT A/P showed findings consistent with acute cholecystitis.  No CBD dilation.  No comment on gallstones.  LFTs WNL.  Patient with leukocytosis otherwise hemodynamically stable. - General Surgery consult pending, anticipate cholecystectomy tomorrow - N.p.o. after midnight - Continue IV ceftriaxone/Flagyl - Continue IV fluid hydration overnight  Type 2 diabetes: Holding home Farxiga , Amaryl , Janumet .  Placed on SSI every 4 hours while NPO.  Hypertension: Currently normotensive.  Holding home lisinopril -HCTZ for now.  Right renal calculus: 8 mm nonobstructing right renal calculus seen on CT imaging.  No evidence of ureteral calculi or hydronephrosis.  Renal function stable.  Can follow-up with urology as an outpatient.  Hyperlipidemia: Statin on hold tonight.  OSA: CPAP intolerant.  Uses inspire, will need to be deactivated perioperatively.   DVT prophylaxis: SCDs Start: 05/21/24 2345 Code Status: Full code, confirmed with patient on admission Family Communication: Spouse at bedside Disposition Plan: From home likely discharge to home pending clinical  progress Consults called: General Surgery Severity of Illness: The appropriate patient status for this patient is INPATIENT. Inpatient status is judged to be reasonable and necessary in order to provide the required intensity of service to ensure the patient's safety. The patient's presenting symptoms, physical exam findings, and initial radiographic and laboratory data in the context of their chronic comorbidities is felt to place them at high risk for further clinical deterioration. Furthermore, it is not anticipated that the patient will be medically stable for discharge from the hospital within 2 midnights of admission.   * I certify that at the point of admission it is my clinical judgment that the patient will require inpatient hospital care spanning beyond 2 midnights from the point of admission due to high intensity of service, high risk for further deterioration and high frequency of surveillance required.Edith Gores MD [facility]  If 7PM-7AM, please contact night-coverage [email or web address]  05/21/2024, 11:48 PM

## 2024-05-22 ENCOUNTER — Inpatient Hospital Stay (HOSPITAL_COMMUNITY): Admitting: Anesthesiology

## 2024-05-22 ENCOUNTER — Encounter (HOSPITAL_COMMUNITY)
Admission: EM | Disposition: A | Discharge status: Home / Self Care | Payer: Self-pay | Source: Home / Self Care | Attending: Emergency Medicine

## 2024-05-22 ENCOUNTER — Encounter (HOSPITAL_COMMUNITY): Payer: Self-pay | Admitting: Internal Medicine

## 2024-05-22 ENCOUNTER — Other Ambulatory Visit: Payer: Self-pay

## 2024-05-22 ENCOUNTER — Ambulatory Visit: Payer: Self-pay | Admitting: Family Medicine

## 2024-05-22 DIAGNOSIS — E1169 Type 2 diabetes mellitus with other specified complication: Secondary | ICD-10-CM | POA: Diagnosis not present

## 2024-05-22 DIAGNOSIS — E66811 Obesity, class 1: Secondary | ICD-10-CM | POA: Diagnosis not present

## 2024-05-22 DIAGNOSIS — E6609 Other obesity due to excess calories: Secondary | ICD-10-CM

## 2024-05-22 DIAGNOSIS — K810 Acute cholecystitis: Secondary | ICD-10-CM

## 2024-05-22 DIAGNOSIS — I152 Hypertension secondary to endocrine disorders: Secondary | ICD-10-CM | POA: Diagnosis not present

## 2024-05-22 DIAGNOSIS — K8012 Calculus of gallbladder with acute and chronic cholecystitis without obstruction: Secondary | ICD-10-CM | POA: Diagnosis not present

## 2024-05-22 DIAGNOSIS — K819 Cholecystitis, unspecified: Principal | ICD-10-CM | POA: Diagnosis present

## 2024-05-22 DIAGNOSIS — E785 Hyperlipidemia, unspecified: Secondary | ICD-10-CM | POA: Diagnosis not present

## 2024-05-22 DIAGNOSIS — N200 Calculus of kidney: Secondary | ICD-10-CM | POA: Diagnosis not present

## 2024-05-22 DIAGNOSIS — G4733 Obstructive sleep apnea (adult) (pediatric): Secondary | ICD-10-CM | POA: Diagnosis not present

## 2024-05-22 DIAGNOSIS — Z6831 Body mass index (BMI) 31.0-31.9, adult: Secondary | ICD-10-CM | POA: Diagnosis not present

## 2024-05-22 DIAGNOSIS — E119 Type 2 diabetes mellitus without complications: Secondary | ICD-10-CM | POA: Diagnosis not present

## 2024-05-22 DIAGNOSIS — K429 Umbilical hernia without obstruction or gangrene: Secondary | ICD-10-CM | POA: Diagnosis not present

## 2024-05-22 DIAGNOSIS — E1159 Type 2 diabetes mellitus with other circulatory complications: Secondary | ICD-10-CM | POA: Diagnosis not present

## 2024-05-22 HISTORY — PX: CHOLECYSTECTOMY: SHX55

## 2024-05-22 LAB — CBC WITH DIFFERENTIAL/PLATELET
Basophils Absolute: 0.1 10*3/uL (ref 0.0–0.2)
Basos: 0 %
EOS (ABSOLUTE): 0 10*3/uL (ref 0.0–0.4)
Eos: 0 %
Hematocrit: 43.1 % (ref 37.5–51.0)
Hemoglobin: 13.9 g/dL (ref 13.0–17.7)
Immature Grans (Abs): 0.1 10*3/uL (ref 0.0–0.1)
Immature Granulocytes: 1 %
Lymphocytes Absolute: 2.1 10*3/uL (ref 0.7–3.1)
Lymphs: 12 %
MCH: 29 pg (ref 26.6–33.0)
MCHC: 32.3 g/dL (ref 31.5–35.7)
MCV: 90 fL (ref 79–97)
Monocytes Absolute: 1.3 10*3/uL — ABNORMAL HIGH (ref 0.1–0.9)
Monocytes: 7 %
Neutrophils Absolute: 14.3 10*3/uL — ABNORMAL HIGH (ref 1.4–7.0)
Neutrophils: 80 %
Platelets: 267 10*3/uL (ref 150–450)
RBC: 4.8 x10E6/uL (ref 4.14–5.80)
RDW: 13.4 % (ref 11.6–15.4)
WBC: 17.8 10*3/uL — ABNORMAL HIGH (ref 3.4–10.8)

## 2024-05-22 LAB — CBC
HCT: 41.4 % (ref 39.0–52.0)
Hemoglobin: 13 g/dL (ref 13.0–17.0)
MCH: 28.9 pg (ref 26.0–34.0)
MCHC: 31.4 g/dL (ref 30.0–36.0)
MCV: 92 fL (ref 80.0–100.0)
Platelets: 250 10*3/uL (ref 150–400)
RBC: 4.5 MIL/uL (ref 4.22–5.81)
RDW: 14.5 % (ref 11.5–15.5)
WBC: 15.6 10*3/uL — ABNORMAL HIGH (ref 4.0–10.5)
nRBC: 0 % (ref 0.0–0.2)

## 2024-05-22 LAB — GLUCOSE, CAPILLARY
Glucose-Capillary: 103 mg/dL — ABNORMAL HIGH (ref 70–99)
Glucose-Capillary: 106 mg/dL — ABNORMAL HIGH (ref 70–99)
Glucose-Capillary: 119 mg/dL — ABNORMAL HIGH (ref 70–99)
Glucose-Capillary: 132 mg/dL — ABNORMAL HIGH (ref 70–99)
Glucose-Capillary: 147 mg/dL — ABNORMAL HIGH (ref 70–99)
Glucose-Capillary: 159 mg/dL — ABNORMAL HIGH (ref 70–99)
Glucose-Capillary: 161 mg/dL — ABNORMAL HIGH (ref 70–99)
Glucose-Capillary: 173 mg/dL — ABNORMAL HIGH (ref 70–99)
Glucose-Capillary: 245 mg/dL — ABNORMAL HIGH (ref 70–99)

## 2024-05-22 LAB — COMPREHENSIVE METABOLIC PANEL WITH GFR
ALT: 26 U/L (ref 0–44)
AST: 29 U/L (ref 15–41)
Albumin: 3 g/dL — ABNORMAL LOW (ref 3.5–5.0)
Alkaline Phosphatase: 49 U/L (ref 38–126)
Anion gap: 10 (ref 5–15)
BUN: 23 mg/dL (ref 8–23)
CO2: 22 mmol/L (ref 22–32)
Calcium: 8.5 mg/dL — ABNORMAL LOW (ref 8.9–10.3)
Chloride: 103 mmol/L (ref 98–111)
Creatinine, Ser: 0.85 mg/dL (ref 0.61–1.24)
GFR, Estimated: >60 mL/min (ref >60)
Glucose, Bld: 128 mg/dL — ABNORMAL HIGH (ref 70–99)
Potassium: 4.2 mmol/L (ref 3.5–5.1)
Sodium: 135 mmol/L (ref 135–145)
Total Bilirubin: 1.1 mg/dL (ref 0.0–1.2)
Total Protein: 6.9 g/dL (ref 6.5–8.1)

## 2024-05-22 LAB — CMP14+EGFR
ALT: 23 IU/L (ref 0–44)
AST: 21 IU/L (ref 0–40)
Albumin: 4 g/dL (ref 3.8–4.8)
Alkaline Phosphatase: 61 IU/L (ref 44–121)
BUN/Creatinine Ratio: 20 (ref 10–24)
BUN: 21 mg/dL (ref 8–27)
Bilirubin Total: 0.7 mg/dL (ref 0.0–1.2)
CO2: 20 mmol/L (ref 20–29)
Calcium: 8.8 mg/dL (ref 8.6–10.2)
Chloride: 97 mmol/L (ref 96–106)
Creatinine, Ser: 1.07 mg/dL (ref 0.76–1.27)
Globulin, Total: 2.6 g/dL (ref 1.5–4.5)
Glucose: 286 mg/dL — ABNORMAL HIGH (ref 70–99)
Potassium: 4.4 mmol/L (ref 3.5–5.2)
Sodium: 133 mmol/L — ABNORMAL LOW (ref 134–144)
Total Protein: 6.6 g/dL (ref 6.0–8.5)
eGFR: 74 mL/min/{1.73_m2} (ref >59)

## 2024-05-22 LAB — URINE CULTURE: Organism ID, Bacteria: NO GROWTH

## 2024-05-22 SURGERY — LAPAROSCOPIC CHOLECYSTECTOMY WITH INTRAOPERATIVE CHOLANGIOGRAM
Anesthesia: General

## 2024-05-22 MED ORDER — BUPIVACAINE-EPINEPHRINE (PF) 0.5% -1:200000 IJ SOLN
INTRAMUSCULAR | Status: AC
  Filled 2024-05-22: qty 30

## 2024-05-22 MED ORDER — FENTANYL CITRATE (PF) 100 MCG/2ML IJ SOLN
INTRAMUSCULAR | Status: AC
Start: 2024-05-22 — End: ?
  Filled 2024-05-22: qty 2

## 2024-05-22 MED ORDER — DEXAMETHASONE SODIUM PHOSPHATE 10 MG/ML IJ SOLN: INTRAMUSCULAR | Status: DC | PRN

## 2024-05-22 MED ORDER — HYDROCODONE-ACETAMINOPHEN 5-325 MG PO TABS: 1.0000 | ORAL_TABLET | ORAL | Status: DC | PRN

## 2024-05-22 MED ORDER — LACTATED RINGERS IV SOLN: INTRAVENOUS | Status: DC | PRN

## 2024-05-22 MED ORDER — BUPIVACAINE-EPINEPHRINE 0.5% -1:200000 IJ SOLN
INTRAMUSCULAR | Status: DC | PRN
  Administered 2024-05-22: 30 mL

## 2024-05-22 MED ORDER — LACTATED RINGERS IR SOLN
Status: DC | PRN
  Administered 2024-05-22: 1000 mL

## 2024-05-22 MED ORDER — ROCURONIUM BROMIDE 10 MG/ML (PF) SYRINGE
PREFILLED_SYRINGE | INTRAVENOUS | Status: AC
  Filled 2024-05-22: qty 10

## 2024-05-22 MED ORDER — LIDOCAINE HCL (CARDIAC) PF 100 MG/5ML IV SOSY
PREFILLED_SYRINGE | INTRAVENOUS | Status: DC | PRN
  Administered 2024-05-22: 100 mg via INTRATRACHEAL

## 2024-05-22 MED ORDER — LIDOCAINE HCL (PF) 2 % IJ SOLN
INTRAMUSCULAR | Status: AC
  Filled 2024-05-22: qty 5

## 2024-05-22 MED ORDER — PROPOFOL 10 MG/ML IV BOLUS
INTRAVENOUS | Status: DC | PRN
  Administered 2024-05-22: 50 mg via INTRAVENOUS
  Administered 2024-05-22: 150 mg via INTRAVENOUS

## 2024-05-22 MED ORDER — ACETAMINOPHEN 500 MG PO TABS
1000.0000 mg | ORAL_TABLET | Freq: Four times a day (QID) | ORAL | Status: DC | PRN
  Administered 2024-05-22: 1000 mg via ORAL
  Filled 2024-05-22: qty 2

## 2024-05-22 MED ORDER — CHLORHEXIDINE GLUCONATE 0.12 % MT SOLN
15.0000 mL | Freq: Once | OROMUCOSAL | Status: AC
  Administered 2024-05-22: 15 mL via OROMUCOSAL

## 2024-05-22 MED ORDER — MORPHINE SULFATE (PF) 2 MG/ML IV SOLN: 2.0000 mg | INTRAVENOUS | Status: DC | PRN

## 2024-05-22 MED ORDER — ACETAMINOPHEN 325 MG PO TABS: 650.0000 mg | ORAL_TABLET | Freq: Four times a day (QID) | ORAL | Status: DC | PRN

## 2024-05-22 MED ORDER — FENTANYL CITRATE (PF) 100 MCG/2ML IJ SOLN
INTRAMUSCULAR | Status: DC | PRN
  Administered 2024-05-22: 100 ug via INTRAVENOUS

## 2024-05-22 MED ORDER — ONDANSETRON HCL 4 MG/2ML IJ SOLN
INTRAMUSCULAR | Status: AC
  Filled 2024-05-22: qty 2

## 2024-05-22 MED ORDER — ONDANSETRON HCL 4 MG/2ML IJ SOLN
INTRAMUSCULAR | Status: DC | PRN
  Administered 2024-05-22: 4 mg via INTRAVENOUS

## 2024-05-22 MED ORDER — ACETAMINOPHEN 650 MG RE SUPP: 650.0000 mg | Freq: Four times a day (QID) | RECTAL | Status: DC | PRN

## 2024-05-22 MED ORDER — ROCURONIUM BROMIDE 100 MG/10ML IV SOLN
INTRAVENOUS | Status: DC | PRN
Start: 2024-05-22 — End: 2024-05-22
  Administered 2024-05-22: 60 mg via INTRAVENOUS

## 2024-05-22 MED ORDER — PHENYLEPHRINE 80 MCG/ML (10ML) SYRINGE FOR IV PUSH (FOR BLOOD PRESSURE SUPPORT)
PREFILLED_SYRINGE | INTRAVENOUS | Status: AC
  Filled 2024-05-22: qty 10

## 2024-05-22 MED ORDER — STERILE WATER FOR IRRIGATION IR SOLN
Status: DC | PRN
  Administered 2024-05-22: 1000 mL

## 2024-05-22 MED ORDER — 0.9 % SODIUM CHLORIDE (POUR BTL) OPTIME
TOPICAL | Status: DC | PRN
  Administered 2024-05-22: 1000 mL

## 2024-05-22 MED ORDER — FENTANYL CITRATE (PF) 100 MCG/2ML IJ SOLN
INTRAMUSCULAR | Status: AC
  Filled 2024-05-22: qty 2

## 2024-05-22 MED ORDER — SUGAMMADEX SODIUM 200 MG/2ML IV SOLN
INTRAVENOUS | Status: DC | PRN
  Administered 2024-05-22: 200 mg via INTRAVENOUS

## 2024-05-22 MED ORDER — PROPOFOL 10 MG/ML IV BOLUS
INTRAVENOUS | Status: AC
  Filled 2024-05-22: qty 20

## 2024-05-22 MED ORDER — LACTATED RINGERS IV SOLN: INTRAVENOUS | Status: DC

## 2024-05-22 MED ORDER — DEXAMETHASONE SODIUM PHOSPHATE 10 MG/ML IJ SOLN
INTRAMUSCULAR | Status: AC
  Filled 2024-05-22: qty 1

## 2024-05-22 MED ORDER — PIPERACILLIN-TAZOBACTAM 3.375 G IVPB
3.3750 g | Freq: Three times a day (TID) | INTRAVENOUS | Status: DC
  Administered 2024-05-22 – 2024-05-23 (×3): 3.375 g via INTRAVENOUS
  Filled 2024-05-22 (×3): qty 50

## 2024-05-22 SURGICAL SUPPLY — 31 items
BAG COUNTER SPONGE SURGICOUNT (BAG) IMPLANT
CABLE HIGH FREQUENCY MONO STRZ (ELECTRODE) ×1 IMPLANT
CHLORAPREP W/TINT 26 (MISCELLANEOUS) ×2 IMPLANT
CLIP APPLIE ROT 10 11.4 M/L (STAPLE) ×1 IMPLANT
COVER MAYO STAND XLG (MISCELLANEOUS) ×1 IMPLANT
COVER SURGICAL LIGHT HANDLE (MISCELLANEOUS) ×1 IMPLANT
DERMABOND ADVANCED .7 DNX12 (GAUZE/BANDAGES/DRESSINGS) IMPLANT
DRAPE C-ARM 42X120 X-RAY (DRAPES) ×1 IMPLANT
ELECT REM PT RETURN 15FT ADLT (MISCELLANEOUS) ×1 IMPLANT
GAUZE SPONGE 2X2 8PLY STRL LF (GAUZE/BANDAGES/DRESSINGS) ×1 IMPLANT
GLOVE SURG ORTHO 8.0 STRL STRW (GLOVE) ×1 IMPLANT
GLOVE SURG SYN 7.5 E (GLOVE) ×2 IMPLANT
GLOVE SURG SYN 7.5 PF PI (GLOVE) ×2 IMPLANT
GOWN STRL REUS W/ TWL XL LVL3 (GOWN DISPOSABLE) ×1 IMPLANT
IRRIGATION SUCT STRKRFLW 2 WTP (MISCELLANEOUS) ×1 IMPLANT
KIT BASIN OR (CUSTOM PROCEDURE TRAY) ×1 IMPLANT
KIT TURNOVER KIT A (KITS) IMPLANT
SCISSORS LAP 5X35 DISP (ENDOMECHANICALS) ×1 IMPLANT
SET CHOLANGIOGRAPH MIX (MISCELLANEOUS) ×1 IMPLANT
SET TUBE SMOKE EVAC HIGH FLOW (TUBING) ×1 IMPLANT
SLEEVE Z-THREAD 5X100MM (TROCAR) ×1 IMPLANT
SPIKE FLUID TRANSFER (MISCELLANEOUS) ×1 IMPLANT
SUT MNCRL AB 4-0 PS2 18 (SUTURE) ×1 IMPLANT
SUT NOVA 0 T19/GS 22DT (SUTURE) IMPLANT
SUT VIC AB 2-0 SH 27XBRD (SUTURE) IMPLANT
SYSTEM BAG RETRIEVAL 10MM (BASKET) ×1 IMPLANT
TOWEL OR 17X26 10 PK STRL BLUE (TOWEL DISPOSABLE) ×1 IMPLANT
TRAY LAPAROSCOPIC (CUSTOM PROCEDURE TRAY) ×1 IMPLANT
TROCAR 11X100 Z THREAD (TROCAR) ×1 IMPLANT
TROCAR BALLN 12MMX100 BLUNT (TROCAR) ×1 IMPLANT
TROCAR Z-THREAD OPTICAL 5X100M (TROCAR) ×1 IMPLANT

## 2024-05-22 NOTE — Op Note (Addendum)
 Procedure Note  Pre-operative Diagnosis:  Acute cholecystitis, umbilical hernia  Post-operative Diagnosis:  Acute gangrenous cholecystitis, umbilical hernia  Surgeon:  Oralee Billow, MD  Assistant:  Aurea Leek, PA-C   Procedure:  Laparoscopic cholecystectomy, primary repair umbilical hernia  Anesthesia:  General  Estimated Blood Loss:  25 cc  Drains: none         Specimen: gallbladder to pathology  Indications:  Patient is a 73 year old male with signs and symptoms of acute cholecystitis.  CT scan shows signs of inflammation but no gallstones.  Patient now comes to surgery for cholecystectomy.  Procedure description: The patient was seen in the pre-op holding area. The risks, benefits, complications, treatment options, and expected outcomes were previously discussed with the patient. The patient agreed with the proposed plan and has signed the informed consent form.  The patient was transported to operating room #4 at the [facility]. The patient was placed in the supine position on the operating room table. Following induction of general anesthesia, the abdomen was prepped and draped in the usual aseptic fashion.  After ascertaining that an adequate level of anesthesia been achieved, an infraumbilical incision was made with a #15 blade.  Dissection was carried in the subcutaneous tissues.  Patient had an obvious umbilical hernia.  Therefore the skin was elevated off of the hernia sac.  The hernia sac was dissected out circumferentially.  The hernia sac was opened and excised and discarded.  The peritoneal cavity was entered and a Hasson cannula was introduced under direct vision. The cannula was secured with a 0-Vicryl pursestring suture. Pneumoperitoneum was established with carbon dioxide. Additional cannulae were introduced under direct vision along the right costal margin in the midline, mid-clavicular line, and anterior axillary line.   The gallbladder was identified and the  fundus grasped and retracted cephalad.  The gallbladder appeared to be necrotic, gray and green in color, without perforation.  Adhesions were taken down bluntly and the electrocautery was utilized as needed, taking care not to involve any adjacent structures. The infundibulum was grasped and retracted laterally, exposing the peritoneum overlying the triangle of Calot. The peritoneum was incised and structures exposed with blunt dissection. The cystic duct was clearly identified, bluntly dissected circumferentially, and clipped at the neck of the gallbladder. The cystic duct was then ligated with ligaclips and divided. The cystic artery was identified, dissected circumferentially, ligated with ligaclips, and divided.  The gallbladder was dissected away from the gallbladder bed using the electrocautery for hemostasis. The gallbladder was completely removed from the liver and placed into an endocatch bag. The gallbladder was removed in the endocatch bag through the umbilical port site and submitted to pathology for review.  The right upper quadrant was irrigated and the gallbladder bed was inspected. Hemostasis was achieved with the electrocautery.  Cannulae were removed under direct vision and good hemostasis was noted. Pneumoperitoneum was released and the majority of the carbon dioxide evacuated.  The fascial defect at the umbilicus is then closed with interrupted 0 Novafil simple sutures.  Local anesthetic is infiltrated in the fascia.  Overlying subcutaneous tissues are then closed with interrupted 2-0 Vicryl sutures.  Local anesthetic was infiltrated at all port sites. Skin incisions were closed with 4-0 Monocril subcuticular sutures and Dermabond was applied.  Instrument, sponge, and needle counts were correct at the conclusion of the case.  The patient was awakened from anesthesia and brought to the recovery room in stable condition.  The patient tolerated the procedure well.  Oralee Billow,  MD [facility] Surgery Office: [phone]

## 2024-05-22 NOTE — Anesthesia Preprocedure Evaluation (Addendum)
 Anesthesia Evaluation  Patient identified by MRN, date of birth, ID band Patient awake    Reviewed: Allergy & Precautions, NPO status , Patient's Chart, lab work & pertinent test results  History of Anesthesia Complications Negative for: history of anesthetic complications  Airway Mallampati: III  TM Distance: >3 FB Neck ROM: Full   Comment: Previous grade II view with MAC 3, easy mask Dental  (+) Dental Advisory Given He only has 6 teeth on the bottom and 6 on the top. He denies any loose teeth.:   Pulmonary neg shortness of breath, sleep apnea (Inspire, currently turned off by remote) , neg COPD, neg recent URI   Pulmonary exam normal breath sounds clear to auscultation       Cardiovascular hypertension (lisinopril -HCTZ), Pt. on medications (-) angina (-) Past MI, (-) Cardiac Stents and (-) CABG (-) dysrhythmias  Rhythm:Regular Rate:Normal  HLD   Neuro/Psych negative neurological ROS     GI/Hepatic Neg liver ROS,GERD  Medicated,,Acute cholecystitis   Endo/Other  diabetes, Type 2, Oral Hypoglycemic Agents    Renal/GU negative Renal ROS     Musculoskeletal  (+) Arthritis ,    Abdominal  (+) + obese  Peds  Hematology negative hematology ROS (+) Lab Results      Component                Value               Date                      WBC                      15.6 (H)            05/22/2024                HGB                      13.0                05/22/2024                HCT                      41.4                05/22/2024                MCV                      92.0                05/22/2024                PLT                      250                 05/22/2024              Anesthesia Other Findings 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 He takes Tylenol  at home.  Reproductive/Obstetrics                             Anesthesia Physical Anesthesia Plan  ASA: 2  Anesthesia Plan: General    Post-op Pain Management:  Induction: Intravenous  PONV Risk Score and Plan: 2 and Ondansetron , Dexamethasone  and Treatment may vary due to age or medical condition  Airway Management Planned: Oral ETT  Additional Equipment:   Intra-op Plan:   Post-operative Plan: Extubation in OR  Informed Consent: I have reviewed the patients History and Physical, chart, labs and discussed the procedure including the risks, benefits and alternatives for the proposed anesthesia with the patient or authorized representative who has indicated his/her understanding and acceptance.     Dental advisory given  Plan Discussed with: CRNA and Anesthesiologist  Anesthesia Plan Comments: (Risks of general anesthesia discussed including, but not limited to, sore throat, hoarse voice, chipped/damaged teeth, injury to vocal cords, nausea and vomiting, allergic reactions, lung infection, heart attack, stroke, and death. All questions answered. )        Anesthesia Quick Evaluation

## 2024-05-22 NOTE — Progress Notes (Signed)
 Pt returned from surgery alert and oriented, 2L [place] with 4 lap chole sites, glue, intact. Denies pain. Transferred to bed. Pt stable, VSS. Pt in no acute distress.

## 2024-05-22 NOTE — Progress Notes (Signed)
Addressed by on-call provider

## 2024-05-22 NOTE — Progress Notes (Signed)
 Progress Note   Patient: Austin Ortiz VHQ:[id number] DOB: 04-23-1951 DOA: 05/21/2024     1 DOS: the patient was seen and examined on 05/22/2024   Brief hospital course: Austin Ortiz is a 73 y.o. male with medical history significant for T2DM, HTN, HLD, OSA who is admitted with acute cholecystitis.  Assessment and Plan: Acute cholecystitis: Outpatient CT A/P showed findings consistent with acute cholecystitis.  No CBD dilation.  No comment on gallstones.  LFTs WNL.  Patient with leukocytosis otherwise hemodynamically stable. General Surgery consult appreciated. He will go for possible Lap cholecystectomy today N.p.o. for surgical intervention Continue IV ceftriaxone/Flagyl Continue IV fluid hydration.   Type 2 diabetes: Holding home Farxiga , Amaryl , Janumet .  Continue accucheks, SSI every 4 hours while NPO.   Hypertension: Currently normotensive.  Holding home lisinopril -HCTZ for now.   Right renal calculus: 8 mm nonobstructing right renal calculus seen on CT imaging.  No evidence of ureteral calculi or hydronephrosis.  Renal function stable.  Follow-up with urology as an outpatient.   Hyperlipidemia: Statin on hold tonight.   OSA: CPAP intolerant.  Uses inspire at home.  Obesity Class I- Diet, exercise and weight reduction advised.      Out of bed to chair. Incentive spirometry. Nursing supportive care. Fall, aspiration precautions. Diet:  Diet Orders (From admission, onward)     Start     Ordered   05/22/24 0001  Diet NPO time specified Except for: Ice Chips  Diet effective midnight       Question:  Except for  Answer:  Ice Chips   05/21/24 2321           DVT prophylaxis: SCDs Start: 05/21/24 2345  Level of care: Med-Surg   Code Status: Full Code  Subjective: Patient is seen and examined today morning. He is lying in bed, has RUQ tenderness. No nausea or vomiting.   Physical Exam: Vitals:   05/22/24 0739 05/22/24 0801 05/22/24 1247 05/22/24 1252   BP: 127/70 116/64 (!) 145/73   Pulse: 76 77 86   Resp: 18 16 16    Temp: 99.1 F (37.3 C) 98.7 F (37.1 C) (!) 102.8 F (39.3 C)   TempSrc: Oral Oral Oral   SpO2: 96% 95% 96%   Weight:    84.5 kg  Height:    5\' 5"  (1.651 m)    General - Elderly Caucasian obese male, no apparent distress HEENT - PERRLA, EOMI, atraumatic head, non tender sinuses. Lung - Clear, basal rales, no rhonchi, wheezes. Heart - S1, S2 heard, no murmurs, rubs, trace pedal edema. Abdomen - Soft, RUQ tenderness, bowel sounds good Neuro - Alert, awake and oriented x 3, non focal exam. Skin - Warm and dry.  Data Reviewed:      Latest Ref Rng & Units 05/22/2024    4:41 AM 05/21/2024   10:40 PM 05/21/2024   11:50 AM  CBC  WBC 4.0 - 10.5 K/uL 15.6  16.0  17.8   Hemoglobin 13.0 - 17.0 g/dL 41.3  24.4  01.0   Hematocrit 39.0 - 52.0 % 41.4  40.7  43.1   Platelets 150 - 400 K/uL 250  252  267       Latest Ref Rng & Units 05/22/2024    4:41 AM 05/21/2024   10:40 PM 05/21/2024    5:28 PM  BMP  Glucose 70 - 99 mg/dL 272  536    BUN 8 - 23 mg/dL 23  24    Creatinine 6.44 - 1.24  mg/dL 1.61  0.96  0.45   Sodium 135 - 145 mmol/L 135  134    Potassium 3.5 - 5.1 mmol/L 4.2  3.9    Chloride 98 - 111 mmol/L 103  100    CO2 22 - 32 mmol/L 22  23    Calcium 8.9 - 10.3 mg/dL 8.5  8.9     CT ABDOMEN PELVIS W CONTRAST Result Date: 05/21/2024 CLINICAL DATA:  Right lower quadrant pain. EXAM: CT ABDOMEN AND PELVIS WITH CONTRAST TECHNIQUE: Multidetector CT imaging of the abdomen and pelvis was performed using the standard protocol following bolus administration of intravenous contrast. RADIATION DOSE REDUCTION: This exam was performed according to the departmental dose-optimization program which includes automated exposure control, adjustment of the mA and/or kV according to patient size and/or use of iterative reconstruction technique. CONTRAST:  100mL OMNIPAQUE IOHEXOL 300 MG/ML  SOLN COMPARISON:  None Available. FINDINGS: Lower  Chest: No acute findings. Hepatobiliary: No suspicious hepatic masses identified. The gallbladder is distended and contains a small amount of lesion ring sludge or tiny gallstones. Mild gallbladder wall thickening is seen with hazy inflammatory changes in the pericholecystic fat. These findings are consistent with acute cholecystitis. No No evidence of biliary ductal dilatation. Pancreas:  No mass or inflammatory changes. Spleen: Within normal limits in size and appearance. Adrenals/Urinary Tract: No suspicious masses identified. 8 mm nonobstructing calculus in lower pole of right kidney. No evidence of ureteral calculi or hydronephrosis. Stomach/Bowel: Tiny hiatal hernia. No evidence of obstruction, inflammatory process or abnormal fluid collections. Normal appendix visualized. Vascular/Lymphatic: No pathologically enlarged lymph nodes. No acute vascular findings. Reproductive:  No mass or other significant abnormality. Other:  Tiny umbilical hernia which contains only fat. Musculoskeletal:  No suspicious bone lesions identified. IMPRESSION: Findings consistent with acute cholecystitis. 8 mm nonobstructing right renal calculus. No evidence of ureteral calculi or hydronephrosis. Tiny hiatal hernia and umbilical hernia. These results will be called to the ordering clinician or representative by the Radiologist Assistant, and communication documented in the PACS or [employer]. Electronically Signed   By: Marlyce Sine M.D.   On: 05/21/2024 17:56    Family Communication: Discussed with patient, wife at bedside. They understand and agree. All questions answered.  Disposition: Status is: Inpatient Remains inpatient appropriate because: IV antibiotics, surgical intervention  Planned Discharge Destination: Home     Time spent: 40 minutes  Author: Aisha Hove, MD 05/22/2024 3:07 PM Secure chat 7am to 7pm For on call review [email or web address].

## 2024-05-22 NOTE — Anesthesia Postprocedure Evaluation (Signed)
 Anesthesia Post Note  Patient: Austin Ortiz  Procedure(s) Performed: LAPAROSCOPIC CHOLECYSTECTOMY     Patient location during evaluation: PACU Anesthesia Type: General Level of consciousness: awake and alert Pain management: pain level controlled Vital Signs Assessment: post-procedure vital signs reviewed and stable Respiratory status: spontaneous breathing, nonlabored ventilation and respiratory function stable Cardiovascular status: blood pressure returned to baseline and stable Postop Assessment: no apparent nausea or vomiting Anesthetic complications: no   No notable events documented.  Last Vitals:  Vitals:   05/22/24 1715 05/22/24 1730  BP: 137/64 135/65  Pulse: 72 74  Resp: 15 15  Temp:    SpO2: 100% 100%    Last Pain:  Vitals:   05/22/24 1730  TempSrc:   PainSc: 0-No pain                 Earvin Goldberg

## 2024-05-22 NOTE — Consult Note (Addendum)
 Austin Ortiz 06/08/1951  [id number].    Requesting MD: Butch Cashing, MD Chief Complaint/Reason for Consult: cholecystitis   HPI:  [name] is a 73 y/o M with a PMH OSA s/p hypoglossal nerve stimulator implantation, GERD, T2DM, HTN, and HLD who presents with abdominal pain. States the pain started Saturday night into Sunday morning (4 days ago). Described as RLQ pain with abdominal distention, then the pain localized to the RUQ. Pain worse with inspiration. Pain was not relieved by antacid medications. Associated with fever, nausea, and diarrhea. Denies any known history of similar attacks. He has a history of esophageal stricture requiring dilation but denies a history of abdominal surgery. Denies any complications under anesthesia for colonoscopy/endoscopy in the past. Denies use of blood thinners, takes baby ASA. Allergies include alpha-gal. Denies tobacco or alcohol use. 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 Says [place]9 with his church.   ROS: all other systems negative  Review of Systems  All other systems reviewed and are negative.   Family History  Problem Relation Age of Onset   Heart disease Mother    Hypertension Mother    Diabetes type II Paternal Aunt        x2   Nephrolithiasis Maternal Aunt    Breast cancer Maternal Aunt    Heart attack Father        x 2   Diabetes Maternal Aunt    Diabetes Cousin        mat. side   Prostate cancer Brother    Colon cancer Neg Hx    Colon polyps Neg Hx    Esophageal cancer Neg Hx    Rectal cancer Neg Hx    Stomach cancer Neg Hx     Past Medical History:  Diagnosis Date   Arthritis    Diabetes mellitus    Esophageal stricture    Foreign body in subcutaneous tissue    metal off of a machine, epigastric abd. area   GERD (gastroesophageal reflux disease)    Hyperlipidemia    Hypertension    Kidney stones    Obstructive sleep apnea 05/28/2013   NO cpap use per pt   Sleep apnea      Past Surgical History:  Procedure Laterality Date   COLONOSCOPY  10/14/2010   [facility]   DRUG INDUCED ENDOSCOPY N/A 03/31/2021   Procedure: DRUG INDUCED ENDOSCOPY;  Surgeon: Virgina Grills, MD;  Location: [facility];  Service: ENT;  Laterality: N/A;   ESOPHAGUS SURGERY  2010   had esophagus stretched   IMPLANTATION OF HYPOGLOSSAL NERVE STIMULATOR Right 05/12/2021   Procedure: IMPLANTATION OF HYPOGLOSSAL NERVE STIMULATOR;  Surgeon: Virgina Grills, MD;  Location: [facility];  Service: ENT;  Laterality: Right;   KIDNEY STONE SURGERY  1979   neg hx     UPPER GASTROINTESTINAL ENDOSCOPY  04/22/2013   [facility]    Social History:  reports that he has never smoked. He has never used smokeless tobacco. He reports that he does not drink alcohol and does not use drugs.  Allergies:  Allergies  Allergen Reactions   Alpha-Gal Other (See Comments)   Cetuximab Other (See Comments)    Pt denies      Medications Prior to Admission  Medication Sig Dispense Refill   aspirin 81 MG tablet Take 81 mg by mouth daily.     dapagliflozin  propanediol (FARXIGA ) 10 MG TABS tablet Take 1 tablet (10 mg  total) by mouth daily. 90 tablet 4   glimepiride  (AMARYL ) 4 MG tablet Take 1 tablet (4 mg total) by mouth daily with breakfast. 90 tablet 3   glucosamine-chondroitin 500-400 MG tablet Take 1 tablet by mouth daily.     lisinopril -hydrochlorothiazide  (ZESTORETIC ) 10-12.5 MG tablet Take 1 tablet by mouth daily. 90 tablet 3   meloxicam  (MOBIC ) 15 MG tablet Take 1 tablet (15 mg total) by mouth daily. 90 tablet 3   Multiple Vitamin (MULTIVITAMIN) tablet Take 1 tablet by mouth daily.     Omega-3 Fatty Acids (FISH OIL) 600 MG CAPS Take 1,200 mg by mouth daily.     omeprazole  (PRILOSEC) 40 MG capsule Take 1 capsule (40 mg total) by mouth daily. 90 capsule 3   sildenafil  (REVATIO ) 20 MG tablet TAKE 1 TO 3 TABLETS BY MOUTH AS NEEDED 30 tablet 2   simvastatin  (ZOCOR ) 40 MG tablet Take 1 tablet  (40 mg total) by mouth every evening. 90 tablet 3   sitaGLIPtin -metformin  (JANUMET ) 50-1000 MG tablet Take 1 tablet by mouth 2 (two) times daily with a meal. 180 tablet 3     Physical Exam: Blood pressure 116/64, pulse 77, temperature 98.7 F (37.1 C), temperature source Oral, resp. rate 16, height 5\' 5"  (1.651 m), weight 84.5 kg, SpO2 95%. General: Pleasant white male laying on hospital bed, appears stated age, NAD. HEENT: head -normocephalic, atraumatic; Eyes: PERRLA, no conjunctival injection, anicteric sclerae Neck- Trachea is midline;  CV- RRR, normal S1/S2, no M/R/G,no lower extremity edema  Pulm- breathing is non-labored on room air  Abd- soft, protuberant, TTP RUQ with guarding, no perionitis, soft umbilical hernia present GU- deferred  MSK- UE/LE symmetrical, no cyanosis, clubbing, or edema. Neuro- CN II-XII grossly in tact, no paresthesias. Psych- Alert and Oriented x3 with appropriate affect Skin: warm and dry, no rashes or lesions   Results for orders placed or performed during the hospital encounter of 05/21/24 (from the past 48 hours)  Comprehensive metabolic panel     Status: Abnormal   Collection Time: 05/21/24 10:40 PM  Result Value Ref Range   Sodium 134 (L) 135 - 145 mmol/L   Potassium 3.9 3.5 - 5.1 mmol/L   Chloride 100 98 - 111 mmol/L   CO2 23 22 - 32 mmol/L   Glucose, Bld 203 (H) 70 - 99 mg/dL    Comment: Glucose reference range applies only to samples taken after fasting for at least 8 hours.   BUN 24 (H) 8 - 23 mg/dL   Creatinine, Ser 9.56 0.61 - 1.24 mg/dL   Calcium 8.9 8.9 - 21.3 mg/dL   Total Protein 7.1 6.5 - 8.1 g/dL   Albumin 3.1 (L) 3.5 - 5.0 g/dL   AST 25 15 - 41 U/L   ALT 27 0 - 44 U/L   Alkaline Phosphatase 48 38 - 126 U/L   Total Bilirubin 0.7 0.0 - 1.2 mg/dL   GFR, Estimated >08 >65 mL/min    Comment: (NOTE) Calculated using the CKD-EPI Creatinine Equation (2021)    Anion gap 11 5 - 15    Comment: Performed at [facility], [address], [place], [place] [address]  Lipase, blood     Status: None   Collection Time: 05/21/24 10:40 PM  Result Value Ref Range   Lipase 31 11 - 51 U/L    Comment: Performed at [facility], [address], [place], [place] [address]  CBC with Diff     Status: Abnormal  Collection Time: 05/21/24 10:40 PM  Result Value Ref Range   WBC 16.0 (H) 4.0 - 10.5 K/uL   RBC 4.65 4.22 - 5.81 MIL/uL   Hemoglobin 13.4 13.0 - 17.0 g/dL   HCT 82.9 56.2 - 13.0 %   MCV 87.5 80.0 - 100.0 fL   MCH 28.8 26.0 - 34.0 pg   MCHC 32.9 30.0 - 36.0 g/dL   RDW 86.5 78.4 - 69.6 %   Platelets 252 150 - 400 K/uL   nRBC 0.0 0.0 - 0.2 %   Neutrophils Relative % 77 %   Neutro Abs 12.3 (H) 1.7 - 7.7 K/uL   Lymphocytes Relative 14 %   Lymphs Abs 2.2 0.7 - 4.0 K/uL   Monocytes Relative 8 %   Monocytes Absolute 1.3 (H) 0.1 - 1.0 K/uL   Eosinophils Relative 0 %   Eosinophils Absolute 0.0 0.0 - 0.5 K/uL   Basophils Relative 0 %   Basophils Absolute 0.0 0.0 - 0.1 K/uL   Immature Granulocytes 1 %   Abs Immature Granulocytes 0.10 (H) 0.00 - 0.07 K/uL    Comment: Performed at [facility], [address], [place], [place] [address]  Glucose, capillary     Status: Abnormal   Collection Time: 05/21/24 11:59 PM  Result Value Ref Range   Glucose-Capillary 159 (H) 70 - 99 mg/dL    Comment: Glucose reference range applies only to samples taken after fasting for at least 8 hours.  Comprehensive metabolic panel     Status: Abnormal   Collection Time: 05/22/24  4:41 AM  Result Value Ref Range   Sodium 135 135 - 145 mmol/L   Potassium 4.2 3.5 - 5.1 mmol/L    Comment: HEMOLYSIS AT THIS LEVEL MAY AFFECT RESULT   Chloride 103 98 - 111 mmol/L   CO2 22 22 - 32 mmol/L   Glucose, Bld 128 (H) 70 - 99 mg/dL    Comment: Glucose reference range applies only to samples taken after fasting for at least 8 hours.   BUN 23 8 - 23 mg/dL   Creatinine, Ser 4.13 0.61 - 1.24 mg/dL    Calcium 8.5 (L) 8.9 - 10.3 mg/dL   Total Protein 6.9 6.5 - 8.1 g/dL   Albumin 3.0 (L) 3.5 - 5.0 g/dL   AST 29 15 - 41 U/L    Comment: HEMOLYSIS AT THIS LEVEL MAY AFFECT RESULT   ALT 26 0 - 44 U/L    Comment: HEMOLYSIS AT THIS LEVEL MAY AFFECT RESULT   Alkaline Phosphatase 49 38 - 126 U/L   Total Bilirubin 1.1 0.0 - 1.2 mg/dL    Comment: HEMOLYSIS AT THIS LEVEL MAY AFFECT RESULT   GFR, Estimated >60 >60 mL/min    Comment: (NOTE) Calculated using the CKD-EPI Creatinine Equation (2021)    Anion gap 10 5 - 15    Comment: Performed at [facility], [address] [place], [place] [address]  CBC     Status: Abnormal   Collection Time: 05/22/24  4:41 AM  Result Value Ref Range   WBC 15.6 (H) 4.0 - 10.5 K/uL   RBC 4.50 4.22 - 5.81 MIL/uL   Hemoglobin 13.0 13.0 - 17.0 g/dL   HCT 02.7 25.3 - 66.4 %   MCV 92.0 80.0 - 100.0 fL   MCH 28.9 26.0 - 34.0 pg   MCHC 31.4 30.0 - 36.0 g/dL   RDW 40.3 47.4 - 25.9 %   Platelets 250 150 - 400 K/uL  nRBC 0.0 0.0 - 0.2 %    Comment: Performed at [facility], [address], [place], [place] [address]   CT ABDOMEN PELVIS W CONTRAST Result Date: 05/21/2024 CLINICAL DATA:  Right lower quadrant pain. EXAM: CT ABDOMEN AND PELVIS WITH CONTRAST TECHNIQUE: Multidetector CT imaging of the abdomen and pelvis was performed using the standard protocol following bolus administration of intravenous contrast. RADIATION DOSE REDUCTION: This exam was performed according to the departmental dose-optimization program which includes automated exposure control, adjustment of the mA and/or kV according to patient size and/or use of iterative reconstruction technique. CONTRAST:  100mL OMNIPAQUE IOHEXOL 300 MG/ML  SOLN COMPARISON:  None Available. FINDINGS: Lower Chest: No acute findings. Hepatobiliary: No suspicious hepatic masses identified. The gallbladder is distended and contains a small amount of lesion ring sludge or tiny gallstones.  Mild gallbladder wall thickening is seen with hazy inflammatory changes in the pericholecystic fat. These findings are consistent with acute cholecystitis. No No evidence of biliary ductal dilatation. Pancreas:  No mass or inflammatory changes. Spleen: Within normal limits in size and appearance. Adrenals/Urinary Tract: No suspicious masses identified. 8 mm nonobstructing calculus in lower pole of right kidney. No evidence of ureteral calculi or hydronephrosis. Stomach/Bowel: Tiny hiatal hernia. No evidence of obstruction, inflammatory process or abnormal fluid collections. Normal appendix visualized. Vascular/Lymphatic: No pathologically enlarged lymph nodes. No acute vascular findings. Reproductive:  No mass or other significant abnormality. Other:  Tiny umbilical hernia which contains only fat. Musculoskeletal:  No suspicious bone lesions identified. IMPRESSION: Findings consistent with acute cholecystitis. 8 mm nonobstructing right renal calculus. No evidence of ureteral calculi or hydronephrosis. Tiny hiatal hernia and umbilical hernia. These results will be called to the ordering clinician or representative by the Radiologist Assistant, and communication documented in the PACS or [employer]. Electronically Signed   By: Marlyce Sine M.D.   On: 05/21/2024 17:56      Assessment/Plan Acute calculous cholecystitis  73 y/o M with medical history listed below who has had 4-5 days of RUQ abdominal pain associated with fever, nausea, and diarrhea. History, physical exam, and imaging are concerning for acute cholecystitis.   The operative and non-operative management of cholecystitis was discussed with the patient. Risks of surgery including bleeding, infection, damage to surrounding structures, conversion to open, drain placement, bile leak, need for additional procedures, prolonged hospital stay, as well as the risks of general anesthesia were discussed with the patient and he would like to proceed with  surgery. Questions were welcomed and answered.   He has a hypoglossal nerve stimulator that will need to be turned off prior to the procedure. It is controlled by a remote control that is at the bedside with the patient.  FEN - NPO, IVF VTE - SCD's  ID - Zosyn Admit - TRH service  HTN HLD OSA GERD T2DM Alpha-gal allergy   I reviewed nursing notes, ED provider notes, hospitalist notes, last 24 h vitals and pain scores, last 48 h intake and output, last 24 h labs and trends, and last 24 h imaging results.  Charlott Converse, [facility] Surgery 05/22/2024, 9:31 AM Please see Amion for pager number during day hours 7:00am-4:30pm or 7:00am -11:30am on weekends \

## 2024-05-22 NOTE — Progress Notes (Signed)
 Patient currently admitted and labs have been followed with hospitalization. Recommend that he have hospital follow up with repeat CMP and CBC to monitor sodium and wbc once discharged.

## 2024-05-22 NOTE — Progress Notes (Signed)
 Mobility Specialist - Progress Note   05/22/24 1011  Mobility  Activity Ambulated with assistance in hallway  Level of Assistance Standby assist, set-up cues, supervision of patient - no hands on  Assistive Device Front wheel walker  Distance Ambulated (ft) 160 ft  Activity Response Tolerated well  Mobility Referral Yes  Mobility visit 1 Mobility  Mobility Specialist Start Time (ACUTE ONLY) [id number]  Mobility Specialist Stop Time (ACUTE ONLY) 1007  Mobility Specialist Time Calculation (min) (ACUTE ONLY) 9 min   Pt received in bed and agreeable to mobility. C/o pain during session, but still eager to ambulate. No other complaints during session. Pt to bed after session with all needs met.    [facility]

## 2024-05-22 NOTE — Discharge Instructions (Signed)

## 2024-05-22 NOTE — Interval H&P Note (Signed)
 History and Physical Interval Note:  05/22/2024 2:21 PM  Austin Ortiz  has presented today for surgery, with the diagnosis of ACUTE CHOLECYCITIS.  The various methods of treatment have been discussed with the patient and family. After consideration of risks, benefits and other options for treatment, the patient has consented to    Procedure(s): LAPAROSCOPIC CHOLECYSTECTOMY WITH INTRAOPERATIVE CHOLANGIOGRAM (N/A) as a surgical intervention.    The patient's history has been reviewed, patient examined, no change in status, stable for surgery.  I have reviewed the patient's chart and labs.  Questions were answered to the patient's satisfaction.    Oralee Billow, MD [facility] A [facility] practice Office: [phone]   Oralee Billow

## 2024-05-22 NOTE — Transfer of Care (Signed)
 Immediate Anesthesia Transfer of Care Note  Patient: Austin Ortiz  Procedure(s) Performed: LAPAROSCOPIC CHOLECYSTECTOMY  Patient Location: PACU  Anesthesia Type:General  Level of Consciousness: sedated and responds to stimulation  Airway & Oxygen Therapy: Patient Spontanous Breathing and Patient connected to face mask oxygen  Post-op Assessment: Report given to RN  Post vital signs: Reviewed and stable  Last Vitals:  Vitals Value Taken Time  BP 134/64 05/22/24 1627  Temp    Pulse 76 05/22/24 1628  Resp 16 05/22/24 1628  SpO2 100 % 05/22/24 1628  Vitals shown include unfiled device data.  Last Pain:  Vitals:   05/22/24 1252  TempSrc:   PainSc: 0-No pain      Patients Stated Pain Goal: 0 (05/21/24 2127)  Complications: No notable events documented.

## 2024-05-22 NOTE — H&P (View-Only) (Signed)
 Austin Ortiz 06/08/1951  [id number].    Requesting MD: Butch Cashing, MD Chief Complaint/Reason for Consult: cholecystitis   HPI:  Austin Ortiz is a 73 y/o M with a PMH OSA s/p hypoglossal nerve stimulator implantation, GERD, T2DM, HTN, and HLD who presents with abdominal pain. States the pain started Saturday night into Sunday morning (4 days ago). Described as RLQ pain with abdominal distention, then the pain localized to the RUQ. Pain worse with inspiration. Pain was not relieved by antacid medications. Associated with fever, nausea, and diarrhea. Denies any known history of similar attacks. He has a history of esophageal stricture requiring dilation but denies a history of abdominal surgery. Denies any complications under anesthesia for colonoscopy/endoscopy in the past. Denies use of blood thinners, takes baby ASA. Allergies include alpha-gal. Denies tobacco or alcohol use. 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 [place]9 with his church.   ROS: all other systems negative  Review of Systems  All other systems reviewed and are negative.   Family History  Problem Relation Age of Onset   Heart disease Mother    Hypertension Mother    Diabetes type II Paternal Aunt        x2   Nephrolithiasis Maternal Aunt    Breast cancer Maternal Aunt    Heart attack Father        x 2   Diabetes Maternal Aunt    Diabetes Cousin        mat. side   Prostate cancer Brother    Colon cancer Neg Hx    Colon polyps Neg Hx    Esophageal cancer Neg Hx    Rectal cancer Neg Hx    Stomach cancer Neg Hx     Past Medical History:  Diagnosis Date   Arthritis    Diabetes mellitus    Esophageal stricture    Foreign body in subcutaneous tissue    metal off of a machine, epigastric abd. area   GERD (gastroesophageal reflux disease)    Hyperlipidemia    Hypertension    Kidney stones    Obstructive sleep apnea 05/28/2013   NO cpap use per pt   Sleep apnea      Past Surgical History:  Procedure Laterality Date   COLONOSCOPY  10/14/2010   [facility]   DRUG INDUCED ENDOSCOPY N/A 03/31/2021   Procedure: DRUG INDUCED ENDOSCOPY;  Surgeon: Virgina Grills, MD;  Location: [facility];  Service: ENT;  Laterality: N/A;   ESOPHAGUS SURGERY  2010   had esophagus stretched   IMPLANTATION OF HYPOGLOSSAL NERVE STIMULATOR Right 05/12/2021   Procedure: IMPLANTATION OF HYPOGLOSSAL NERVE STIMULATOR;  Surgeon: Virgina Grills, MD;  Location: [facility];  Service: ENT;  Laterality: Right;   KIDNEY STONE SURGERY  1979   neg hx     UPPER GASTROINTESTINAL ENDOSCOPY  04/22/2013   [facility]    Social History:  reports that he has never smoked. He has never used smokeless tobacco. He reports that he does not drink alcohol and does not use drugs.  Allergies:  Allergies  Allergen Reactions   Alpha-Gal Other (See Comments)   Cetuximab Other (See Comments)    Pt denies      Medications Prior to Admission  Medication Sig Dispense Refill   aspirin 81 MG tablet Take 81 mg by mouth daily.     dapagliflozin  propanediol (FARXIGA ) 10 MG TABS tablet Take 1 tablet (10 mg  total) by mouth daily. 90 tablet 4   glimepiride  (AMARYL ) 4 MG tablet Take 1 tablet (4 mg total) by mouth daily with breakfast. 90 tablet 3   glucosamine-chondroitin 500-400 MG tablet Take 1 tablet by mouth daily.     lisinopril -hydrochlorothiazide  (ZESTORETIC ) 10-12.5 MG tablet Take 1 tablet by mouth daily. 90 tablet 3   meloxicam  (MOBIC ) 15 MG tablet Take 1 tablet (15 mg total) by mouth daily. 90 tablet 3   Multiple Vitamin (MULTIVITAMIN) tablet Take 1 tablet by mouth daily.     Omega-3 Fatty Acids (FISH OIL) 600 MG CAPS Take 1,200 mg by mouth daily.     omeprazole  (PRILOSEC) 40 MG capsule Take 1 capsule (40 mg total) by mouth daily. 90 capsule 3   sildenafil  (REVATIO ) 20 MG tablet TAKE 1 TO 3 TABLETS BY MOUTH AS NEEDED 30 tablet 2   simvastatin  (ZOCOR ) 40 MG tablet Take 1 tablet  (40 mg total) by mouth every evening. 90 tablet 3   sitaGLIPtin -metformin  (JANUMET ) 50-1000 MG tablet Take 1 tablet by mouth 2 (two) times daily with a meal. 180 tablet 3     Physical Exam: Blood pressure 116/64, pulse 77, temperature 98.7 F (37.1 C), temperature source Oral, resp. rate 16, height 5\' 5"  (1.651 m), weight 84.5 kg, SpO2 95%. General: Pleasant white male laying on hospital bed, appears stated age, NAD. HEENT: head -normocephalic, atraumatic; Eyes: PERRLA, no conjunctival injection, anicteric sclerae Neck- Trachea is midline;  CV- RRR, normal S1/S2, no M/R/G,no lower extremity edema  Pulm- breathing is non-labored on room air  Abd- soft, protuberant, TTP RUQ with guarding, no perionitis, soft umbilical hernia present GU- deferred  MSK- UE/LE symmetrical, no cyanosis, clubbing, or edema. Neuro- CN II-XII grossly in tact, no paresthesias. Psych- Alert and Oriented x3 with appropriate affect Skin: warm and dry, no rashes or lesions   Results for orders placed or performed during the hospital encounter of 05/21/24 (from the past 48 hours)  Comprehensive metabolic panel     Status: Abnormal   Collection Time: 05/21/24 10:40 PM  Result Value Ref Range   Sodium 134 (L) 135 - 145 mmol/L   Potassium 3.9 3.5 - 5.1 mmol/L   Chloride 100 98 - 111 mmol/L   CO2 23 22 - 32 mmol/L   Glucose, Bld 203 (H) 70 - 99 mg/dL    Comment: Glucose reference range applies only to samples taken after fasting for at least 8 hours.   BUN 24 (H) 8 - 23 mg/dL   Creatinine, Ser 9.56 0.61 - 1.24 mg/dL   Calcium 8.9 8.9 - 21.3 mg/dL   Total Protein 7.1 6.5 - 8.1 g/dL   Albumin 3.1 (L) 3.5 - 5.0 g/dL   AST 25 15 - 41 U/L   ALT 27 0 - 44 U/L   Alkaline Phosphatase 48 38 - 126 U/L   Total Bilirubin 0.7 0.0 - 1.2 mg/dL   GFR, Estimated >08 >65 mL/min    Comment: (NOTE) Calculated using the CKD-EPI Creatinine Equation (2021)    Anion gap 11 5 - 15    Comment: Performed at [facility], [address], [place], [place] [address]  Lipase, blood     Status: None   Collection Time: 05/21/24 10:40 PM  Result Value Ref Range   Lipase 31 11 - 51 U/L    Comment: Performed at [facility], [address], [place], [place] [address]  CBC with Diff     Status: Abnormal  Collection Time: 05/21/24 10:40 PM  Result Value Ref Range   WBC 16.0 (H) 4.0 - 10.5 K/uL   RBC 4.65 4.22 - 5.81 MIL/uL   Hemoglobin 13.4 13.0 - 17.0 g/dL   HCT 82.9 56.2 - 13.0 %   MCV 87.5 80.0 - 100.0 fL   MCH 28.8 26.0 - 34.0 pg   MCHC 32.9 30.0 - 36.0 g/dL   RDW 86.5 78.4 - 69.6 %   Platelets 252 150 - 400 K/uL   nRBC 0.0 0.0 - 0.2 %   Neutrophils Relative % 77 %   Neutro Abs 12.3 (H) 1.7 - 7.7 K/uL   Lymphocytes Relative 14 %   Lymphs Abs 2.2 0.7 - 4.0 K/uL   Monocytes Relative 8 %   Monocytes Absolute 1.3 (H) 0.1 - 1.0 K/uL   Eosinophils Relative 0 %   Eosinophils Absolute 0.0 0.0 - 0.5 K/uL   Basophils Relative 0 %   Basophils Absolute 0.0 0.0 - 0.1 K/uL   Immature Granulocytes 1 %   Abs Immature Granulocytes 0.10 (H) 0.00 - 0.07 K/uL    Comment: Performed at [facility], [address], [place], [place] [address]  Glucose, capillary     Status: Abnormal   Collection Time: 05/21/24 11:59 PM  Result Value Ref Range   Glucose-Capillary 159 (H) 70 - 99 mg/dL    Comment: Glucose reference range applies only to samples taken after fasting for at least 8 hours.  Comprehensive metabolic panel     Status: Abnormal   Collection Time: 05/22/24  4:41 AM  Result Value Ref Range   Sodium 135 135 - 145 mmol/L   Potassium 4.2 3.5 - 5.1 mmol/L    Comment: HEMOLYSIS AT THIS LEVEL MAY AFFECT RESULT   Chloride 103 98 - 111 mmol/L   CO2 22 22 - 32 mmol/L   Glucose, Bld 128 (H) 70 - 99 mg/dL    Comment: Glucose reference range applies only to samples taken after fasting for at least 8 hours.   BUN 23 8 - 23 mg/dL   Creatinine, Ser 4.13 0.61 - 1.24 mg/dL    Calcium 8.5 (L) 8.9 - 10.3 mg/dL   Total Protein 6.9 6.5 - 8.1 g/dL   Albumin 3.0 (L) 3.5 - 5.0 g/dL   AST 29 15 - 41 U/L    Comment: HEMOLYSIS AT THIS LEVEL MAY AFFECT RESULT   ALT 26 0 - 44 U/L    Comment: HEMOLYSIS AT THIS LEVEL MAY AFFECT RESULT   Alkaline Phosphatase 49 38 - 126 U/L   Total Bilirubin 1.1 0.0 - 1.2 mg/dL    Comment: HEMOLYSIS AT THIS LEVEL MAY AFFECT RESULT   GFR, Estimated >60 >60 mL/min    Comment: (NOTE) Calculated using the CKD-EPI Creatinine Equation (2021)    Anion gap 10 5 - 15    Comment: Performed at [facility], [address] [place], [place] [address]  CBC     Status: Abnormal   Collection Time: 05/22/24  4:41 AM  Result Value Ref Range   WBC 15.6 (H) 4.0 - 10.5 K/uL   RBC 4.50 4.22 - 5.81 MIL/uL   Hemoglobin 13.0 13.0 - 17.0 g/dL   HCT 02.7 25.3 - 66.4 %   MCV 92.0 80.0 - 100.0 fL   MCH 28.9 26.0 - 34.0 pg   MCHC 31.4 30.0 - 36.0 g/dL   RDW 40.3 47.4 - 25.9 %   Platelets 250 150 - 400 K/uL  nRBC 0.0 0.0 - 0.2 %    Comment: Performed at [facility], [address], [place], [place] [address]   CT ABDOMEN PELVIS W CONTRAST Result Date: 05/21/2024 CLINICAL DATA:  Right lower quadrant pain. EXAM: CT ABDOMEN AND PELVIS WITH CONTRAST TECHNIQUE: Multidetector CT imaging of the abdomen and pelvis was performed using the standard protocol following bolus administration of intravenous contrast. RADIATION DOSE REDUCTION: This exam was performed according to the departmental dose-optimization program which includes automated exposure control, adjustment of the mA and/or kV according to patient size and/or use of iterative reconstruction technique. CONTRAST:  100mL OMNIPAQUE IOHEXOL 300 MG/ML  SOLN COMPARISON:  None Available. FINDINGS: Lower Chest: No acute findings. Hepatobiliary: No suspicious hepatic masses identified. The gallbladder is distended and contains a small amount of lesion ring sludge or tiny gallstones.  Mild gallbladder wall thickening is seen with hazy inflammatory changes in the pericholecystic fat. These findings are consistent with acute cholecystitis. No No evidence of biliary ductal dilatation. Pancreas:  No mass or inflammatory changes. Spleen: Within normal limits in size and appearance. Adrenals/Urinary Tract: No suspicious masses identified. 8 mm nonobstructing calculus in lower pole of right kidney. No evidence of ureteral calculi or hydronephrosis. Stomach/Bowel: Tiny hiatal hernia. No evidence of obstruction, inflammatory process or abnormal fluid collections. Normal appendix visualized. Vascular/Lymphatic: No pathologically enlarged lymph nodes. No acute vascular findings. Reproductive:  No mass or other significant abnormality. Other:  Tiny umbilical hernia which contains only fat. Musculoskeletal:  No suspicious bone lesions identified. IMPRESSION: Findings consistent with acute cholecystitis. 8 mm nonobstructing right renal calculus. No evidence of ureteral calculi or hydronephrosis. Tiny hiatal hernia and umbilical hernia. These results will be called to the ordering clinician or representative by the Radiologist Assistant, and communication documented in the PACS or [employer]. Electronically Signed   By: Marlyce Sine M.D.   On: 05/21/2024 17:56      Assessment/Plan Acute calculous cholecystitis  73 y/o M with medical history listed below who has had 4-5 days of RUQ abdominal pain associated with fever, nausea, and diarrhea. History, physical exam, and imaging are concerning for acute cholecystitis.   The operative and non-operative management of cholecystitis was discussed with the patient. Risks of surgery including bleeding, infection, damage to surrounding structures, conversion to open, drain placement, bile leak, need for additional procedures, prolonged hospital stay, as well as the risks of general anesthesia were discussed with the patient and he would like to proceed with  surgery. Questions were welcomed and answered.   He has a hypoglossal nerve stimulator that will need to be turned off prior to the procedure. It is controlled by a remote control that is at the bedside with the patient.  FEN - NPO, IVF VTE - SCD's  ID - Zosyn Admit - TRH service  HTN HLD OSA GERD T2DM Alpha-gal allergy   I reviewed nursing notes, ED provider notes, hospitalist notes, last 24 h vitals and pain scores, last 48 h intake and output, last 24 h labs and trends, and last 24 h imaging results.  Charlott Converse, [facility] Surgery 05/22/2024, 9:31 AM Please see Amion for pager number during day hours 7:00am-4:30pm or 7:00am -11:30am on weekends \

## 2024-05-22 NOTE — Anesthesia Procedure Notes (Signed)
 Procedure Name: Intubation Date/Time: 05/22/2024 2:55 PM  Performed by: Darlena Ego, CRNAPre-anesthesia Checklist: Patient identified, Emergency Drugs available, Suction available and Patient being monitored Patient Re-evaluated:Patient Re-evaluated prior to induction Oxygen Delivery Method: Circle System Utilized Preoxygenation: Pre-oxygenation with 100% oxygen Induction Type: IV induction Ventilation: Mask ventilation without difficulty Laryngoscope Size: Miller and 2 Grade View: Grade I Tube type: Oral Tube size: 7.5 mm Number of attempts: 1 Airway Equipment and Method: Stylet and Oral airway Placement Confirmation: ETT inserted through vocal cords under direct vision, positive ETCO2 and breath sounds checked- equal and bilateral Secured at: 22 cm Tube secured with: Tape Dental Injury: Teeth and Oropharynx as per pre-operative assessment

## 2024-05-23 ENCOUNTER — Other Ambulatory Visit: Payer: Self-pay

## 2024-05-23 ENCOUNTER — Encounter (HOSPITAL_COMMUNITY): Payer: Self-pay | Admitting: Surgery

## 2024-05-23 DIAGNOSIS — E1159 Type 2 diabetes mellitus with other circulatory complications: Secondary | ICD-10-CM | POA: Diagnosis not present

## 2024-05-23 DIAGNOSIS — E6609 Other obesity due to excess calories: Secondary | ICD-10-CM | POA: Diagnosis not present

## 2024-05-23 DIAGNOSIS — K810 Acute cholecystitis: Secondary | ICD-10-CM | POA: Diagnosis not present

## 2024-05-23 DIAGNOSIS — Z6831 Body mass index (BMI) 31.0-31.9, adult: Secondary | ICD-10-CM | POA: Diagnosis not present

## 2024-05-23 DIAGNOSIS — I152 Hypertension secondary to endocrine disorders: Secondary | ICD-10-CM | POA: Diagnosis not present

## 2024-05-23 DIAGNOSIS — G4733 Obstructive sleep apnea (adult) (pediatric): Secondary | ICD-10-CM | POA: Diagnosis not present

## 2024-05-23 DIAGNOSIS — N200 Calculus of kidney: Secondary | ICD-10-CM | POA: Diagnosis not present

## 2024-05-23 DIAGNOSIS — E785 Hyperlipidemia, unspecified: Secondary | ICD-10-CM | POA: Diagnosis not present

## 2024-05-23 DIAGNOSIS — E1169 Type 2 diabetes mellitus with other specified complication: Secondary | ICD-10-CM | POA: Diagnosis not present

## 2024-05-23 DIAGNOSIS — E66811 Obesity, class 1: Secondary | ICD-10-CM | POA: Diagnosis not present

## 2024-05-23 LAB — COMPREHENSIVE METABOLIC PANEL WITH GFR
ALT: 41 U/L (ref 0–44)
AST: 45 U/L — ABNORMAL HIGH (ref 15–41)
Albumin: 2.8 g/dL — ABNORMAL LOW (ref 3.5–5.0)
Alkaline Phosphatase: 57 U/L (ref 38–126)
Anion gap: 11 (ref 5–15)
BUN: 28 mg/dL — ABNORMAL HIGH (ref 8–23)
CO2: 20 mmol/L — ABNORMAL LOW (ref 22–32)
Calcium: 8 mg/dL — ABNORMAL LOW (ref 8.9–10.3)
Chloride: 101 mmol/L (ref 98–111)
Creatinine, Ser: 0.8 mg/dL (ref 0.61–1.24)
GFR, Estimated: >60 mL/min (ref >60)
Glucose, Bld: 207 mg/dL — ABNORMAL HIGH (ref 70–99)
Potassium: 4 mmol/L (ref 3.5–5.1)
Sodium: 132 mmol/L — ABNORMAL LOW (ref 135–145)
Total Bilirubin: 0.9 mg/dL (ref 0.0–1.2)
Total Protein: 6.9 g/dL (ref 6.5–8.1)

## 2024-05-23 LAB — CBC
HCT: 43.5 % (ref 39.0–52.0)
Hemoglobin: 13.3 g/dL (ref 13.0–17.0)
MCH: 28.4 pg (ref 26.0–34.0)
MCHC: 30.6 g/dL (ref 30.0–36.0)
MCV: 92.8 fL (ref 80.0–100.0)
Platelets: 278 10*3/uL (ref 150–400)
RBC: 4.69 MIL/uL (ref 4.22–5.81)
RDW: 14.5 % (ref 11.5–15.5)
WBC: 10.8 10*3/uL — ABNORMAL HIGH (ref 4.0–10.5)
nRBC: 0 % (ref 0.0–0.2)

## 2024-05-23 LAB — GLUCOSE, CAPILLARY
Glucose-Capillary: 196 mg/dL — ABNORMAL HIGH (ref 70–99)
Glucose-Capillary: 179 mg/dL — ABNORMAL HIGH (ref 70–99)

## 2024-05-23 NOTE — Plan of Care (Signed)
 Pt received, educated on, and understands AVS paper.  Pt has all belongings, all IV accesses discontinued.

## 2024-05-23 NOTE — TOC Initial Note (Signed)
 Transition of Care [facility]) - Initial/Assessment Note    Patient Details  Name: Austin Ortiz MRN: [id number] Date of Birth: 09-21-1951  Transition of Care [facility]) CM/SW Contact:    Kathryn Parish, RN Phone Number: 05/23/2024, 9:04 AM  Clinical Narrative:                 CODE 44 completed. Home with wife.No TOC needs  Expected Discharge Plan: Home/Self Care Barriers to Discharge: No Barriers Identified   Patient Goals and CMS Choice     Choice offered to / list presented to : NA      Expected Discharge Plan and Services In-house Referral: NA Discharge Planning Services: NA Post Acute Care Choice: NA   Expected Discharge Date: 05/23/24               DME Arranged: N/A DME Agency: NA       HH Arranged: NA HH Agency: NA        Prior Living Arrangements/Services     Patient language and need for interpreter reviewed:: Yes Do you feel safe going back to the place where you live?: Yes      Need for Family Participation in Patient Care: No (Comment) Care giver support system in place?: Yes (comment)   Criminal Activity/Legal Involvement Pertinent to Current Situation/Hospitalization: No - Comment as needed  Activities of Daily Living   ADL Screening (condition at time of admission) Independently performs ADLs?: Yes (appropriate for developmental age) Is the patient deaf or have difficulty hearing?: No Does the patient have difficulty seeing, even when wearing glasses/contacts?: No Does the patient have difficulty concentrating, remembering, or making decisions?: No  Permission Sought/Granted Permission sought to share information with : Case Manager Permission granted to share information with : Yes, Verbal Permission Granted              Emotional Assessment Appearance:: Appears stated age Attitude/Demeanor/Rapport: Engaged Affect (typically observed): Appropriate Orientation: : Oriented to Self, Oriented to Place, Oriented to  Time, Oriented to  Situation Alcohol / Substance Use: Not Applicable Psych Involvement: No (comment)  Admission diagnosis:  Acute cholecystitis [K81.0] Cholecystitis [K81.9] Patient Active Problem List   Diagnosis Date Noted   Cholecystitis 05/22/2024   Acute cholecystitis 05/21/2024   Renal calculus, right 05/21/2024   Diabetes mellitus treated with oral medication (HCC) 06/08/2023   GERD (gastroesophageal reflux disease) 04/02/2015   Hyperlipidemia associated with type 2 diabetes mellitus (HCC)    T2DM (type 2 diabetes mellitus) (HCC) 06/25/2013   Hypertension associated with diabetes (HCC) 06/25/2013   Obstructive sleep apnea 05/28/2013   Other dysphagia 04/18/2013   PCP:  Dettinger, Lucio Sabin, MD Pharmacy:   [facility] [address], [place] - [address] [place] [address] Phone: [phone] Fax: [phone]  [facility] - [place], [place] - [address] [place] [address] Phone: [phone] Fax: [phone]  [id number] - [place], [place] - [address] [place] [address] Phone: [phone] Fax: [phone]  [facility] [place], [place] - [place] [address] [place] [address] Phone: [phone] Fax: [phone]     Social Drivers of Health (SDOH) Social History: SDOH Screenings   Food Insecurity: No Food Insecurity (05/21/2024)  Housing: Low Risk  (05/21/2024)  Transportation Needs: No Transportation Needs (05/21/2024)  Utilities: Not At Risk (05/21/2024)  Alcohol Screen: Low Risk  (03/12/2024)  Depression (PHQ2-9): Low Risk  (04/03/2024)  Financial Resource Strain: Low Risk  (03/12/2024)  Physical Activity: Insufficiently Active (03/12/2024)  Social Connections: Socially Integrated (05/21/2024)  Stress: No Stress Concern Present (03/12/2024)  Tobacco Use: Low Risk  (05/22/2024)  Health Literacy: Adequate Health Literacy (03/12/2024)   SDOH Interventions:      Readmission Risk Interventions     No data to display

## 2024-05-23 NOTE — Progress Notes (Signed)
 Assessment & Plan: POD#1 - status post lap cholecystectomy - 05/22/2024 Dr. Sofia Dunn  Doing well post op, pain controlled  Tolerating diet  No further abx's required  OK for discharge home this morning from surgical standpoint  Will arrange follow up at [facility] office in 2-3 weeks for wound check.        Oralee Billow, MD [facility] practice Office: [phone]        Chief Complaint: Acute cholecystitis  Subjective: Patient in bed, comfortable.  Wife at bedside.  Objective: Vital signs in last 24 hours: Temp:  [97.4 F (36.3 C)-102.8 F (39.3 C)] 97.8 F (36.6 C) (05/29 0402) Pulse Rate:  [65-86] 65 (05/29 0402) Resp:  [13-20] 20 (05/29 0402) BP: (116-145)/(61-73) 118/62 (05/29 0402) SpO2:  [95 %-100 %] 96 % (05/29 0402) Weight:  [84.5 kg] 84.5 kg (05/28 1252) Last BM Date : 05/21/24  Intake/Output from previous day: 05/28 0701 - 05/29 0700 In: 800 [I.V.:800] Out: -  Intake/Output this shift: No intake/output data recorded.  Physical Exam: HEENT - sclerae clear, mucous membranes moist Neck - soft Abdomen - soft, wounds dry and intact with Dermabond Ext - no edema, non-tender  Lab Results:  Recent Labs    05/22/24 0441 05/23/24 0429  WBC 15.6* 10.8*  HGB 13.0 13.3  HCT 41.4 43.5  PLT 250 278   BMET Recent Labs    05/22/24 0441 05/23/24 0429  NA 135 132*  K 4.2 4.0  CL 103 101  CO2 22 20*  GLUCOSE 128* 207*  BUN 23 28*  CREATININE 0.85 0.80  CALCIUM 8.5* 8.0*   PT/INR No results for input(s): "LABPROT", "INR" in the last 72 hours. Comprehensive Metabolic Panel:    Component Value Date/Time   NA 132 (L) 05/23/2024 0429   NA 135 05/22/2024 0441   NA 133 (L) 05/21/2024 1150   NA 140 12/28/2023 1054   K 4.0 05/23/2024 0429   K 4.2 05/22/2024 0441   CL 101 05/23/2024 0429   CL 103 05/22/2024 0441   CO2 20 (L) 05/23/2024 0429   CO2 22 05/22/2024 0441   BUN 28 (H) 05/23/2024 0429   BUN 23 05/22/2024 0441   BUN 21  05/21/2024 1150   BUN 13 12/28/2023 1054   CREATININE 0.80 05/23/2024 0429   CREATININE 0.85 05/22/2024 0441   GLUCOSE 207 (H) 05/23/2024 0429   GLUCOSE 128 (H) 05/22/2024 0441   CALCIUM 8.0 (L) 05/23/2024 0429   CALCIUM 8.5 (L) 05/22/2024 0441   AST 45 (H) 05/23/2024 0429   AST 29 05/22/2024 0441   ALT 41 05/23/2024 0429   ALT 26 05/22/2024 0441   ALKPHOS 57 05/23/2024 0429   ALKPHOS 49 05/22/2024 0441   BILITOT 0.9 05/23/2024 0429   BILITOT 1.1 05/22/2024 0441   BILITOT 0.7 05/21/2024 1150   BILITOT 0.2 12/28/2023 1054   PROT 6.9 05/23/2024 0429   PROT 6.9 05/22/2024 0441   PROT 6.6 05/21/2024 1150   PROT 7.1 12/28/2023 1054   ALBUMIN 2.8 (L) 05/23/2024 0429   ALBUMIN 3.0 (L) 05/22/2024 0441   ALBUMIN 4.0 05/21/2024 1150   ALBUMIN 4.1 12/28/2023 1054    Studies/Results: CT ABDOMEN PELVIS W CONTRAST Result Date: 05/21/2024 CLINICAL DATA:  Right lower quadrant pain. EXAM: CT ABDOMEN AND PELVIS WITH CONTRAST TECHNIQUE: Multidetector CT imaging of the abdomen and pelvis was performed using the standard protocol following bolus administration of intravenous contrast. RADIATION DOSE REDUCTION: This exam was performed according to the departmental  dose-optimization program which includes automated exposure control, adjustment of the mA and/or kV according to patient size and/or use of iterative reconstruction technique. CONTRAST:  100mL OMNIPAQUE IOHEXOL 300 MG/ML  SOLN COMPARISON:  None Available. FINDINGS: Lower Chest: No acute findings. Hepatobiliary: No suspicious hepatic masses identified. The gallbladder is distended and contains a small amount of lesion ring sludge or tiny gallstones. Mild gallbladder wall thickening is seen with hazy inflammatory changes in the pericholecystic fat. These findings are consistent with acute cholecystitis. No No evidence of biliary ductal dilatation. Pancreas:  No mass or inflammatory changes. Spleen: Within normal limits in size and appearance.  Adrenals/Urinary Tract: No suspicious masses identified. 8 mm nonobstructing calculus in lower pole of right kidney. No evidence of ureteral calculi or hydronephrosis. Stomach/Bowel: Tiny hiatal hernia. No evidence of obstruction, inflammatory process or abnormal fluid collections. Normal appendix visualized. Vascular/Lymphatic: No pathologically enlarged lymph nodes. No acute vascular findings. Reproductive:  No mass or other significant abnormality. Other:  Tiny umbilical hernia which contains only fat. Musculoskeletal:  No suspicious bone lesions identified. IMPRESSION: Findings consistent with acute cholecystitis. 8 mm nonobstructing right renal calculus. No evidence of ureteral calculi or hydronephrosis. Tiny hiatal hernia and umbilical hernia. These results will be called to the ordering clinician or representative by the Radiologist Assistant, and communication documented in the PACS or [employer]. Electronically Signed   By: Marlyce Sine M.D.   On: 05/21/2024 17:56      Oralee Billow 05/23/2024  Patient ID: Austin Ortiz, male   DOB: 09/10/1951, 73 y.o.   MRN: [id number]

## 2024-05-23 NOTE — Care Management Obs Status (Signed)
 MEDICARE OBSERVATION STATUS NOTIFICATION   Patient Details  Name: Austin Ortiz MRN: [id number] Date of Birth: 1951/01/03   Medicare Observation Status Notification Given:  Yes    Kathryn Parish, RN 05/23/2024, 9:08 AM

## 2024-05-23 NOTE — Discharge Summary (Signed)
 Physician Discharge Summary   Patient: Austin Ortiz MRN: [id number] DOB: December 10, 1951  Admit date:     05/21/2024  Discharge date: 05/23/24  Discharge Physician: Aisha Hove   PCP: Dettinger, Lucio Sabin, MD   Recommendations at discharge:    PCP follow up in 1 week. General surgery clinic follow up as scheduled.  Discharge Diagnoses: Principal Problem:   Acute cholecystitis Active Problems:   T2DM (type 2 diabetes mellitus) (HCC)   Obstructive sleep apnea   Hypertension associated with diabetes (HCC)   Hyperlipidemia associated with type 2 diabetes mellitus (HCC)   Renal calculus, right   Cholecystitis  Resolved Problems:   * No resolved hospital problems. *  Hospital Course: Austin Ortiz is a 73 y.o. male with medical history significant for T2DM, HTN, HLD, OSA who is admitted with acute cholecystitis. Patient has been experiencing right-sided abdominal pain associated with nausea, loose stools, and fevers at home for the last 2 days. He was seen by his PCP earlier today. He was sent for outpatient CT abdomen/pelvis with contrast which showed findings consistent with acute cholecystitis. He was notified of results and recommended to come to the ED for further evaluation.   Assessment and Plan: Acute cholecystitis: Outpatient CT A/P showed findings consistent with acute cholecystitis.  No CBD dilation.  No comment on gallstones.  LFTs WNL.  Patient with leukocytosis otherwise hemodynamically stable. General Surgery performed Lap cholecystectomy 05/22/24, he tolerated the procedure well. Able to tolerate diet. No more antibiotics recommended. He will need to follow with PCP, surgery team as scheduled.   Type 2 diabetes: Home meds Farxiga , Amaryl , Janumet  resumed.    Hypertension: Home lisinopril -HCTZ resumed.   Right renal calculus: 8 mm nonobstructing right renal calculus seen on CT imaging.  No evidence of ureteral calculi or hydronephrosis.  Renal function  stable.  Follow-up with urology as an outpatient.   Hyperlipidemia: Statin resumed.   OSA: CPAP intolerant.  Uses inspire at home.   Obesity Class I- Diet, exercise and weight reduction advised.        Consultants: General surgery Procedures performed: Lap cholecystectomy, primary repair umbilical hernia  Disposition: Home Diet recommendation:  Discharge Diet Orders (From admission, onward)     Start     Ordered   05/23/24 0000  Diet - low sodium heart healthy        05/23/24 0845   05/23/24 0000  Diet Carb Modified        05/23/24 0845           Cardiac and Carb modified diet DISCHARGE MEDICATION: Allergies as of 05/23/2024       Reactions   Alpha-gal Other (See Comments)   Cetuximab Other (See Comments)   Pt denies         Medication List     TAKE these medications    aspirin 81 MG tablet Take 81 mg by mouth daily.   dapagliflozin  propanediol 10 MG Tabs tablet Commonly known as: Farxiga  Take 1 tablet (10 mg total) by mouth daily.   Fish Oil 600 MG Caps Take 1,200 mg by mouth daily.   glimepiride  4 MG tablet Commonly known as: AMARYL  Take 1 tablet (4 mg total) by mouth daily with breakfast.   glucosamine-chondroitin 500-400 MG tablet Take 1 tablet by mouth daily.   Janumet  50-1000 MG tablet Generic drug: sitaGLIPtin -metformin  Take 1 tablet by mouth 2 (two) times daily with a meal.   lisinopril -hydrochlorothiazide  10-12.5 MG tablet Commonly known as: ZESTORETIC  Take 1 tablet  by mouth daily.   meloxicam  15 MG tablet Commonly known as: MOBIC  Take 1 tablet (15 mg total) by mouth daily.   multivitamin tablet Take 1 tablet by mouth daily.   omeprazole  40 MG capsule Commonly known as: PRILOSEC Take 1 capsule (40 mg total) by mouth daily.   sildenafil  20 MG tablet Commonly known as: REVATIO  TAKE 1 TO 3 TABLETS BY MOUTH AS NEEDED   simvastatin  40 MG tablet Commonly known as: ZOCOR  Take 1 tablet (40 mg total) by mouth every evening.         Follow-up Information     Maczis, Puja Gosai, PA-C Follow up on 06/18/2024.   Specialty: General Surgery Why: 6/24 at 2:00. Please bring a copy of your photo ID, insurance card and arrive 30 minutes prior to your appointment for paperwork. Contact information: [address] [place] [address] [phone]         Dettinger, Lucio Sabin, MD Follow up in 1 week(s).   Specialties: Family Medicine, Cardiology Contact information: [address] [place] [address] [phone]                Discharge Exam: Cleavon Curls Weights   05/21/24 2329 05/22/24 1252  Weight: 84.5 kg 84.5 kg      05/23/2024    4:02 AM 05/22/2024    7:37 PM 05/22/2024    6:12 PM  Vitals with BMI  Systolic 118 119 644  Diastolic 62 65 69  Pulse 65 71 73   General - Elderly Caucasian obese male, no apparent distress HEENT - PERRLA, EOMI, atraumatic head, non tender sinuses. Lung - Clear, basal rales, no rhonchi, wheezes. Heart - S1, S2 heard, no murmurs, rubs, trace pedal edema. Abdomen - Soft, no RUQ tenderness, bowel sounds good Neuro - Alert, awake and oriented x 3, non focal exam. Skin - Warm and dry.  Condition at discharge: stable  The results of significant diagnostics from this hospitalization (including imaging, microbiology, ancillary and laboratory) are listed below for reference.   Imaging Studies: CT ABDOMEN PELVIS W CONTRAST Result Date: 05/21/2024 CLINICAL DATA:  Right lower quadrant pain. EXAM: CT ABDOMEN AND PELVIS WITH CONTRAST TECHNIQUE: Multidetector CT imaging of the abdomen and pelvis was performed using the standard protocol following bolus administration of intravenous contrast. RADIATION DOSE REDUCTION: This exam was performed according to the departmental dose-optimization program which includes automated exposure control, adjustment of the mA and/or kV according to patient size and/or use of iterative reconstruction technique. CONTRAST:  OMNIPAQUE   IOHEXOL  300 MG/ML  SOLN COMPARISON:  None Available. FINDINGS: Lower Chest: No acute findings. Hepatobiliary: No suspicious hepatic masses identified. The gallbladder is distended and contains a small amount of lesion ring sludge or tiny gallstones. Mild gallbladder wall thickening is seen with hazy inflammatory changes in the pericholecystic fat. These findings are consistent with acute cholecystitis. No No evidence of biliary ductal dilatation. Pancreas:  No mass or inflammatory changes. Spleen: Within normal limits in size and appearance. Adrenals/Urinary Tract: No suspicious masses identified. 8 mm nonobstructing calculus in lower pole of right kidney. No evidence of ureteral calculi or hydronephrosis. Stomach/Bowel: Tiny hiatal hernia. No evidence of obstruction, inflammatory process or abnormal fluid collections. Normal appendix visualized. Vascular/Lymphatic: No pathologically enlarged lymph nodes. No acute vascular findings. Reproductive:  No mass or other significant abnormality. Other:  Tiny umbilical hernia which contains only fat. Musculoskeletal:  No suspicious bone lesions identified. IMPRESSION: Findings consistent with acute cholecystitis. 8 mm nonobstructing right renal calculus.  No evidence of ureteral calculi or hydronephrosis. Tiny hiatal hernia and umbilical hernia. These results will be called to the ordering clinician or representative by the Radiologist Assistant, and communication documented in the PACS or [employer]. Electronically Signed   By: Marlyce Sine M.D.   On: 05/21/2024 17:56    Microbiology: Results for orders placed or performed in visit on 05/21/24  Microscopic Examination     Status: None   Collection Time: 05/21/24 11:14 AM   Urine  Result Value Ref Range Status   WBC, UA 0-5 0 - 5 /hpf Final   RBC, Urine 0-2 0 - 2 /hpf Final   Epithelial Cells (non renal) 0-10 0 - 10 /hpf Final   Renal Epithel, UA None seen None seen /hpf Final   Bacteria, UA None seen None  seen/Few Final   Yeast, UA None seen None seen Final  Urine Culture     Status: None   Collection Time: 05/21/24 11:19 AM   Specimen: Urine   UR  Result Value Ref Range Status   Urine Culture, Routine Final report  Final   Organism ID, Bacteria No growth  Final    Labs: CBC: Recent Labs  Lab 05/21/24 1150 05/21/24 2240 05/22/24 0441 05/23/24 0429  WBC 17.8* 16.0* 15.6* 10.8*  NEUTROABS 14.3* 12.3*  --   --   HGB 13.9 13.4 13.0 13.3  HCT 43.1 40.7 41.4 43.5  MCV 90 87.5 92.0 92.8  PLT 267 252 250 278   Basic Metabolic Panel: Recent Labs  Lab 05/21/24 1150 05/21/24 1728 05/21/24 2240 05/22/24 0441 05/23/24 0429  NA 133*  --  134* 135 132*  K 4.4  --  3.9 4.2 4.0  CL 97  --  100 103 101  CO2 20  --  23 22 20*  GLUCOSE 286*  --  203* 128* 207*  BUN 21  --  24* 23 28*  CREATININE 1.07 1.10 0.87 0.85 0.80  CALCIUM 8.8  --  8.9 8.5* 8.0*   Liver Function Tests: Recent Labs  Lab 05/21/24 1150 05/21/24 2240 05/22/24 0441 05/23/24 0429  AST 21 25 29  45*  ALT 23 27 26  41  ALKPHOS 61 48 49 57  BILITOT 0.7 0.7 1.1 0.9  PROT 6.6 7.1 6.9 6.9  ALBUMIN 4.0 3.1* 3.0* 2.8*   CBG: Recent Labs  Lab 05/22/24 1810 05/22/24 1937 05/22/24 2304 05/23/24 0358 05/23/24 0806  GLUCAP 173* 161* 245* 196* 179*    Discharge time spent: 34 minutes.  Signed: Aisha Hove, MD [facility] 05/23/2024

## 2024-05-23 NOTE — Care Management CC44 (Signed)
 Condition Code 44 Documentation Completed  Patient Details  Name: Austin Ortiz MRN: [id number] Date of Birth: 1951-04-08   Condition Code 44 given:  Yes Patient signature on Condition Code 44 notice:  Yes Documentation of 2 MD's agreement:  Yes Code 44 added to claim:  Yes    Kathryn Parish, RN 05/23/2024, 9:09 AM

## 2024-05-24 ENCOUNTER — Telehealth: Payer: Self-pay

## 2024-05-24 LAB — SURGICAL PATHOLOGY

## 2024-05-24 NOTE — Transitions of Care (Post Inpatient/ED Visit) (Signed)
 05/24/2024  Name: Austin Ortiz MRN: [id number] DOB: 21-Apr-1951  Today's TOC FU Call Status: Today's TOC FU Call Status:: Successful TOC FU Call Completed TOC FU Call Complete Date: 05/24/24 Patient's Name and Date of Birth confirmed.  Transition Care Management Follow-up Telephone Call Date of Discharge: 05/23/24 Discharge Facility: [facility]) Type of Discharge: Inpatient Admission Primary Inpatient Discharge Diagnosis:: cholecystitis How have you been since you were released from the hospital?: Better Any questions or concerns?: No  Items Reviewed: Did you receive and understand the discharge instructions provided?: Yes Medications obtained,verified, and reconciled?: Yes (Medications Reviewed) Any new allergies since your discharge?: No Do you have support at home?: Yes People in Home [RPT]: spouse  Medications Reviewed Today: Medications Reviewed Today     Reviewed by Darrall Ellison, LPN (Licensed Practical Nurse) on 05/24/24 at 1021  Med List Status: <None>   Medication Order Taking? Sig Documenting Provider Last Dose Status Informant  aspirin 81 MG tablet [id number] No Take 81 mg by mouth daily. [provider] 05/21/2024 Morning Active Spouse/Significant Other  dapagliflozin  propanediol (FARXIGA ) 10 MG TABS tablet [id number] No Take 1 tablet (10 mg total) by mouth daily. Dettinger, Lucio Sabin, MD 05/21/2024 Morning Active Spouse/Significant Other  glimepiride  (AMARYL ) 4 MG tablet [id number] No Take 1 tablet (4 mg total) by mouth daily with breakfast. Dettinger, Lucio Sabin, MD 05/21/2024 Morning Active Spouse/Significant Other  glucosamine-chondroitin 500-400 MG tablet [id number] No Take 1 tablet by mouth daily. [provider] 05/21/2024 Morning Active Spouse/Significant Other  lisinopril -hydrochlorothiazide  (ZESTORETIC ) 10-12.5 MG tablet [id number] No Take 1 tablet by mouth daily. Dettinger, Lucio Sabin, MD 05/21/2024 Morning Active Spouse/Significant Other   meloxicam  (MOBIC ) 15 MG tablet [id number] No Take 1 tablet (15 mg total) by mouth daily. Dettinger, Lucio Sabin, MD 05/21/2024 Morning Active Spouse/Significant Other  Multiple Vitamin (MULTIVITAMIN) tablet [id number] No Take 1 tablet by mouth daily. [provider] 05/21/2024 Morning Active Spouse/Significant Other  Omega-3 Fatty Acids (FISH OIL) 600 MG CAPS [id number] No Take 1,200 mg by mouth daily. [provider] 05/21/2024 Active Spouse/Significant Other  omeprazole  (PRILOSEC) 40 MG capsule [id number] No Take 1 capsule (40 mg total) by mouth daily. Dettinger, Lucio Sabin, MD 05/20/2024 Active Spouse/Significant Other  sildenafil  (REVATIO ) 20 MG tablet [id number] No TAKE 1 TO 3 TABLETS BY MOUTH AS NEEDED Dettinger, Lucio Sabin, MD Unknown Active Spouse/Significant Other  simvastatin  (ZOCOR ) 40 MG tablet [id number] No Take 1 tablet (40 mg total) by mouth every evening. Dettinger, Lucio Sabin, MD 05/20/2024 Evening Active Spouse/Significant Other  sitaGLIPtin -metformin  (JANUMET ) 50-1000 MG tablet [id number] No Take 1 tablet by mouth 2 (two) times daily with a meal. Dettinger, Lucio Sabin, MD 05/21/2024 Morning Active Spouse/Significant Other            Home Care and Equipment/Supplies: Were Home Health Services Ordered?: NA Any new equipment or medical supplies ordered?: NA  Functional Questionnaire: Do you need assistance with bathing/showering or dressing?: No Do you need assistance with meal preparation?: No Do you need assistance with eating?: No Do you have difficulty maintaining continence: No Do you need assistance with getting out of bed/getting out of a chair/moving?: No Do you have difficulty managing or taking your medications?: No  Follow up appointments reviewed: PCP Follow-up appointment confirmed?: Yes Date of PCP follow-up appointment?: 05/30/24 Follow-up Provider: [facility] Follow-up appointment confirmed?: Yes Date of Specialist follow-up appointment?:  06/18/24 Follow-Up Specialty Provider:: surgeon Do you need transportation to your follow-up appointment?: No Do you understand care options if your  condition(s) worsen?: Yes-patient verbalized understanding    SIGNATURE Darrall Ellison, LPN [facility] Nurse Health Advisor Direct Dial [phone]

## 2024-05-30 ENCOUNTER — Ambulatory Visit (INDEPENDENT_AMBULATORY_CARE_PROVIDER_SITE_OTHER): Admitting: Family Medicine

## 2024-05-30 ENCOUNTER — Encounter: Payer: Self-pay | Admitting: Family Medicine

## 2024-05-30 VITALS — BP 117/72 | HR 80 | Temp 98.3°F | Ht 65.0 in | Wt 182.0 lb

## 2024-05-30 DIAGNOSIS — Z9049 Acquired absence of other specified parts of digestive tract: Secondary | ICD-10-CM | POA: Diagnosis not present

## 2024-05-30 DIAGNOSIS — N200 Calculus of kidney: Secondary | ICD-10-CM | POA: Diagnosis not present

## 2024-05-30 MED ORDER — TAMSULOSIN HCL 0.4 MG PO CAPS: 0.4000 mg | ORAL_CAPSULE | Freq: Every day | ORAL | 3 refills | Status: AC

## 2024-05-30 NOTE — Progress Notes (Signed)
 Subjective:  Patient ID: Austin Ortiz, male    DOB: 21-Apr-1951, 73 y.o.   MRN: [id number]  Patient Care Team: Dettinger, Lucio Sabin, MD as PCP - General (Family Medicine) Delilah Fend, [facility] as Pharmacist (Family Medicine) Woodson He, MD as Consulting Physician (Sleep Medicine)   Chief Complaint:  Hospitalization Follow-up (Cholecystitis w/ cholecystectomy)   HPI: Austin Ortiz is a 73 y.o. male presenting on 05/30/2024 for Hospitalization Follow-up (Cholecystitis w/ cholecystectomy)  HPI Patient presents today for follow up of cholecystitis s/p cholecystectomy. PMH significant for T2DM, HTN, HLD, OSA  He was evaluated in office on 05/21/24 and sent for stat CT, called back to [facility] for further evaluation. He was provided IV zosyn  and pain management. He completed cholecystectomy on 5/28. 8 mm nonobstructing right renal calculus was seen on CT imaging with no evidence of ureteral calculi or hydronephrosis. His renal function was stable and he was recommended to follow-up with urology as an outpatient. Since discharge states that he had low grade fever of 99 for the first few days. Has not had fever in the last few days. States that he is advancing diet and tolerating well. He is watching what he eats and avoiding high fat foods. Denies nausea, vomiting, pain.   Relevant past medical, surgical, family, and social history reviewed and updated as indicated.  Allergies and medications reviewed and updated. Data reviewed: Chart in Epic.   Past Medical History:  Diagnosis Date   Arthritis    Diabetes mellitus    Esophageal stricture    Foreign body in subcutaneous tissue    metal off of a machine, epigastric abd. area   GERD (gastroesophageal reflux disease)    Hyperlipidemia    Hypertension    Kidney stones    Obstructive sleep apnea 05/28/2013   NO cpap use per pt   Sleep apnea     Past Surgical History:  Procedure Laterality Date   CHOLECYSTECTOMY N/A 05/22/2024    Procedure: LAPAROSCOPIC CHOLECYSTECTOMY;  Surgeon: Oralee Billow, MD;  Location: [facility];  Service: General;  Laterality: N/A;   COLONOSCOPY  10/14/2010   [place]   DRUG INDUCED ENDOSCOPY N/A 03/31/2021   Procedure: DRUG INDUCED ENDOSCOPY;  Surgeon: Virgina Grills, MD;  Location: [facility];  Service: ENT;  Laterality: N/A;   ESOPHAGUS SURGERY  2010   had esophagus stretched   IMPLANTATION OF HYPOGLOSSAL NERVE STIMULATOR Right 05/12/2021   Procedure: IMPLANTATION OF HYPOGLOSSAL NERVE STIMULATOR;  Surgeon: Virgina Grills, MD;  Location: [facility];  Service: ENT;  Laterality: Right;   KIDNEY STONE SURGERY  1979   neg hx     UPPER GASTROINTESTINAL ENDOSCOPY  04/22/2013   [place] [facility]    Social History   Socioeconomic History   Marital status: Married    Spouse name: [name]   Number of children: 3   Years of education: 12   Highest education level: High school graduate  Occupational History   Occupation: self employed- farmer  Tobacco Use   Smoking status: Never   Smokeless tobacco: Never  Vaping Use   Vaping status: Never Used  Substance and Sexual Activity   Alcohol use: No   Drug use: No   Sexual activity: Yes  Other Topics Concern   Not on file  Social History Narrative   Not on file   Social Drivers of Health   Financial Resource Strain: Low Risk  (03/12/2024)   Overall Financial Resource Strain (CARDIA)  Difficulty of Paying Living Expenses: Not hard at all  Food Insecurity: No Food Insecurity (05/21/2024)   Hunger Vital Sign    Worried About Running Out of Food in the Last Year: Never true    Ran Out of Food in the Last Year: Never true  Transportation Needs: No Transportation Needs (05/21/2024)   PRAPARE - Administrator, Civil Service (Medical): No    Lack of Transportation (Non-Medical): No  Physical Activity: Insufficiently Active (03/12/2024)   Exercise Vital Sign    Days of Exercise per Week: 3 days    Minutes of Exercise per  Session: 30 min  Stress: No Stress Concern Present (03/12/2024)   [employer] of Occupational Health - Occupational Stress Questionnaire    Feeling of Stress : Not at all  Social Connections: Socially Integrated (05/21/2024)   Social Connection and Isolation Panel [NHANES]    Frequency of Communication with Friends and Family: More than three times a week    Frequency of Social Gatherings with Friends and Family: Three times a week    Attends Religious Services: More than 4 times per year    Active Member of Clubs or Organizations: Yes    Attends Banker Meetings: More than 4 times per year    Marital Status: Married  Catering manager Violence: Not At Risk (05/21/2024)   Humiliation, Afraid, Rape, and Kick questionnaire    Fear of Current or Ex-Partner: No    Emotionally Abused: No    Physically Abused: No    Sexually Abused: No    Outpatient Encounter Medications as of 05/30/2024  Medication Sig   aspirin 81 MG tablet Take 81 mg by mouth daily.   dapagliflozin  propanediol (FARXIGA ) 10 MG TABS tablet Take 1 tablet (10 mg total) by mouth daily.   glimepiride  (AMARYL ) 4 MG tablet Take 1 tablet (4 mg total) by mouth daily with breakfast.   glucosamine-chondroitin 500-400 MG tablet Take 1 tablet by mouth daily.   lisinopril -hydrochlorothiazide  (ZESTORETIC ) 10-12.5 MG tablet Take 1 tablet by mouth daily.   meloxicam  (MOBIC ) 15 MG tablet Take 1 tablet (15 mg total) by mouth daily.   Multiple Vitamin (MULTIVITAMIN) tablet Take 1 tablet by mouth daily.   Omega-3 Fatty Acids (FISH OIL) 600 MG CAPS Take 1,200 mg by mouth daily.   omeprazole  (PRILOSEC) 40 MG capsule Take 1 capsule (40 mg total) by mouth daily.   sildenafil  (REVATIO ) 20 MG tablet TAKE 1 TO 3 TABLETS BY MOUTH AS NEEDED   simvastatin  (ZOCOR ) 40 MG tablet Take 1 tablet (40 mg total) by mouth every evening.   sitaGLIPtin -metformin  (JANUMET ) 50-1000 MG tablet Take 1 tablet by mouth 2 (two) times daily with a meal.    No facility-administered encounter medications on file as of 05/30/2024.    Allergies  Allergen Reactions   Alpha-Gal Other (See Comments)   Cetuximab Other (See Comments)    Pt denies      Review of Systems As per HPI  Objective:  BP 117/72   Pulse 80   Temp 98.3 F (36.8 C)   Ht 5\' 5"  (1.651 m)   Wt 182 lb (82.6 kg)   SpO2 97%   BMI 30.29 kg/m    Wt Readings from Last 3 Encounters:  05/30/24 182 lb (82.6 kg)  05/22/24 186 lb 6.4 oz (84.5 kg)  05/21/24 186 lb 6.4 oz (84.6 kg)   Physical Exam Constitutional:      General: He is awake. He is not in acute  distress.    Appearance: Normal appearance. He is well-developed and well-groomed. He is obese. He is not ill-appearing, toxic-appearing or diaphoretic.  Cardiovascular:     Rate and Rhythm: Normal rate and regular rhythm.     Heart sounds: Normal heart sounds. No murmur heard.    No gallop.  Pulmonary:     Effort: Pulmonary effort is normal. No respiratory distress.     Breath sounds: Normal breath sounds. No stridor. No wheezing, rhonchi or rales.  Musculoskeletal:     Cervical back: Full passive range of motion without pain and neck supple.  Skin:    General: Skin is warm.     Capillary Refill: Capillary refill takes less than 2 seconds.          Comments: Healing incisions along abdomen from surgery   Neurological:     General: No focal deficit present.     Mental Status: He is alert, oriented to person, place, and time and easily aroused. Mental status is at baseline.     GCS: GCS eye subscore is 4. GCS verbal subscore is 5. GCS motor subscore is 6.     Motor: No weakness.  Psychiatric:        Attention and Perception: Attention and perception normal.        Mood and Affect: Mood and affect normal.        Speech: Speech normal.        Behavior: Behavior normal. Behavior is cooperative.        Thought Content: Thought content normal. Thought content does not include homicidal or suicidal ideation.  Thought content does not include homicidal or suicidal plan.        Cognition and Memory: Cognition and memory normal.        Judgment: Judgment normal.     Results for orders placed or performed during the hospital encounter of 05/21/24  Comprehensive metabolic panel   Collection Time: 05/21/24 10:40 PM  Result Value Ref Range   Sodium 134 (L) 135 - 145 mmol/L   Potassium 3.9 3.5 - 5.1 mmol/L   Chloride 100 98 - 111 mmol/L   CO2 23 22 - 32 mmol/L   Glucose, Bld 203 (H) 70 - 99 mg/dL   BUN 24 (H) 8 - 23 mg/dL   Creatinine, Ser 1.47 0.61 - 1.24 mg/dL   Calcium 8.9 8.9 - 82.9 mg/dL   Total Protein 7.1 6.5 - 8.1 g/dL   Albumin 3.1 (L) 3.5 - 5.0 g/dL   AST 25 15 - 41 U/L   ALT 27 0 - 44 U/L   Alkaline Phosphatase 48 38 - 126 U/L   Total Bilirubin 0.7 0.0 - 1.2 mg/dL   GFR, Estimated >56 >21 mL/min   Anion gap 11 5 - 15  Lipase, blood   Collection Time: 05/21/24 10:40 PM  Result Value Ref Range   Lipase 31 11 - 51 U/L  CBC with Diff   Collection Time: 05/21/24 10:40 PM  Result Value Ref Range   WBC 16.0 (H) 4.0 - 10.5 K/uL   RBC 4.65 4.22 - 5.81 MIL/uL   Hemoglobin 13.4 13.0 - 17.0 g/dL   HCT 30.8 65.7 - 84.6 %   MCV 87.5 80.0 - 100.0 fL   MCH 28.8 26.0 - 34.0 pg   MCHC 32.9 30.0 - 36.0 g/dL   RDW 96.2 95.2 - 84.1 %   Platelets 252 150 - 400 K/uL   nRBC 0.0 0.0 - 0.2 %   Neutrophils  Relative % 77 %   Neutro Abs 12.3 (H) 1.7 - 7.7 K/uL   Lymphocytes Relative 14 %   Lymphs Abs 2.2 0.7 - 4.0 K/uL   Monocytes Relative 8 %   Monocytes Absolute 1.3 (H) 0.1 - 1.0 K/uL   Eosinophils Relative 0 %   Eosinophils Absolute 0.0 0.0 - 0.5 K/uL   Basophils Relative 0 %   Basophils Absolute 0.0 0.0 - 0.1 K/uL   Immature Granulocytes 1 %   Abs Immature Granulocytes 0.10 (H) 0.00 - 0.07 K/uL  Glucose, capillary   Collection Time: 05/21/24 11:59 PM  Result Value Ref Range   Glucose-Capillary 159 (H) 70 - 99 mg/dL  Comprehensive metabolic panel   Collection Time: 05/22/24  4:41 AM   Result Value Ref Range   Sodium 135 135 - 145 mmol/L   Potassium 4.2 3.5 - 5.1 mmol/L   Chloride 103 98 - 111 mmol/L   CO2 22 22 - 32 mmol/L   Glucose, Bld 128 (H) 70 - 99 mg/dL   BUN 23 8 - 23 mg/dL   Creatinine, Ser 1.91 0.61 - 1.24 mg/dL   Calcium 8.5 (L) 8.9 - 10.3 mg/dL   Total Protein 6.9 6.5 - 8.1 g/dL   Albumin 3.0 (L) 3.5 - 5.0 g/dL   AST 29 15 - 41 U/L   ALT 26 0 - 44 U/L   Alkaline Phosphatase 49 38 - 126 U/L   Total Bilirubin 1.1 0.0 - 1.2 mg/dL   GFR, Estimated >47 >82 mL/min   Anion gap 10 5 - 15  CBC   Collection Time: 05/22/24  4:41 AM  Result Value Ref Range   WBC 15.6 (H) 4.0 - 10.5 K/uL   RBC 4.50 4.22 - 5.81 MIL/uL   Hemoglobin 13.0 13.0 - 17.0 g/dL   HCT 95.6 21.3 - 08.6 %   MCV 92.0 80.0 - 100.0 fL   MCH 28.9 26.0 - 34.0 pg   MCHC 31.4 30.0 - 36.0 g/dL   RDW 57.8 46.9 - 62.9 %   Platelets 250 150 - 400 K/uL   nRBC 0.0 0.0 - 0.2 %  Surgical pathology   Collection Time: 05/22/24  2:43 PM  Result Value Ref Range   SURGICAL PATHOLOGY      SURGICAL PATHOLOGY CASE: [id number] PATIENT: Terris Devin Surgical Pathology Report     Clinical History: acute cholecystitis     FINAL MICROSCOPIC DIAGNOSIS:  A. GALLBLADDER: - Acute and chronic cholecystitis - Cholelithiasis     GROSS DESCRIPTION:  Size/?Intact: 9.8 x 4.6 x 2.4 cm intact gallbladder, received fresh Serosal surface: Smooth and bile-stained Mucosa/Wall: The mucosa is tan-red to bile-stained and the wall measures up to 0.5 cm in thickness. Contents: The gallbladder contains bile and numerous green calculi measuring less than 0.1 to 0.2 cm. Cystic duct: Patent Block Summary: 1 block submitted to include the cystic duct margin (GRP 05/23/2024)    Final Diagnosis performed by Lee Public, MD.   Electronically signed 05/24/2024 Technical and / or Professional components performed at [facility], [address], [place], [place] [address].   Immunohistochemistry Technical component (if applicable) was performed  at [facility]. [address], [place], [place] [address].   IMMUNOHISTOCHEMISTRY DISCLAIMER (if applicable): Some of these immunohistochemical stains may have been developed and the performance characteristics determine by [facility]. Some may not have been cleared or approved by the U.S. Food and Drug Administration. The FDA has determined that such clearance  or approval is not necessary. This test is used for clinical purposes. It should not be regarded as investigational or for research. This laboratory is certified under the Clinical Laboratory Improvement Amendments of 1988 (CLIA-88) as qualified to perform high complexity clinical laboratory testing.  The controls stained appropriately.   IHC stains are performed on formalin fixed, paraffin embedded tissue using a 3,3"diaminobenzidine (DAB) chromogen and Leica Bond Autostainer System. The staining intensity of the nucleus is score manually and is reported as the percenta ge of tumor cell nuclei demonstrating specific nuclear staining. The specimens are fixed in 10% Neutral Formalin for at least 6 hours and up to 72hrs. These tests are validated on decalcified tissue. Results should be interpreted with caution given the possibility of false negative results on decalcified specimens. Antibody Clones are as follows ER-clone [id number], PR-clone 16, Ki67- clone MM1. Some of these immunohistochemical stains may have been developed and the performance characteristics determined by [facility].   Glucose, capillary   Collection Time: 05/22/24  5:38 PM  Result Value Ref Range   Glucose-Capillary 132 (H) 70 - 99 mg/dL  Glucose, capillary   Collection Time: 05/22/24  6:10 PM  Result Value Ref Range   Glucose-Capillary 173 (H) 70 - 99 mg/dL  Glucose, capillary   Collection Time: 05/22/24  7:37 PM  Result Value Ref Range    Glucose-Capillary 161 (H) 70 - 99 mg/dL  Glucose, capillary   Collection Time: 05/22/24 11:04 PM  Result Value Ref Range   Glucose-Capillary 245 (H) 70 - 99 mg/dL  Glucose, capillary   Collection Time: 05/23/24  3:58 AM  Result Value Ref Range   Glucose-Capillary 196 (H) 70 - 99 mg/dL  CBC   Collection Time: 05/23/24  4:29 AM  Result Value Ref Range   WBC 10.8 (H) 4.0 - 10.5 K/uL   RBC 4.69 4.22 - 5.81 MIL/uL   Hemoglobin 13.3 13.0 - 17.0 g/dL   HCT 16.1 09.6 - 04.5 %   MCV 92.8 80.0 - 100.0 fL   MCH 28.4 26.0 - 34.0 pg   MCHC 30.6 30.0 - 36.0 g/dL   RDW 40.9 81.1 - 91.4 %   Platelets 278 150 - 400 K/uL   nRBC 0.0 0.0 - 0.2 %  Comprehensive metabolic panel   Collection Time: 05/23/24  4:29 AM  Result Value Ref Range   Sodium 132 (L) 135 - 145 mmol/L   Potassium 4.0 3.5 - 5.1 mmol/L   Chloride 101 98 - 111 mmol/L   CO2 20 (L) 22 - 32 mmol/L   Glucose, Bld 207 (H) 70 - 99 mg/dL   BUN 28 (H) 8 - 23 mg/dL   Creatinine, Ser 7.82 0.61 - 1.24 mg/dL   Calcium 8.0 (L) 8.9 - 10.3 mg/dL   Total Protein 6.9 6.5 - 8.1 g/dL   Albumin 2.8 (L) 3.5 - 5.0 g/dL   AST 45 (H) 15 - 41 U/L   ALT 41 0 - 44 U/L   Alkaline Phosphatase 57 38 - 126 U/L   Total Bilirubin 0.9 0.0 - 1.2 mg/dL   GFR, Estimated >95 >62 mL/min   Anion gap 11 5 - 15  Glucose, capillary   Collection Time: 05/23/24  8:06 AM  Result Value Ref Range   Glucose-Capillary 179 (H) 70 - 99 mg/dL       12/29/863    7:84 PM 04/03/2024    9:28 AM 03/12/2024    9:36 AM 12/28/2023   11:01 AM 09/22/2023  8:32 AM  Depression screen PHQ 2/9  Decreased Interest 0 0 0 0   Down, Depressed, Hopeless 0 0 0 0   PHQ - 2 Score 0 0 0 0   Altered sleeping    0 0  Tired, decreased energy    0 0  Change in appetite    0 0  Feeling bad or failure about yourself     0 0  Trouble concentrating    0 0  Moving slowly or fidgety/restless    0 0  Suicidal thoughts    0 0  PHQ-9 Score    0   Difficult doing work/chores    Not difficult at all  Not difficult at all       12/28/2023   11:01 AM 09/22/2023    8:31 AM 06/08/2023    8:07 AM 12/02/2022    8:19 AM  GAD 7 : Generalized Anxiety Score  Nervous, Anxious, on Edge 0 0 0 0  Control/stop worrying 0 0 0 0  Worry too much - different things 0 0 0 0  Trouble relaxing 0 0 0 0  Restless 0 0 0 0  Easily annoyed or irritable 0 0 0 0  Afraid - awful might happen 0 0 0 0  Total GAD 7 Score 0 0 0 0  Anxiety Difficulty Not difficult at all Not difficult at all Not difficult at all Not difficult at all    Pertinent labs & imaging results that were available during my care of the patient were reviewed by me and considered in my medical decision making.  Assessment & Plan:  Randen was seen today for hospitalization follow-up.  Diagnoses and all orders for this visit:  1. S/P cholecystectomy (Primary) Stable. Continue to advance diet as tolerated. Monitor for complications. Labs as below. Will communicate results to patient once available. Will await results to determine next steps.  - CBC with Differential/Platelet - CMP14+EGFR  2. Renal calculus, right Will start medication as below.  Referral placed for follow up.  Patient to monitor for red flag symptoms.  - tamsulosin (FLOMAX) 0.4 MG CAPS capsule; Take 1 capsule (0.4 mg total) by mouth daily.  Dispense: 30 capsule; Refill: 3 - Ambulatory referral to Urology  Continue all other maintenance medications.  Follow up plan: Return if symptoms worsen or fail to improve.   Continue healthy lifestyle choices, including diet (rich in fruits, vegetables, and lean proteins, and low in salt and simple carbohydrates) and exercise (at least 30 minutes of moderate physical activity daily).  Written and verbal instructions provided   The above assessment and management plan was discussed with the patient. The patient verbalized understanding of and has agreed to the management plan. Patient is aware to call the clinic if they develop  any new symptoms or if symptoms persist or worsen. Patient is aware when to return to the clinic for a follow-up visit. Patient educated on when it is appropriate to go to the emergency department.   Jacqualyn Mates, DNP-FNP [facility] [address] [place], [place] [address] [phone]

## 2024-05-31 ENCOUNTER — Ambulatory Visit: Payer: Self-pay | Admitting: Family Medicine

## 2024-05-31 DIAGNOSIS — D75839 Thrombocytosis, unspecified: Secondary | ICD-10-CM

## 2024-05-31 LAB — CBC WITH DIFFERENTIAL/PLATELET
Basophils Absolute: 0.1 10*3/uL (ref 0.0–0.2)
Basos: 1 %
EOS (ABSOLUTE): 0 10*3/uL (ref 0.0–0.4)
Eos: 0 %
Hematocrit: 43.4 % (ref 37.5–51.0)
Hemoglobin: 13.5 g/dL (ref 13.0–17.7)
Immature Grans (Abs): 0.1 10*3/uL (ref 0.0–0.1)
Immature Granulocytes: 1 %
Lymphocytes Absolute: 2.6 10*3/uL (ref 0.7–3.1)
Lymphs: 22 %
MCH: 28.4 pg (ref 26.6–33.0)
MCHC: 31.1 g/dL — ABNORMAL LOW (ref 31.5–35.7)
MCV: 91 fL (ref 79–97)
Monocytes Absolute: 1 10*3/uL — ABNORMAL HIGH (ref 0.1–0.9)
Monocytes: 8 %
Neutrophils Absolute: 8 10*3/uL — ABNORMAL HIGH (ref 1.4–7.0)
Neutrophils: 68 %
Platelets: 561 10*3/uL — ABNORMAL HIGH (ref 150–450)
RBC: 4.75 x10E6/uL (ref 4.14–5.80)
RDW: 13.2 % (ref 11.6–15.4)
WBC: 11.7 10*3/uL — ABNORMAL HIGH (ref 3.4–10.8)

## 2024-05-31 LAB — CMP14+EGFR
ALT: 40 IU/L (ref 0–44)
AST: 29 IU/L (ref 0–40)
Albumin: 4 g/dL (ref 3.8–4.8)
Alkaline Phosphatase: 88 IU/L (ref 44–121)
BUN/Creatinine Ratio: 25 — ABNORMAL HIGH (ref 10–24)
BUN: 18 mg/dL (ref 8–27)
Bilirubin Total: <0.2 mg/dL (ref 0.0–1.2)
CO2: 19 mmol/L — ABNORMAL LOW (ref 20–29)
Calcium: 8.9 mg/dL (ref 8.6–10.2)
Chloride: 101 mmol/L (ref 96–106)
Creatinine, Ser: 0.73 mg/dL — ABNORMAL LOW (ref 0.76–1.27)
Globulin, Total: 2.8 g/dL (ref 1.5–4.5)
Glucose: 118 mg/dL — ABNORMAL HIGH (ref 70–99)
Potassium: 4.6 mmol/L (ref 3.5–5.2)
Sodium: 138 mmol/L (ref 134–144)
Total Protein: 6.8 g/dL (ref 6.0–8.5)
eGFR: 97 mL/min/{1.73_m2} (ref >59)

## 2024-05-31 NOTE — Progress Notes (Signed)
 Elevated WBC, neutrophils, monocytes, and platelets. Due to platelets, would like for him to repeat in 4 weeks. MCHC slightly decreased, will monitor.  Elevated BG, consistent with previous. Creatinine slightly decreased with elevated BUN/Crt ratio, maintain hydration, will continue to monitor. CO2 slightly low, not concerning at this time.

## 2024-06-11 DIAGNOSIS — G4733 Obstructive sleep apnea (adult) (pediatric): Secondary | ICD-10-CM | POA: Diagnosis not present

## 2024-06-25 ENCOUNTER — Other Ambulatory Visit

## 2024-06-25 DIAGNOSIS — D75839 Thrombocytosis, unspecified: Secondary | ICD-10-CM | POA: Diagnosis not present

## 2024-06-25 LAB — CBC WITH DIFFERENTIAL/PLATELET
Basophils Absolute: 0.1 10*3/uL (ref 0.0–0.2)
Basos: 1 %
EOS (ABSOLUTE): 0.6 10*3/uL — ABNORMAL HIGH (ref 0.0–0.4)
Eos: 7 %
Hematocrit: 43.8 % (ref 37.5–51.0)
Hemoglobin: 13.5 g/dL (ref 13.0–17.7)
Immature Grans (Abs): 0 10*3/uL (ref 0.0–0.1)
Immature Granulocytes: 0 %
Lymphocytes Absolute: 2.5 10*3/uL (ref 0.7–3.1)
Lymphs: 33 %
MCH: 28.3 pg (ref 26.6–33.0)
MCHC: 30.8 g/dL — ABNORMAL LOW (ref 31.5–35.7)
MCV: 92 fL (ref 79–97)
Monocytes Absolute: 0.8 10*3/uL (ref 0.1–0.9)
Monocytes: 11 %
Neutrophils Absolute: 3.7 10*3/uL (ref 1.4–7.0)
Neutrophils: 48 %
Platelets: 283 10*3/uL (ref 150–450)
RBC: 4.77 x10E6/uL (ref 4.14–5.80)
RDW: 13.8 % (ref 11.6–15.4)
WBC: 7.7 10*3/uL (ref 3.4–10.8)

## 2024-07-04 ENCOUNTER — Other Ambulatory Visit: Payer: Self-pay | Admitting: Medical Genetics

## 2024-07-08 ENCOUNTER — Other Ambulatory Visit (HOSPITAL_COMMUNITY)
Admission: RE | Admit: 2024-07-08 | Discharge: 2024-07-08 | Disposition: A | Discharge status: Home / Self Care | Payer: Self-pay | Source: Ambulatory Visit | Attending: Medical Genetics | Admitting: Medical Genetics

## 2024-07-11 ENCOUNTER — Encounter: Payer: Self-pay | Admitting: Family Medicine

## 2024-07-11 ENCOUNTER — Ambulatory Visit: Admitting: Family Medicine

## 2024-07-11 VITALS — BP 119/70 | HR 68 | Temp 98.0°F | Ht 65.0 in | Wt 185.0 lb

## 2024-07-11 DIAGNOSIS — Z7984 Long term (current) use of oral hypoglycemic drugs: Secondary | ICD-10-CM

## 2024-07-11 DIAGNOSIS — E1169 Type 2 diabetes mellitus with other specified complication: Secondary | ICD-10-CM | POA: Diagnosis not present

## 2024-07-11 DIAGNOSIS — E785 Hyperlipidemia, unspecified: Secondary | ICD-10-CM

## 2024-07-11 DIAGNOSIS — E1159 Type 2 diabetes mellitus with other circulatory complications: Secondary | ICD-10-CM

## 2024-07-11 DIAGNOSIS — I152 Hypertension secondary to endocrine disorders: Secondary | ICD-10-CM | POA: Diagnosis not present

## 2024-07-11 LAB — LIPID PANEL

## 2024-07-11 LAB — BAYER DCA HB A1C WAIVED: HB A1C (BAYER DCA - WAIVED): 6.7 % — ABNORMAL HIGH (ref 4.8–5.6)

## 2024-07-11 NOTE — Progress Notes (Signed)
 BP 119/70   Pulse 68   Temp 98 F (36.7 C)   Ht 5' 5 (1.651 m)   Wt 185 lb (83.9 kg)   SpO2 99%   BMI 30.79 kg/m    Subjective:   Patient ID: Austin Ortiz, male    DOB: [birth year], 73 y.o.   MRN: [id number]  HPI: Austin Ortiz is a 73 y.o. male presenting on 07/11/2024 for Medical Management of Chronic Issues, Diabetes, Hyperlipidemia, and Hypertension   HPI Type 2 diabetes mellitus Patient comes in today for recheck of his diabetes. Patient has been currently taking from the provide an Janumet . Patient is currently on an ACE inhibitor/ARB. Patient has not seen an ophthalmologist this year. Patient denies any new issues with their feet. The symptom started onset as an adult hypertension and hyperlipidemia ARE RELATED TO DM   Hypertension Patient is currently on lisinopril /hydrochlorothiazide , and their blood pressure today is 119/70. Patient denies any lightheadedness or dizziness. Patient denies headaches, blurred vision, chest pains, shortness of breath, or weakness. Denies any side effects from medication and is content with current medication.   Hyperlipidemia Patient is coming in for recheck of his hyperlipidemia. The patient is currently taking fish oils and simvastatin . They deny any issues with myalgias or history of liver damage from it. They deny any focal numbness or weakness or chest pain.   Relevant past medical, surgical, family and social history reviewed and updated as indicated. Interim medical history since our last visit reviewed. Allergies and medications reviewed and updated.  Review of Systems  Constitutional:  Negative for chills and fever.  Eyes:  Negative for visual disturbance.  Respiratory:  Negative for shortness of breath and wheezing.   Cardiovascular:  Negative for chest pain and leg swelling.  Musculoskeletal:  Negative for back pain and gait problem.  Skin:  Negative for rash.  Neurological:  Negative for dizziness and light-headedness.   All other systems reviewed and are negative.   Per HPI unless specifically indicated above   Allergies as of 07/11/2024       Reactions   Alpha-gal Other (See Comments)   Cetuximab Other (See Comments)   Pt denies         Medication List        Accurate as of July 11, 2024  9:50 AM. If you have any questions, ask your nurse or doctor.          aspirin 81 MG tablet Take 81 mg by mouth daily.   dapagliflozin  propanediol 10 MG Tabs tablet Commonly known as: Farxiga  Take 1 tablet (10 mg total) by mouth daily.   Fish Oil 600 MG Caps Take 1,200 mg by mouth daily.   glimepiride  4 MG tablet Commonly known as: AMARYL  Take 1 tablet (4 mg total) by mouth daily with breakfast.   glucosamine-chondroitin 500-400 MG tablet Take 1 tablet by mouth daily.   Janumet  50-1000 MG tablet Generic drug: sitaGLIPtin -metformin  Take 1 tablet by mouth 2 (two) times daily with a meal.   lisinopril -hydrochlorothiazide  10-12.5 MG tablet Commonly known as: ZESTORETIC  Take 1 tablet by mouth daily.   meloxicam  15 MG tablet Commonly known as: MOBIC  Take 1 tablet (15 mg total) by mouth daily.   multivitamin tablet Take 1 tablet by mouth daily.   omeprazole  40 MG capsule Commonly known as: PRILOSEC Take 1 capsule (40 mg total) by mouth daily.   sildenafil  20 MG tablet Commonly known as: REVATIO  TAKE 1 TO 3 TABLETS BY MOUTH AS NEEDED  simvastatin  40 MG tablet Commonly known as: ZOCOR  Take 1 tablet (40 mg total) by mouth every evening.   tamsulosin  0.4 MG Caps capsule Commonly known as: FLOMAX  Take 1 capsule (0.4 mg total) by mouth daily.         Objective:   BP 119/70   Pulse 68   Temp 98 F (36.7 C)   Ht 5' 5 (1.651 m)   Wt 185 lb (83.9 kg)   SpO2 99%   BMI 30.79 kg/m   Wt Readings from Last 3 Encounters:  07/11/24 185 lb (83.9 kg)  05/30/24 182 lb (82.6 kg)  05/22/24 186 lb 6.4 oz (84.5 kg)    Physical Exam Vitals and nursing note reviewed.   Constitutional:      General: He is not in acute distress.    Appearance: He is well-developed. He is not diaphoretic.  Eyes:     General: No scleral icterus.    Conjunctiva/sclera: Conjunctivae normal.  Neck:     Thyroid : No thyromegaly.  Cardiovascular:     Rate and Rhythm: Normal rate and regular rhythm.     Heart sounds: Normal heart sounds. No murmur heard. Pulmonary:     Effort: Pulmonary effort is normal. No respiratory distress.     Breath sounds: Normal breath sounds. No wheezing.  Musculoskeletal:        General: No swelling. Normal range of motion.     Cervical back: Neck supple.  Lymphadenopathy:     Cervical: No cervical adenopathy.  Skin:    General: Skin is warm and dry.     Findings: No rash.  Neurological:     Mental Status: He is alert and oriented to person, place, and time.     Coordination: Coordination normal.  Psychiatric:        Behavior: Behavior normal.       Assessment & Plan:   Problem List Items Addressed This Visit       Cardiovascular and Mediastinum   Hypertension associated with diabetes (HCC)   Relevant Orders   Bayer DCA Hb A1c Waived   CBC with Differential/Platelet   CMP14+EGFR   Lipid panel     Endocrine   T2DM (type 2 diabetes mellitus) (HCC) - Primary   Relevant Orders   Bayer DCA Hb A1c Waived   CBC with Differential/Platelet   CMP14+EGFR   Lipid panel   Hyperlipidemia associated with type 2 diabetes mellitus (HCC)   Relevant Orders   Bayer DCA Hb A1c Waived   CBC with Differential/Platelet   CMP14+EGFR   Lipid panel    A1c looks good at 6.7, blood pressure and everything else looks good.  No changes Follow up plan: Return in about 3 months (around 10/11/2024), or if symptoms worsen or fail to improve, for Diabetes recheck.  Counseling provided for all of the vaccine components Orders Placed This Encounter  Procedures   Bayer DCA Hb A1c Waived   CBC with Differential/Platelet   CMP14+EGFR   Lipid panel     Fonda Levins, MD [facility] 07/11/2024, 9:50 AM

## 2024-07-12 LAB — CMP14+EGFR
ALT: 37 IU/L (ref 0–44)
AST: 31 IU/L (ref 0–40)
Albumin: 4.2 g/dL (ref 3.8–4.8)
Alkaline Phosphatase: 51 IU/L (ref 44–121)
BUN/Creatinine Ratio: 20 (ref 10–24)
BUN: 16 mg/dL (ref 8–27)
Bilirubin Total: 0.2 mg/dL (ref 0.0–1.2)
CO2: 21 mmol/L (ref 20–29)
Calcium: 9 mg/dL (ref 8.6–10.2)
Chloride: 103 mmol/L (ref 96–106)
Creatinine, Ser: 0.81 mg/dL (ref 0.76–1.27)
Globulin, Total: 2.5 g/dL (ref 1.5–4.5)
Glucose: 141 mg/dL — ABNORMAL HIGH (ref 70–99)
Potassium: 4.7 mmol/L (ref 3.5–5.2)
Sodium: 138 mmol/L (ref 134–144)
Total Protein: 6.7 g/dL (ref 6.0–8.5)
eGFR: 94 mL/min/1.73 (ref >59)

## 2024-07-12 LAB — CBC WITH DIFFERENTIAL/PLATELET
Basophils Absolute: 0 x10E3/uL (ref 0.0–0.2)
Basos: 0 %
EOS (ABSOLUTE): 0.3 x10E3/uL (ref 0.0–0.4)
Eos: 4 %
Hematocrit: 43.3 % (ref 37.5–51.0)
Hemoglobin: 13.7 g/dL (ref 13.0–17.7)
Immature Grans (Abs): 0 x10E3/uL (ref 0.0–0.1)
Immature Granulocytes: 0 %
Lymphocytes Absolute: 2.6 x10E3/uL (ref 0.7–3.1)
Lymphs: 39 %
MCH: 28.5 pg (ref 26.6–33.0)
MCHC: 31.6 g/dL (ref 31.5–35.7)
MCV: 90 fL (ref 79–97)
Monocytes Absolute: 0.6 x10E3/uL (ref 0.1–0.9)
Monocytes: 9 %
Neutrophils Absolute: 3.3 x10E3/uL (ref 1.4–7.0)
Neutrophils: 48 %
Platelets: 293 x10E3/uL (ref 150–450)
RBC: 4.81 x10E6/uL (ref 4.14–5.80)
RDW: 13.6 % (ref 11.6–15.4)
WBC: 6.8 x10E3/uL (ref 3.4–10.8)

## 2024-07-12 LAB — LIPID PANEL
Chol/HDL Ratio: 4 ratio (ref 0.0–5.0)
Cholesterol, Total: 121 mg/dL (ref 100–199)
HDL: 30 mg/dL — ABNORMAL LOW (ref >39)
LDL Chol Calc (NIH): 52 mg/dL (ref 0–99)
Triglycerides: 244 mg/dL — ABNORMAL HIGH (ref 0–149)
VLDL Cholesterol Cal: 39 mg/dL (ref 5–40)

## 2024-07-15 LAB — GENECONNECT MOLECULAR SCREEN: Genetic Analysis Overall Interpretation: NEGATIVE

## 2024-07-19 ENCOUNTER — Ambulatory Visit: Payer: Self-pay | Admitting: Family Medicine

## 2024-07-22 NOTE — Progress Notes (Signed)
 Pharmacy Medication Assistance Program Note    07/22/2024  Patient ID: Austin Ortiz, male  DOB: [birth year], 73 y.o.  MRN:  [id number]     07/22/2024  Outreach Medication Two  Manufacturer Medication Two Merck  Merck Drugs Janumet   Type of Psychiatrist Approved

## 2024-09-04 DIAGNOSIS — G4733 Obstructive sleep apnea (adult) (pediatric): Secondary | ICD-10-CM | POA: Diagnosis not present

## 2024-09-05 ENCOUNTER — Telehealth: Payer: Self-pay

## 2024-09-05 NOTE — Telephone Encounter (Signed)
 Received a refill request fax from [employer] patient assistance company for patients FARXIGA  medication.   Please send a 90 day supply (with refills) to [facility] if applicable. Thanks!

## 2024-09-11 ENCOUNTER — Telehealth: Payer: Self-pay | Admitting: Pharmacist

## 2024-09-11 DIAGNOSIS — E1169 Type 2 diabetes mellitus with other specified complication: Secondary | ICD-10-CM

## 2024-09-11 MED ORDER — DAPAGLIFLOZIN PROPANEDIOL 10 MG PO TABS: 10.0000 mg | ORAL_TABLET | Freq: Every day | ORAL | 4 refills | Status: AC

## 2024-09-11 NOTE — Telephone Encounter (Signed)
   Patient enrolled in the [place] patient assistance program for Farxiga .  Updated RX escribed to [facility] mail order (pharmacy for AZ[employer] patient assistance).  Patient is stable on current regimen.  He will need to re-enroll for 2026 in November.  Jaeceon Michelin Dattero Ica Daye, PharmD, BCACP, CPP Clinical Pharmacist, [facility]

## 2024-10-16 ENCOUNTER — Ambulatory Visit: Admitting: Family Medicine

## 2024-10-16 ENCOUNTER — Encounter: Payer: Self-pay | Admitting: Family Medicine

## 2024-10-16 ENCOUNTER — Telehealth: Payer: Self-pay

## 2024-10-16 VITALS — BP 132/73 | HR 66 | Temp 96.5°F | Ht 65.0 in | Wt 187.0 lb

## 2024-10-16 DIAGNOSIS — E1169 Type 2 diabetes mellitus with other specified complication: Secondary | ICD-10-CM

## 2024-10-16 DIAGNOSIS — Z7984 Long term (current) use of oral hypoglycemic drugs: Secondary | ICD-10-CM

## 2024-10-16 DIAGNOSIS — E785 Hyperlipidemia, unspecified: Secondary | ICD-10-CM | POA: Diagnosis not present

## 2024-10-16 DIAGNOSIS — E1159 Type 2 diabetes mellitus with other circulatory complications: Secondary | ICD-10-CM

## 2024-10-16 DIAGNOSIS — I152 Hypertension secondary to endocrine disorders: Secondary | ICD-10-CM | POA: Diagnosis not present

## 2024-10-16 DIAGNOSIS — Z23 Encounter for immunization: Secondary | ICD-10-CM

## 2024-10-16 LAB — BAYER DCA HB A1C WAIVED: HB A1C (BAYER DCA - WAIVED): 7 % — ABNORMAL HIGH (ref 4.8–5.6)

## 2024-10-16 NOTE — Telephone Encounter (Signed)
 Alright. Paperwork is on [employer].

## 2024-10-16 NOTE — Telephone Encounter (Signed)
 Pt seen for 67m f/u with Dr. Maryanne today. He brought in patient assistance forms to complete. Per Dr. Maryanne Clarity or [place] need to complete. Forms placed on Julie's desk. Dettinger's nurse has a copy for reference.

## 2024-10-16 NOTE — Telephone Encounter (Signed)
   Sent re-enrollment text link to patient via [employer] site.

## 2024-10-16 NOTE — Progress Notes (Signed)
 BP 132/73   Pulse 66   Temp (!) 96.5 F (35.8 C)   Ht 5' 5 (1.651 m)   Wt 187 lb (84.8 kg)   SpO2 98%   BMI 31.12 kg/m    Subjective:   Patient ID: Austin Ortiz, male    DOB: 1951-10-13, 73 y.o.   MRN: [id number]  HPI: Austin Ortiz is a 73 y.o. male presenting on 10/16/2024 for Medical Management of Chronic Issues and Diabetes   Discussed the use of AI scribe software for clinical note transcription with the patient, who gave verbal consent to proceed.  History of Present Illness   Austin Ortiz is a 73 year old male with diabetes who presents for a recheck of his condition.  Hyperglycemia and diabetes management - Diabetes managed with Farxiga , glimepiride , and Janumet  - Recent HbA1c 7.0 - Monitors blood glucose levels with occasional lapses in routine - Diet generally well-maintained with some deviations  Hypertension and hyperlipidemia - Hypertension managed with lisinopril  - Recent blood pressure 132/73 - Continues simvastatin  for hyperlipidemia  Gastroesophageal reflux and esophageal scarring - History of esophageal scarring with prior esophageal dilation - Takes omeprazole  for heartburn - Occasional nocturnal discomfort suggestive of silent reflux - No dysphagia  Post-cholecystectomy status - History of cholecystectomy for gallstones - No post-surgical complications  Post-appendectomy status - History of appendectomy at age 14          Relevant past medical, surgical, family and social history reviewed and updated as indicated. Interim medical history since our last visit reviewed. Allergies and medications reviewed and updated.  Review of Systems  Constitutional:  Negative for chills and fever.  Eyes:  Negative for visual disturbance.  Respiratory:  Negative for shortness of breath and wheezing.   Cardiovascular:  Negative for chest pain and leg swelling.  Musculoskeletal:  Negative for gait problem.  Skin:  Negative for rash.   All other systems reviewed and are negative.   Per HPI unless specifically indicated above   Allergies as of 10/16/2024       Reactions   Alpha-gal Other (See Comments)   Cetuximab Other (See Comments)   Pt denies         Medication List        Accurate as of October 16, 2024  8:46 AM. If you have any questions, ask your nurse or doctor.          aspirin 81 MG tablet Take 81 mg by mouth daily.   dapagliflozin  propanediol 10 MG Tabs tablet Commonly known as: Farxiga  Take 1 tablet (10 mg total) by mouth daily.   Fish Oil 600 MG Caps Take 1,200 mg by mouth daily.   glimepiride  4 MG tablet Commonly known as: AMARYL  Take 1 tablet (4 mg total) by mouth daily with breakfast.   glucosamine-chondroitin 500-400 MG tablet Take 1 tablet by mouth daily.   Janumet  50-1000 MG tablet Generic drug: sitaGLIPtin -metformin  Take 1 tablet by mouth 2 (two) times daily with a meal.   lisinopril -hydrochlorothiazide  10-12.5 MG tablet Commonly known as: ZESTORETIC  Take 1 tablet by mouth daily.   meloxicam  15 MG tablet Commonly known as: MOBIC  Take 1 tablet (15 mg total) by mouth daily.   multivitamin tablet Take 1 tablet by mouth daily.   omeprazole  40 MG capsule Commonly known as: PRILOSEC Take 1 capsule (40 mg total) by mouth daily.   sildenafil  20 MG tablet Commonly known as: REVATIO  TAKE 1 TO 3 TABLETS BY MOUTH AS NEEDED  simvastatin  40 MG tablet Commonly known as: ZOCOR  Take 1 tablet (40 mg total) by mouth every evening.   tamsulosin  0.4 MG Caps capsule Commonly known as: FLOMAX  Take 1 capsule (0.4 mg total) by mouth daily.         Objective:   BP 132/73   Pulse 66   Temp (!) 96.5 F (35.8 C)   Ht 5' 5 (1.651 m)   Wt 187 lb (84.8 kg)   SpO2 98%   BMI 31.12 kg/m   Wt Readings from Last 3 Encounters:  10/16/24 187 lb (84.8 kg)  07/11/24 185 lb (83.9 kg)  05/30/24 182 lb (82.6 kg)    Physical Exam Physical Exam   VITALS: BP- 132/73 NECK:  Thyroid  normal on palpation. CHEST: Lungs clear to auscultation bilaterally. CARDIOVASCULAR: Regular heart rate and rhythm, no murmurs.         Assessment & Plan:   Problem List Items Addressed This Visit       Cardiovascular and Mediastinum   Hypertension associated with diabetes (HCC)     Endocrine   T2DM (type 2 diabetes mellitus) (HCC) - Primary   Relevant Orders   Bayer DCA Hb A1c Waived   Hyperlipidemia associated with type 2 diabetes mellitus (HCC)        Type 2 diabetes mellitus Type 2 diabetes with A1c of 7.0%. - Continue Farxiga , glimepiride , and Janumet . - Complete form for Janumet  renewal. - Emphasized dietary management, especially during holidays. - Encouraged physical activity post-holiday meals.  Osteoarthritis of knee Discussed knee replacement and hyaluronic acid injections as options. - Consider referral to orthopedics for hyaluronic acid injections. - Ensure orthopedic consultation is covered by insurance. - Discuss potential knee replacement if conservative measures fail.  Hypertension Hypertension well-controlled with lisinopril . - Continue lisinopril .  Hyperlipidemia Hyperlipidemia managed with simvastatin . - Continue simvastatin .  Gastroesophageal reflux disease GERD currently asymptomatic. History of esophageal dilation. - Monitor for symptoms of dysphagia or heartburn. - Consider esophageal dilation if symptoms recur.        Follow up plan: Return in about 3 months (around 01/16/2025), or if symptoms worsen or fail to improve, for diabetes.  Counseling provided for all of the vaccine components Orders Placed This Encounter  Procedures   Bayer DCA Hb A1c Waived    Fonda Levins, MD [facility] 10/16/2024, 8:46 AM

## 2024-10-17 NOTE — Telephone Encounter (Signed)
 Patient pages complete.   Awaiting provider pages to be completed.

## 2024-10-17 NOTE — Telephone Encounter (Signed)
 Patient coming back by office to complete missing signatures on application.

## 2024-10-18 DIAGNOSIS — M25561 Pain in right knee: Secondary | ICD-10-CM | POA: Diagnosis not present

## 2024-10-18 DIAGNOSIS — M1711 Unilateral primary osteoarthritis, right knee: Secondary | ICD-10-CM | POA: Diagnosis not present

## 2024-10-18 DIAGNOSIS — M25562 Pain in left knee: Secondary | ICD-10-CM | POA: Diagnosis not present

## 2024-10-18 DIAGNOSIS — M170 Bilateral primary osteoarthritis of knee: Secondary | ICD-10-CM | POA: Diagnosis not present

## 2024-10-18 DIAGNOSIS — M1712 Unilateral primary osteoarthritis, left knee: Secondary | ICD-10-CM | POA: Diagnosis not present

## 2024-10-18 NOTE — Telephone Encounter (Signed)
 PAP: Application for Janumet  has been submitted to [employer], via fax 10/17/24

## 2024-10-22 DIAGNOSIS — G4733 Obstructive sleep apnea (adult) (pediatric): Secondary | ICD-10-CM | POA: Diagnosis not present

## 2024-10-28 ENCOUNTER — Other Ambulatory Visit: Payer: Self-pay | Admitting: *Deleted

## 2024-10-29 ENCOUNTER — Other Ambulatory Visit (HOSPITAL_COMMUNITY): Payer: Self-pay

## 2024-12-13 DIAGNOSIS — M170 Bilateral primary osteoarthritis of knee: Secondary | ICD-10-CM | POA: Diagnosis not present

## 2024-12-13 DIAGNOSIS — M1712 Unilateral primary osteoarthritis, left knee: Secondary | ICD-10-CM | POA: Diagnosis not present

## 2024-12-17 NOTE — Telephone Encounter (Signed)
 PAP: Patient assistance application for Farxiga  has been approved by PAP Companies: [employer] until 12/25/25.   Medication should be delivered to: Home.   For further shipping updates, please contact [employer] ([employer]) at [phone].   Patient ID is: [id number]

## 2024-12-17 NOTE — Telephone Encounter (Signed)
 PAP: Patient assistance re-enrollment  application for Janumet  has been approved by PAP Companies: [employer]ck until 11/03/25.   Medication should be delivered to: Home.   For further shipping updates, please contact [employer] at [phone].

## 2025-01-16 ENCOUNTER — Ambulatory Visit: Payer: Self-pay | Admitting: Family Medicine

## 2025-01-16 ENCOUNTER — Encounter: Payer: Self-pay | Admitting: Family Medicine

## 2025-01-16 VITALS — BP 147/69 | HR 73 | Ht 65.0 in | Wt 183.0 lb

## 2025-01-16 DIAGNOSIS — I152 Hypertension secondary to endocrine disorders: Secondary | ICD-10-CM

## 2025-01-16 DIAGNOSIS — E1159 Type 2 diabetes mellitus with other circulatory complications: Secondary | ICD-10-CM | POA: Diagnosis not present

## 2025-01-16 DIAGNOSIS — Z7984 Long term (current) use of oral hypoglycemic drugs: Secondary | ICD-10-CM | POA: Diagnosis not present

## 2025-01-16 DIAGNOSIS — E1169 Type 2 diabetes mellitus with other specified complication: Secondary | ICD-10-CM

## 2025-01-16 DIAGNOSIS — E785 Hyperlipidemia, unspecified: Secondary | ICD-10-CM

## 2025-01-16 LAB — BAYER DCA HB A1C WAIVED: HB A1C (BAYER DCA - WAIVED): 7.2 % — ABNORMAL HIGH (ref 4.8–5.6)

## 2025-01-16 LAB — LIPID PANEL

## 2025-01-16 NOTE — Progress Notes (Signed)
 "  BP (!) 147/69   Pulse 73   Ht 5' 5 (1.651 m)   Wt 183 lb (83 kg)   SpO2 98%   BMI 30.45 kg/m    Subjective:   Patient ID: Austin Ortiz, male    DOB: 08-Feb-1951, 74 y.o.   MRN: [id number]  HPI: Austin Ortiz is a 74 y.o. male presenting on 01/16/2025 for Medical Management of Chronic Issues and Diabetes   Discussed the use of AI scribe software for clinical note transcription with the patient, who gave verbal consent to proceed.  History of Present Illness   Austin Ortiz is a 74 year old male with knee osteoarthritis and diabetes who presents for a recheck of his conditions.  Knee pain and osteoarthritis - Knee osteoarthritis with recent intra-articular injections resulting in significant symptom relief - Describes condition as 'bone on bone' - Considering surgical intervention, possibly one knee at a time, with first surgery planned for fall to accommodate work with hay during summer [employer] requires certain treatments prior to surgery; recent knee x-rays obtained - Some days able to ambulate well, but anticipates increased difficulty and need for handicap parking permit post-surgery  Glycemic control - Diabetes mellitus with generally well-controlled blood glucose levels - Recent spike in blood sugar following knee injections - Current medications: Farxiga , glimepiride , Janumet   Blood pressure management - Monitoring blood pressure; most recent reading 147/69 mmHg - Continues antihypertensive therapy with lisinopril  and hydrochlorothiazide           Relevant past medical, surgical, family and social history reviewed and updated as indicated. Interim medical history since our last visit reviewed. Allergies and medications reviewed and updated.  Review of Systems  Constitutional:  Negative for chills and fever.  Respiratory:  Negative for shortness of breath and wheezing.   Cardiovascular:  Negative for chest pain and leg swelling.  Musculoskeletal:   Negative for back pain and gait problem.  Skin:  Negative for rash.  All other systems reviewed and are negative.   Per HPI unless specifically indicated above   Allergies as of 01/16/2025       Reactions   Alpha-gal Other (See Comments)   Cetuximab Other (See Comments)   Pt denies         Medication List        Accurate as of January 16, 2025 11:46 AM. If you have any questions, ask your nurse or doctor.          aspirin 81 MG tablet Take 81 mg by mouth daily.   dapagliflozin  propanediol 10 MG Tabs tablet Commonly known as: Farxiga  Take 1 tablet (10 mg total) by mouth daily.   Fish Oil 600 MG Caps Take 1,200 mg by mouth daily.   glimepiride  4 MG tablet Commonly known as: AMARYL  Take 1 tablet (4 mg total) by mouth daily with breakfast.   glucosamine-chondroitin 500-400 MG tablet Take 1 tablet by mouth daily.   Janumet  50-1000 MG tablet Generic drug: sitaGLIPtin -metformin  Take 1 tablet by mouth 2 (two) times daily with a meal.   lisinopril -hydrochlorothiazide  10-12.5 MG tablet Commonly known as: ZESTORETIC  Take 1 tablet by mouth daily.   meloxicam  15 MG tablet Commonly known as: MOBIC  Take 1 tablet (15 mg total) by mouth daily.   multivitamin tablet Take 1 tablet by mouth daily.   omeprazole  40 MG capsule Commonly known as: PRILOSEC Take 1 capsule (40 mg total) by mouth daily.   sildenafil  20 MG tablet Commonly known as: REVATIO   TAKE 1 TO 3 TABLETS BY MOUTH AS NEEDED   simvastatin  40 MG tablet Commonly known as: ZOCOR  Take 1 tablet (40 mg total) by mouth every evening.   tamsulosin  0.4 MG Caps capsule Commonly known as: FLOMAX  Take 1 capsule (0.4 mg total) by mouth daily.         Objective:   BP (!) 147/69   Pulse 73   Ht 5' 5 (1.651 m)   Wt 183 lb (83 kg)   SpO2 98%   BMI 30.45 kg/m   Wt Readings from Last 3 Encounters:  01/16/25 183 lb (83 kg)  10/16/24 187 lb (84.8 kg)  07/11/24 185 lb (83.9 kg)    Physical  Exam Physical Exam   VITALS: BP- 147/69 NECK: Thyroid  no thyromegaly, no nodules. CHEST: Lungs clear to auscultation bilaterally. CARDIOVASCULAR: Heart regular rate and rhythm, no murmurs. EXTREMITIES: Extremities with good pulses bilaterally, no edema.         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 Blood pressure 147/69 mmHg. - Continue Farxiga , glimepiride , and Janumet . - Monitor blood sugar levels at home. - Avoid salty foods. - Continue lisinopril  and hydrochlorothiazide .  Bilateral primary osteoarthritis of knee Recent knee injections provided significant relief. Considering gel injections and potential surgery. Insurance requires initial injections before surgery. Advised to schedule both knees for treatment. - Schedule bilateral knee surgery if recommended by orthopedic specialist.     A1c 7.2, mainly focus on diet and trying to get down.  Injections probably played a difference in there as well     Follow up plan: Return in about 3 months (around 04/16/2025), or if symptoms worsen or fail to improve, for Diabetes.  Counseling provided for all of the vaccine components Orders Placed This Encounter  Procedures   CBC with Differential/Platelet   CMP14+EGFR   Lipid panel   TSH   PSA, total and  free   Bayer DCA Hb A1c Waived    Fonda Levins, MD [facility] 01/16/2025, 11:46 AM     "

## 2025-01-17 LAB — CBC WITH DIFFERENTIAL/PLATELET
Basophils Absolute: 0 x10E3/uL (ref 0.0–0.2)
Basos: 0 %
EOS (ABSOLUTE): 0 x10E3/uL (ref 0.0–0.4)
Eos: 0 %
Hematocrit: 45.8 % (ref 37.5–51.0)
Hemoglobin: 14.4 g/dL (ref 13.0–17.7)
Immature Grans (Abs): 0 x10E3/uL (ref 0.0–0.1)
Immature Granulocytes: 0 %
Lymphocytes Absolute: 2.3 x10E3/uL (ref 0.7–3.1)
Lymphs: 33 %
MCH: 27.4 pg (ref 26.6–33.0)
MCHC: 31.4 g/dL — ABNORMAL LOW (ref 31.5–35.7)
MCV: 87 fL (ref 79–97)
Monocytes Absolute: 0.7 x10E3/uL (ref 0.1–0.9)
Monocytes: 9 %
Neutrophils Absolute: 4 x10E3/uL (ref 1.4–7.0)
Neutrophils: 58 %
Platelets: 312 x10E3/uL (ref 150–450)
RBC: 5.26 x10E6/uL (ref 4.14–5.80)
RDW: 14.4 % (ref 11.6–15.4)
WBC: 7 x10E3/uL (ref 3.4–10.8)

## 2025-01-17 LAB — CMP14+EGFR
ALT: 44 IU/L (ref 0–44)
AST: 34 IU/L (ref 0–40)
Albumin: 4.4 g/dL (ref 3.8–4.8)
Alkaline Phosphatase: 52 IU/L (ref 47–123)
BUN/Creatinine Ratio: 26 — AB (ref 10–24)
BUN: 19 mg/dL (ref 8–27)
Bilirubin Total: 0.3 mg/dL (ref 0.0–1.2)
CO2: 22 mmol/L (ref 20–29)
Calcium: 9.5 mg/dL (ref 8.6–10.2)
Chloride: 101 mmol/L (ref 96–106)
Creatinine, Ser: 0.73 mg/dL — AB (ref 0.76–1.27)
Globulin, Total: 2.6 g/dL (ref 1.5–4.5)
Glucose: 110 mg/dL — AB (ref 70–99)
Potassium: 5 mmol/L (ref 3.5–5.2)
Sodium: 139 mmol/L (ref 134–144)
Total Protein: 7 g/dL (ref 6.0–8.5)
eGFR: 97 mL/min/1.73

## 2025-01-17 LAB — LIPID PANEL
Cholesterol, Total: 115 mg/dL (ref 100–199)
HDL: 39 mg/dL — AB
LDL CALC COMMENT:: 2.9 ratio (ref 0.0–5.0)
LDL Chol Calc (NIH): 55 mg/dL (ref 0–99)
Triglycerides: 113 mg/dL (ref 0–149)
VLDL Cholesterol Cal: 21 mg/dL (ref 5–40)

## 2025-01-17 LAB — TSH: TSH: 4.17 u[IU]/mL (ref 0.450–4.500)

## 2025-01-17 LAB — PSA, TOTAL AND FREE
PSA, Free Pct: 52.9 %
PSA, Free: 0.37 ng/mL
Prostate Specific Ag, Serum: 0.7 ng/mL (ref 0.0–4.0)

## 2025-01-21 NOTE — Progress Notes (Signed)
 VICTORHUGO PREIS                                          MRN: [id number]   01/21/2025   The [facility] Quality Team Specialist reviewed this patient medical record for the purposes of chart review for care gap closure. The following were reviewed: abstraction for care gap closure-diabetic eye exam.    [facility]BCI Quality Team

## 2025-01-23 ENCOUNTER — Ambulatory Visit: Payer: Self-pay | Admitting: Family Medicine

## 2025-01-27 ENCOUNTER — Telehealth: Payer: Self-pay | Admitting: Pharmacy Technician

## 2025-01-27 ENCOUNTER — Other Ambulatory Visit (HOSPITAL_COMMUNITY): Payer: Self-pay

## 2025-01-27 NOTE — Telephone Encounter (Signed)
 Pharmacy Patient Advocate Encounter   Received notification from Onbase CMM KEY that prior authorization for Sildenafil  Citrate (PAH) 20MG  tablets is required/requested.   Insurance verification completed.   The patient is insured through [employer].   Patient fills this medication using GoodRx. #30 tablets is $17.00. No further PA needed at this time.

## 2025-03-13 ENCOUNTER — Ambulatory Visit: Payer: Self-pay

## 2025-04-24 ENCOUNTER — Ambulatory Visit: Admitting: Family Medicine
